# Patient Record
Sex: Male | Born: 1950 | Race: White | Hispanic: No | Smoking: Former smoker
Health system: Southern US, Community
[De-identification: ages and names within clinical notes are randomized; demographics above are authoritative.]

## PROBLEM LIST (undated history)

## (undated) DIAGNOSIS — Z974 Presence of external hearing-aid: Secondary | ICD-10-CM

## (undated) DIAGNOSIS — E662 Morbid (severe) obesity with alveolar hypoventilation: Secondary | ICD-10-CM

## (undated) DIAGNOSIS — I2510 Atherosclerotic heart disease of native coronary artery without angina pectoris: Secondary | ICD-10-CM

## (undated) DIAGNOSIS — G4733 Obstructive sleep apnea (adult) (pediatric): Secondary | ICD-10-CM

## (undated) DIAGNOSIS — I10 Essential (primary) hypertension: Secondary | ICD-10-CM

## (undated) DIAGNOSIS — K219 Gastro-esophageal reflux disease without esophagitis: Secondary | ICD-10-CM

## (undated) DIAGNOSIS — E119 Type 2 diabetes mellitus without complications: Secondary | ICD-10-CM

## (undated) DIAGNOSIS — J449 Chronic obstructive pulmonary disease, unspecified: Secondary | ICD-10-CM

## (undated) DIAGNOSIS — M109 Gout, unspecified: Secondary | ICD-10-CM

## (undated) DIAGNOSIS — I4891 Unspecified atrial fibrillation: Secondary | ICD-10-CM

## (undated) DIAGNOSIS — E785 Hyperlipidemia, unspecified: Secondary | ICD-10-CM

## (undated) DIAGNOSIS — I5032 Chronic diastolic (congestive) heart failure: Secondary | ICD-10-CM

## (undated) HISTORY — PX: BARIATRIC SURGERY: SHX1103

## (undated) HISTORY — DX: Essential (primary) hypertension: I10

## (undated) HISTORY — PX: ATRIAL ABLATION SURGERY: SHX560

## (undated) HISTORY — DX: Hyperlipidemia, unspecified: E78.5

## (undated) HISTORY — DX: Presence of external hearing-aid: Z97.4

## (undated) HISTORY — PX: HIP FRACTURE SURGERY: SHX118

## (undated) HISTORY — PX: CARDIAC CATHETERIZATION: SHX172

## (undated) HISTORY — DX: Atherosclerotic heart disease of native coronary artery without angina pectoris: I25.10

## (undated) HISTORY — PX: STOMACH SURGERY: SHX791

---

## 2004-06-24 ENCOUNTER — Emergency Department: Payer: Self-pay | Admitting: Emergency Medicine

## 2004-07-11 ENCOUNTER — Emergency Department: Payer: Self-pay | Admitting: Internal Medicine

## 2005-06-30 ENCOUNTER — Other Ambulatory Visit: Payer: Self-pay

## 2005-06-30 ENCOUNTER — Emergency Department: Payer: Self-pay | Admitting: Emergency Medicine

## 2006-08-25 ENCOUNTER — Emergency Department: Payer: Self-pay | Admitting: Emergency Medicine

## 2006-09-12 HISTORY — PX: CARDIAC CATHETERIZATION: SHX172

## 2006-12-14 ENCOUNTER — Inpatient Hospital Stay: Payer: Self-pay | Admitting: *Deleted

## 2006-12-14 ENCOUNTER — Other Ambulatory Visit: Payer: Self-pay

## 2006-12-15 ENCOUNTER — Other Ambulatory Visit: Payer: Self-pay

## 2007-06-02 ENCOUNTER — Emergency Department: Payer: Self-pay | Admitting: Emergency Medicine

## 2007-06-02 ENCOUNTER — Other Ambulatory Visit: Payer: Self-pay

## 2007-09-09 ENCOUNTER — Emergency Department: Payer: Self-pay | Admitting: Emergency Medicine

## 2007-11-06 ENCOUNTER — Other Ambulatory Visit: Payer: Self-pay

## 2007-11-07 ENCOUNTER — Other Ambulatory Visit: Payer: Self-pay

## 2007-11-07 ENCOUNTER — Inpatient Hospital Stay: Payer: Self-pay | Admitting: Internal Medicine

## 2007-11-07 ENCOUNTER — Ambulatory Visit: Payer: Self-pay | Admitting: Cardiology

## 2008-03-17 ENCOUNTER — Emergency Department: Payer: Self-pay | Admitting: Emergency Medicine

## 2008-03-17 ENCOUNTER — Other Ambulatory Visit: Payer: Self-pay

## 2008-05-15 ENCOUNTER — Emergency Department: Payer: Self-pay | Admitting: Emergency Medicine

## 2008-05-15 ENCOUNTER — Other Ambulatory Visit: Payer: Self-pay

## 2008-06-10 ENCOUNTER — Emergency Department: Payer: Self-pay | Admitting: Emergency Medicine

## 2008-06-10 ENCOUNTER — Other Ambulatory Visit: Payer: Self-pay

## 2008-06-24 ENCOUNTER — Emergency Department: Payer: Self-pay | Admitting: Emergency Medicine

## 2008-08-06 ENCOUNTER — Inpatient Hospital Stay: Payer: Self-pay | Admitting: Internal Medicine

## 2008-08-18 ENCOUNTER — Emergency Department: Payer: Self-pay | Admitting: Emergency Medicine

## 2009-02-23 DIAGNOSIS — K219 Gastro-esophageal reflux disease without esophagitis: Secondary | ICD-10-CM | POA: Insufficient documentation

## 2009-04-04 ENCOUNTER — Emergency Department: Payer: Self-pay | Admitting: Emergency Medicine

## 2009-06-01 ENCOUNTER — Emergency Department: Payer: Self-pay | Admitting: Emergency Medicine

## 2009-12-29 ENCOUNTER — Emergency Department: Payer: Self-pay | Admitting: Unknown Physician Specialty

## 2010-03-21 ENCOUNTER — Emergency Department: Payer: Self-pay | Admitting: Internal Medicine

## 2010-07-29 ENCOUNTER — Emergency Department: Payer: Self-pay | Admitting: Emergency Medicine

## 2010-08-26 ENCOUNTER — Emergency Department: Payer: Self-pay | Admitting: Emergency Medicine

## 2010-08-29 ENCOUNTER — Emergency Department: Payer: Self-pay | Admitting: Emergency Medicine

## 2010-09-12 HISTORY — PX: CHOLECYSTECTOMY: SHX55

## 2010-11-10 ENCOUNTER — Emergency Department: Payer: Medicare Other | Admitting: Emergency Medicine

## 2010-11-12 ENCOUNTER — Emergency Department: Payer: Medicare Other | Admitting: Emergency Medicine

## 2010-12-22 ENCOUNTER — Ambulatory Visit: Payer: Medicare Other | Admitting: Surgery

## 2010-12-29 ENCOUNTER — Ambulatory Visit: Payer: Medicare Other | Admitting: Surgery

## 2011-02-27 ENCOUNTER — Emergency Department: Payer: Medicare Other | Admitting: Emergency Medicine

## 2011-03-11 ENCOUNTER — Observation Stay: Payer: Medicare Other | Admitting: Internal Medicine

## 2011-04-13 DIAGNOSIS — I4891 Unspecified atrial fibrillation: Secondary | ICD-10-CM

## 2011-04-13 HISTORY — DX: Unspecified atrial fibrillation: I48.91

## 2011-05-16 ENCOUNTER — Emergency Department: Payer: Medicare Other | Admitting: Emergency Medicine

## 2011-06-14 ENCOUNTER — Ambulatory Visit: Payer: Medicare Other | Admitting: Internal Medicine

## 2011-06-14 DIAGNOSIS — R072 Precordial pain: Secondary | ICD-10-CM

## 2011-06-15 ENCOUNTER — Inpatient Hospital Stay: Payer: Medicare Other | Admitting: Internal Medicine

## 2011-06-15 DIAGNOSIS — R079 Chest pain, unspecified: Secondary | ICD-10-CM

## 2011-06-15 DIAGNOSIS — I517 Cardiomegaly: Secondary | ICD-10-CM

## 2011-06-28 ENCOUNTER — Encounter: Payer: Self-pay | Admitting: Cardiovascular Disease

## 2011-06-28 ENCOUNTER — Ambulatory Visit (INDEPENDENT_AMBULATORY_CARE_PROVIDER_SITE_OTHER): Payer: Medicare Other | Admitting: Cardiovascular Disease

## 2011-06-28 DIAGNOSIS — I10 Essential (primary) hypertension: Secondary | ICD-10-CM | POA: Insufficient documentation

## 2011-06-28 DIAGNOSIS — E669 Obesity, unspecified: Secondary | ICD-10-CM

## 2011-06-28 DIAGNOSIS — R079 Chest pain, unspecified: Secondary | ICD-10-CM | POA: Insufficient documentation

## 2011-06-28 DIAGNOSIS — E785 Hyperlipidemia, unspecified: Secondary | ICD-10-CM

## 2011-06-28 DIAGNOSIS — I2510 Atherosclerotic heart disease of native coronary artery without angina pectoris: Secondary | ICD-10-CM

## 2011-06-28 DIAGNOSIS — R609 Edema, unspecified: Secondary | ICD-10-CM

## 2011-06-28 MED ORDER — POTASSIUM CHLORIDE ER 10 MEQ PO TBCR: 10.0000 meq | EXTENDED_RELEASE_TABLET | Freq: Two times a day (BID) | ORAL | Status: DC

## 2011-06-28 MED ORDER — ENALAPRIL MALEATE 10 MG PO TABS: 10.0000 mg | ORAL_TABLET | Freq: Two times a day (BID) | ORAL | Status: DC

## 2011-06-28 NOTE — Assessment & Plan Note (Signed)
Blood pressure is elevated. We have suggested he increase his enalapril to 10 mg b.i.d..

## 2011-06-28 NOTE — Progress Notes (Signed)
Patient ID: Aaron Ball, male    DOB: Sep 07, 1951, 60 y.o.   MRN: [id number]  HPI Comments: Aaron Ball is a 60 year old gentleman with a history of coronary artery disease, stent to his RCA in the proximal region in April 2008 (cypher DES), also with residual 60% ramus disease, 40% mid left circumflex and 30% distal circumflex disease, with recent episodes of atrial fibrillation, status post ablation in March 2012 at [facility], history of obesity, abductor sleep apnea, hypertension who presented to [facility] on October 3 with chest pain.  He had gone to the surgeon who had performed his cholecystectomy earlier in the year with symptoms of diarrhea. He was noted to have elevated blood pressure. They asked him if he was having chest pain and he reported that he had very mild discomfort and they sent him to the emergency room.  His workup was essentially negative. He had had a stress test in March 2012 at [facility] or preop evaluation prior to cholecystectomy which by report showed no ischemia. He did not have a repeat stress test on his most recent admission.  Since his discharge, he has not had any further episodes of chest pain. He is currently seen at [facility] to clinic.  Echocardiogram done in the hospital shows normal LV function, mild LVH, normal right ventricular function and normal right ventricular systolic pressure  EKG shows normal sinus rhythm with rate 59 beats per minute with nonspecific ST abnormality in lead V3 through V6   Outpatient Encounter Prescriptions as of 06/28/2011  Medication Sig Dispense Refill  . aspirin 81 MG tablet Take 162 mg daily.       . diazepam (VALIUM) 10 MG tablet Take 10 mg by mouth every 6 (six) hours as needed.        . furosemide (LASIX) 40 MG tablet Take 40 mg by mouth 2 (two) times daily.        . pravastatin (PRAVACHOL) 40 MG tablet Take 40 mg by mouth daily.        . sotalol (BETAPACE) 120 MG tablet Take 120 mg by mouth 2 (two) times daily.        .  enalapril  (VASOTEC) 5 MG tablet Take 5 mg by mouth daily.           Review of Systems  Constitutional: Negative.   HENT: Negative.   Eyes: Negative.   Respiratory: Negative.   Cardiovascular: Negative.   Gastrointestinal: Negative.   Musculoskeletal: Negative.   Skin: Negative.   Neurological: Negative.   Hematological: Negative.   Psychiatric/Behavioral: Negative.   All other systems reviewed and are negative.    BP 140/90  Pulse 61  Ht 5\' 6"  (1.676 m)  Wt 320 lb (145.151 kg)  BMI 51.65 kg/m2  Physical Exam  Nursing note and vitals reviewed. Constitutional: He is oriented to person, place, and time. He appears well-developed and well-nourished.       Morbidly obese  HENT:  Head: Normocephalic.  Nose: Nose normal.  Mouth/Throat: Oropharynx is clear and moist.  Eyes: Conjunctivae are normal. Pupils are equal, round, and reactive to light.  Neck: Normal range of motion. Neck supple. No JVD present.  Cardiovascular: Normal rate, regular rhythm, S1 normal, S2 normal, normal heart sounds and intact distal pulses.  Exam reveals no gallop and no friction rub.   No murmur heard. Pulmonary/Chest: Effort normal and breath sounds normal. No respiratory distress. He has no wheezes. He has no rales. He exhibits no tenderness.  Abdominal: Soft.  Bowel sounds are normal. He exhibits no distension. There is no tenderness.  Musculoskeletal: Normal range of motion. He exhibits edema. He exhibits no tenderness.  Lymphadenopathy:    He has no cervical adenopathy.  Neurological: He is alert and oriented to person, place, and time. Coordination normal.  Skin: Skin is warm and dry. No rash noted. No erythema.  Psychiatric: He has a normal mood and affect. His behavior is normal. Judgment and thought content normal.           Assessment and Plan

## 2011-06-28 NOTE — Assessment & Plan Note (Signed)
No further episodes of chest pain. Symptoms in the hospital were somewhat atypical in nature. We have suggested that he contact our office if he has any further symptoms.

## 2011-06-28 NOTE — Assessment & Plan Note (Signed)
We have encouraged continued exercise, careful diet management in an effort to lose weight. 

## 2011-06-28 NOTE — Patient Instructions (Addendum)
Please increase the enalapril to 10 mg BID Please start potassium twice a day (10 meq) [name] for prescription changes and for a lab check of potassium in a few weeks.   Please call us if you have new issues that need to be addressed before your next appt.  The office will contact you for a follow up Appt. In 6 months

## 2011-06-28 NOTE — Assessment & Plan Note (Signed)
His cholesterol in the hospital is close to goal with LDL slightly above 70. We have suggested this be monitored every 6 months and further adjustments we made to achieve goal LDL less than 70. I hope that with a dietary guide he can decrease his weight and improve his cholesterol.

## 2011-06-28 NOTE — Assessment & Plan Note (Addendum)
He takes Lasix daily. Right ventricular systolic pressure on echo was normal suggesting his edema is from venous insufficiency. We have recommended leg elevation, TED hose or ace wrap. He is not on potassium and his potassium level was low in the hospital. We will start him on 10 meq b.i.d.. This has been called in to [facility] though he reports he would like to try and have the [facility] prescribed this.

## 2011-06-28 NOTE — Assessment & Plan Note (Signed)
History of stenting to the proximal RCA with residual ramus branch disease as well as mid left circumflex disease

## 2011-07-05 ENCOUNTER — Telehealth: Payer: Self-pay

## 2011-07-05 DIAGNOSIS — R609 Edema, unspecified: Secondary | ICD-10-CM

## 2011-07-05 MED ORDER — ENALAPRIL MALEATE 10 MG PO TABS: 10.0000 mg | ORAL_TABLET | Freq: Two times a day (BID) | ORAL | Status: DC

## 2011-07-05 NOTE — Telephone Encounter (Signed)
Refill sent for Enalapril 10 mg take one tablet twice a day.

## 2011-08-09 ENCOUNTER — Encounter: Payer: Self-pay | Admitting: Cardiovascular Disease

## 2011-08-12 ENCOUNTER — Encounter: Payer: Self-pay | Admitting: Cardiovascular Disease

## 2011-08-24 ENCOUNTER — Ambulatory Visit: Payer: Medicare Other | Admitting: Gastroenterology

## 2011-09-01 ENCOUNTER — Ambulatory Visit: Payer: Medicare Other | Admitting: Family Medicine

## 2011-09-13 HISTORY — PX: OTHER SURGICAL HISTORY: SHX169

## 2011-10-16 ENCOUNTER — Emergency Department: Payer: Self-pay | Admitting: *Deleted

## 2011-10-16 LAB — URINALYSIS, COMPLETE
Blood: NEGATIVE
Glucose,UR: NEGATIVE mg/dL (ref 0–75)
Hyaline Cast: 12
Ketone: NEGATIVE
Nitrite: NEGATIVE
Ph: 5 (ref 4.5–8.0)
Protein: NEGATIVE
WBC UR: 48 /HPF (ref 0–5)

## 2011-10-16 LAB — COMPREHENSIVE METABOLIC PANEL
BUN: 12 mg/dL (ref 7–18)
Calcium, Total: 10.8 mg/dL — ABNORMAL HIGH (ref 8.5–10.1)
Chloride: 108 mmol/L — ABNORMAL HIGH (ref 98–107)
EGFR (Non-African Amer.): >60
Glucose: 103 mg/dL — ABNORMAL HIGH (ref 65–99)
Osmolality: 289 (ref 275–301)
Potassium: 4.1 mmol/L (ref 3.5–5.1)
SGOT(AST): 59 U/L — ABNORMAL HIGH (ref 15–37)
SGPT (ALT): 65 U/L
Total Protein: 7.5 g/dL (ref 6.4–8.2)

## 2011-10-16 LAB — CBC
MCH: 31.3 pg (ref 26.0–34.0)
MCHC: 34.7 g/dL (ref 32.0–36.0)
Platelet: 170 10*3/uL (ref 150–440)
RBC: 4.57 10*6/uL (ref 4.40–5.90)
RDW: 14 % (ref 11.5–14.5)

## 2011-10-16 LAB — CK TOTAL AND CKMB (NOT AT ARMC)
CK, Total: 90 U/L (ref 35–232)
CK-MB: 1 ng/mL (ref 0.5–3.6)

## 2011-10-16 LAB — TROPONIN I: Troponin-I: <0.02 ng/mL

## 2011-10-28 ENCOUNTER — Ambulatory Visit: Payer: Self-pay

## 2011-11-29 DIAGNOSIS — E210 Primary hyperparathyroidism: Secondary | ICD-10-CM | POA: Insufficient documentation

## 2012-01-31 ENCOUNTER — Emergency Department: Payer: Self-pay | Admitting: Emergency Medicine

## 2012-01-31 LAB — URINALYSIS, COMPLETE
Glucose,UR: NEGATIVE mg/dL (ref 0–75)
Ketone: NEGATIVE
Ph: 6 (ref 4.5–8.0)
Protein: NEGATIVE
RBC,UR: 35 /HPF (ref 0–5)
WBC UR: 64 /HPF (ref 0–5)

## 2012-01-31 LAB — BASIC METABOLIC PANEL
BUN: 13 mg/dL (ref 7–18)
Co2: 26 mmol/L (ref 21–32)
Creatinine: 0.86 mg/dL (ref 0.60–1.30)
EGFR (African American): >60
EGFR (Non-African Amer.): >60
Glucose: 116 mg/dL — ABNORMAL HIGH (ref 65–99)
Potassium: 4.3 mmol/L (ref 3.5–5.1)

## 2012-01-31 LAB — CBC
HCT: 41.1 % (ref 40.0–52.0)
MCH: 30.5 pg (ref 26.0–34.0)
MCV: 92 fL (ref 80–100)
RBC: 4.47 10*6/uL (ref 4.40–5.90)
RDW: 13.9 % (ref 11.5–14.5)

## 2012-02-01 LAB — URINE CULTURE

## 2012-03-27 DIAGNOSIS — N202 Calculus of kidney with calculus of ureter: Secondary | ICD-10-CM | POA: Insufficient documentation

## 2012-06-27 ENCOUNTER — Emergency Department: Payer: Self-pay | Admitting: Emergency Medicine

## 2013-01-03 ENCOUNTER — Emergency Department: Payer: Self-pay | Admitting: Emergency Medicine

## 2013-01-03 LAB — CBC
HGB: 13.3 g/dL (ref 13.0–18.0)
RBC: 4.29 10*6/uL — ABNORMAL LOW (ref 4.40–5.90)
WBC: 5.9 10*3/uL (ref 3.8–10.6)

## 2013-01-03 LAB — BASIC METABOLIC PANEL
Anion Gap: 6 — ABNORMAL LOW (ref 7–16)
BUN: 14 mg/dL (ref 7–18)
Calcium, Total: 8.2 mg/dL — ABNORMAL LOW (ref 8.5–10.1)
Chloride: 105 mmol/L (ref 98–107)
EGFR (African American): >60
EGFR (Non-African Amer.): >60
Glucose: 131 mg/dL — ABNORMAL HIGH (ref 65–99)
Potassium: 3.6 mmol/L (ref 3.5–5.1)

## 2013-01-03 LAB — PRO B NATRIURETIC PEPTIDE: B-Type Natriuretic Peptide: 38 pg/mL (ref 0–125)

## 2013-01-03 LAB — CK TOTAL AND CKMB (NOT AT ARMC): CK, Total: 215 U/L (ref 35–232)

## 2013-02-16 LAB — PRO B NATRIURETIC PEPTIDE: B-Type Natriuretic Peptide: 75 pg/mL (ref 0–125)

## 2013-02-16 LAB — CBC
HCT: 36.9 % — ABNORMAL LOW (ref 40.0–52.0)
HGB: 12.5 g/dL — ABNORMAL LOW (ref 13.0–18.0)
MCH: 30.4 pg (ref 26.0–34.0)
MCHC: 34 g/dL (ref 32.0–36.0)
RBC: 4.13 10*6/uL — ABNORMAL LOW (ref 4.40–5.90)
RDW: 14.2 % (ref 11.5–14.5)

## 2013-02-16 LAB — COMPREHENSIVE METABOLIC PANEL
Alkaline Phosphatase: 94 U/L (ref 50–136)
Anion Gap: 7 (ref 7–16)
BUN: 16 mg/dL (ref 7–18)
Chloride: 108 mmol/L — ABNORMAL HIGH (ref 98–107)
Creatinine: 1.12 mg/dL (ref 0.60–1.30)
EGFR (African American): >60
EGFR (Non-African Amer.): >60
Glucose: 106 mg/dL — ABNORMAL HIGH (ref 65–99)
Osmolality: 283 (ref 275–301)
SGOT(AST): 26 U/L (ref 15–37)
SGPT (ALT): 46 U/L (ref 12–78)
Sodium: 141 mmol/L (ref 136–145)
Total Protein: 7.2 g/dL (ref 6.4–8.2)

## 2013-02-16 LAB — CK TOTAL AND CKMB (NOT AT ARMC)
CK, Total: 171 U/L (ref 35–232)
CK-MB: 0.8 ng/mL (ref 0.5–3.6)

## 2013-02-16 LAB — TROPONIN I: Troponin-I: <0.02 ng/mL

## 2013-02-17 ENCOUNTER — Observation Stay: Payer: Self-pay | Admitting: Internal Medicine

## 2013-02-17 DIAGNOSIS — R079 Chest pain, unspecified: Secondary | ICD-10-CM

## 2013-02-17 DIAGNOSIS — I517 Cardiomegaly: Secondary | ICD-10-CM

## 2013-02-17 DIAGNOSIS — R0602 Shortness of breath: Secondary | ICD-10-CM

## 2013-02-17 LAB — TROPONIN I: Troponin-I: <0.02 ng/mL

## 2013-02-17 LAB — CK-MB: CK-MB: 0.8 ng/mL (ref 0.5–3.6)

## 2013-02-18 DIAGNOSIS — R079 Chest pain, unspecified: Secondary | ICD-10-CM

## 2013-02-18 LAB — CBC WITH DIFFERENTIAL/PLATELET
Basophil #: 0.1 10*3/uL (ref 0.0–0.1)
Eosinophil #: 0.2 10*3/uL (ref 0.0–0.7)
Eosinophil %: 5 %
HGB: 12.4 g/dL — ABNORMAL LOW (ref 13.0–18.0)
Lymphocyte %: 19.9 %
MCH: 30.8 pg (ref 26.0–34.0)
MCHC: 34.2 g/dL (ref 32.0–36.0)
MCV: 90 fL (ref 80–100)
Monocyte #: 0.4 x10 3/mm (ref 0.2–1.0)
Neutrophil #: 2.9 10*3/uL (ref 1.4–6.5)
Neutrophil %: 64.3 %
Platelet: 202 10*3/uL (ref 150–440)
RBC: 4.04 10*6/uL — ABNORMAL LOW (ref 4.40–5.90)
RDW: 14 % (ref 11.5–14.5)
WBC: 4.6 10*3/uL (ref 3.8–10.6)

## 2013-02-18 LAB — BASIC METABOLIC PANEL
Anion Gap: 7 (ref 7–16)
Calcium, Total: 8.3 mg/dL — ABNORMAL LOW (ref 8.5–10.1)
Chloride: 101 mmol/L (ref 98–107)
Creatinine: 1.12 mg/dL (ref 0.60–1.30)
EGFR (African American): >60
EGFR (Non-African Amer.): >60
Osmolality: 275 (ref 275–301)
Potassium: 4 mmol/L (ref 3.5–5.1)
Sodium: 137 mmol/L (ref 136–145)

## 2013-02-18 LAB — LIPID PANEL: HDL Cholesterol: 33 mg/dL — ABNORMAL LOW (ref 40–60)

## 2013-02-18 LAB — MAGNESIUM: Magnesium: 2 mg/dL

## 2013-07-09 DIAGNOSIS — R0602 Shortness of breath: Secondary | ICD-10-CM | POA: Insufficient documentation

## 2014-11-11 DIAGNOSIS — R600 Localized edema: Secondary | ICD-10-CM | POA: Insufficient documentation

## 2014-11-11 DIAGNOSIS — M10071 Idiopathic gout, right ankle and foot: Secondary | ICD-10-CM | POA: Insufficient documentation

## 2014-11-14 ENCOUNTER — Emergency Department: Payer: Self-pay | Admitting: Student

## 2015-01-02 NOTE — Discharge Summary (Signed)
PATIENT NAME:  Sharion SettlerHILL, Griffin T MR#:  [id number] DATE OF BIRTH:  1951/07/02  DATE OF ADMISSION:  02/17/2013 DATE OF DISCHARGE:  02/18/2013  ADMITTING PHYSICIAN: Ramonita LabAruna Gouru, MD   DISCHARGING PHYSICIAN: Enid Baasadhika Daren Yeagle, MD  PRIMARY CARE PHYSICIAN: [facility]ne [facility].  PRIMARY CARDIOLOGIST: Dr. [facility]rey Klein at [facility].   CONSULTATIONS IN THE HOSPITAL: Cardiology consultation with Dr. Julien Nordmannimothy Gollan and Dr. Kirke CorinArida.   DISCHARGE DIAGNOSES: 1.  Stable angina.  2.  Acute on chronic congestive heart failure with diastolic dysfunction.  3.  Hypertension.  4.  Hyperlipidemia.  5.  Obesity.  6.  Sleep apnea.   DISCHARGE MEDICATIONS:  1.  Ciprofloxacin 250 mg p.o. daily for chronic UTIs.  2.  Enalapril 5 mg p.o. b.i.d.  3.  Sublingual nitroglycerin 0.4 mg every 5 minutes as needed for chest pain.  4.  Trazodone 100 mg p.o. daily.  5.  Lovastatin 20 mg p.o. daily.  6.  Aspirin 325 mg p.o. daily.  7.  Metoprolol 12.5 mg p.o. b.i.d.  8.  Amlodipine 5 mg p.o. daily.  9.  Lasix 40 mg p.o. b.i.d.  10.  Azelastine nasal spray q.12 hours.   DISCHARGE DIET: Low-sodium diet.   DISCHARGE ACTIVITY: As tolerated.    FOLLOWUP INSTRUCTIONS: 1.  [facility], Dr. Angelita InglesJeffrey Klein, for outpatient stress test in next 1 to 2 weeks.  2.  PCP followup in 2 weeks.   LABORATORIES AND IMAGING STUDIES PRIOR TO DISCHARGE: LDL 70, HDL cholesterol 33, triglycerides 206 and total cholesterol 144.   WBC 4.6, hemoglobin 12.4, hematocrit 36.4, and platelet count is 202.   Sodium 137, potassium 4.0, chloride 101, bicarbonate 29, BUN 14, creatinine 1.1, glucose 109, and calcium of 8.3, magnesium 2.0.   Troponin is negative in the hospital.   Echo Doppler showing normal global LV systolic function, left ventricular hypertrophy is present, EF is 55% to 60% and diastolic dysfunction.   Chest x-ray on admission showing stable chest with no acute abnormality.   BNP was within  normal limits on admission.  BRIEF HOSPITAL COURSE: Mr. Loleta ChanceHill is an obese 64 year old Caucasian male with past medical history significant for hypertension, coronary artery disease (last stress test about 1 year ago was negative), atrial fibrillation with [facility] (currently in sinus rhythm), sleep apnea, chronic lower extremity edema and obesity, comes to the hospital secondary to chest pain and bilateral worsening pedal edema.  1.  Chest pain: Likely stable angina worsened by his CHF and diastolic dysfunction. His last stress test was about 1 year ago and he does have risk factors for coronary artery disease, so a stress test was recommended because the patient has been chest pain-free, troponins were negative and he wished to follow up with his cardiologist for an outpatient stress test, which will be scheduled prior to discharge. The patient is on cardiac medications including metoprolol, aspirin and statin, which were continued in the hospital.  2.  Diastolic CHF with EF 55% to 60%.: Chest x-ray was clear, but does have some pedal edema and his symptoms have improved after diuresing. He was on 40 mg b.i.d. at home, which was increased to 40 mg q.6 hours, and the patient's dyspnea and chest pain improved after that. The likely cause for his diastolic CHF is his obstructive sleep apnea, and the patient has been noncompliant with his CPAP over the last few days. The patient is discharged back on his Lasix b.i.d. dose at this time  and appears to be stable.  3.  Hypertension: Home medications were continued. He is on enalapril, metoprolol and also Norvasc. 4.  Sleep apnea: Nasal spray was given for congestion and he was advised to restart using his CPAP at home.   His course has been otherwise uneventful in the hospital.   DISCHARGE CONDITION: Stable.   DISCHARGE DISPOSITION: Home.   TIME SPENT ON DISCHARGE: 45 minutes.    ____________________________ Enid Baas,  MD rk:jm D: 02/18/2013 16:06:42 ET T: 02/18/2013 18:14:37 ET JOB#: [id number]  cc: Enid Baas, MD, <Dictator> Angus Palms, MD Dr. Angelita Ingles, [facility] Cardiology Enid Baas MD ELECTRONICALLY SIGNED 03/04/2013 13:47

## 2015-01-02 NOTE — Consult Note (Signed)
General Aspect 64 year old male with history of coronary artery disease, stent several years ago, obesity, obstructive sleep apnea, hypertension, hx of atrial fibrillation, status post ablation March 2012 at [facility], hx of diastolic CHF on lasix 40 mg po BID,  who presents with chest discomfort adn worsening shortness of breath. Cardiology was consulted for evaluation of chest pain sx and SOB.  This weekend, he reports walking around the hospital to see a family member. He had to walk a long way from the parking lot to the other side of the hospital. He was very short of breath, some mild chest discomfort that relieved with rest. He did the same walking the next day with no chest pain, only SOB. He has been sedentary through the winter and states that he has been in bad conditioning. Has not work cpap the past few days secondary to nasal congestion/stuffiness. He reports worsning lower extremity edema the past few days, worse than normal. Also with gout. He reports lasix 40 mg po BID has not been working for him, minimal urination.   he has a cardiologist at [facility]) He had a stress test in March 2012 at [facility] for preop evaluation for cholecystectomy, performed in March.   cardiac enzymes have been negative this admission. No significant EKG abnormality noted.   Present Illness . PAST SURGICAL HISTORY:  1. Cholecystectomy in 03/12.  2. Ablation in August 2012.  3. Stent in the RCA.   SOCIAL HISTORY: Patient lives with his son. No tobacco, alcohol or IV drug use.   FAMILY HISTORY: Positive for hypertension and coronary artery disease.   Physical Exam:  GEN well developed, well nourished   HEENT hearing intact to voice, moist oral mucosa   NECK supple  No masses   RESP normal resp effort  clear BS  no use of accessory muscles   CARD Regular rate and rhythm  Normal, S1, S2  No murmur   ABD denies tenderness  soft   LYMPH negative neck   EXTR positive edema, trace to 1 + pitting of  the LE   SKIN normal to palpation   NEURO cranial nerves intact, motor/sensory function intact   PSYCH alert, A+O to time, place, person, good insight   Review of Systems:  Subjective/Chief Complaint SOB, chest tightness with walking and SOB on Saturday, none since then   General: No Complaints   Skin: No Complaints   ENT: No Complaints   Eyes: No Complaints   Neck: No Complaints   Respiratory: Short of breath   Cardiovascular: Chest pain or discomfort  Dyspnea   Gastrointestinal: No Complaints   Genitourinary: No Complaints   Vascular: No Complaints   Musculoskeletal: No Complaints   Neurologic: No Complaints   Hematologic: No Complaints   Endocrine: No Complaints   Psychiatric: No Complaints   Review of Systems: All other systems were reviewed and found to be negative   Medications/Allergies Reviewed Medications/Allergies reviewed       Hard of hearing:    Gastric Reflux:    Arrythmias:    CAD:    Atrial Fibrillation:    Sleep Apnea:    Anxiety:    afib:    htn:    gerd:    hearing loss:    Cholecystectomy:    Hernia Repair: umbilical   Tonsillectomy:    Vasectomy:    Rt. Hip surgery:    Stent - Cardiac x 5:    cardiac ablation:   Home Medications:  Medication Instructions  Status  ciprofloxacin 250 mg oral tablet 1 tab(s) orally once a day for UTI for 30 days Active  furosemide 40 mg oral tablet 1 tab(s) orally 2 times a day Active  metoprolol tartrate 25 mg oral tablet 0.5 tab(s) orally 2 times a day Active  enalapril 5 mg oral tablet 1 tab(s) orally 2 times a day Active  lovastatin 20 mg oral tablet 1 tab(s) orally once a day with evening meal Active  traZODone 100 mg oral tablet 1 tab(s) orally once a day (at bedtime) Active  amLODIPine 5 mg oral tablet 1 tab(s) orally once a day Active  aspirin 325 mg oral tablet 1 tab(s) orally once a day Active   Lab Results:  Hepatic:  07-Jun-14 22:04   Bilirubin, Total 0.3   Alkaline Phosphatase 94  SGPT (ALT) 46  SGOT (AST) 26  Total Protein, Serum 7.2  Albumin, Serum 3.4  Routine Chem:  07-Jun-14 22:04   Glucose, Serum  106  BUN 16  Creatinine (comp) 1.12  Sodium, Serum 141  Potassium, Serum 3.7  Chloride, Serum  108  CO2, Serum 26  Calcium (Total), Serum  8.1  Osmolality (calc) 283  eGFR (African American) >60  eGFR (Non-African American) >60 (eGFR values <30m/min/1.73 m2 may be an indication of chronic kidney disease (CKD). Calculated eGFR is useful in patients with stable renal function. The eGFR calculation will not be reliable in acutely ill patients when serum creatinine is changing rapidly. It is not useful in  patients on dialysis. The eGFR calculation may not be applicable to patients at the low and high extremes of body sizes, pregnant women, and vegetarians.)  Anion Gap 7  B-Type Natriuretic Peptide ([facility]) 75 (Result(s) reported on 16 Feb 2013 at 10:33PM.)  Cardiac:  07-Jun-14 22:04   CPK-MB, Serum 0.8 (Result(s) reported on 16 Feb 2013 at 10:33PM.)  Troponin I < 0.02 (0.00-0.05 0.05 ng/mL or less: NEGATIVE  Repeat testing in 3-6 hrs  if clinically indicated. >0.05 ng/mL: POTENTIAL  MYOCARDIAL INJURY. Repeat  testing in 3-6 hrs if  clinically indicated. NOTE: An increase or decrease  of 30% or more on serial  testing suggests a  clinically important change)  CK, Total 171  08-Jun-14 06:21   Troponin I < 0.02 (0.00-0.05 0.05 ng/mL or less: NEGATIVE  Repeat testing in 3-6 hrs  if clinically indicated. >0.05 ng/mL: POTENTIAL  MYOCARDIAL INJURY. Repeat  testing in 3-6 hrs if  clinically indicated. NOTE: An increase or decrease  of 30% or more on serial  testing suggests a  clinically important change)  Routine Hem:  07-Jun-14 22:04   WBC (CBC) 6.8  RBC (CBC)  4.13  Hemoglobin (CBC)  12.5  Hematocrit (CBC)  36.9  Platelet Count (CBC) 220 (Result(s) reported on 16 Feb 2013 at 10:18PM.)  MCV 89  MCH 30.4  MCHC  34.0  RDW 14.2   EKG:  Interpretation EKG shows NSR with rate of 64 bpm, no significant ST or T wave changes   Radiology Results:  XRay:    07-Jun-14 22:18, Chest PA and Lateral  Chest PA and Lateral   REASON FOR EXAM:    SOB  COMMENTS:       PROCEDURE: DXR - DXR CHEST PA (OR AP) AND LATERAL  - Feb 16 2013 10:18PM     RESULT: Comparison: None    Findings:     PA and lateral chest radiographs are provided.  There is no focal   parenchymal opacity, pleural effusion, or  pneumothorax. The heart and   mediastinum are unremarkable.  The osseous structures are unremarkable.    IMPRESSION:   No acute disease of the chest.    Dictation Site: 1        Verified By: Jennette Banker, M.D., MD    No Known Allergies:   Vital Signs/Nurse's Notes:  **Vital Signs.:   08-Jun-14 11:23  Vital Signs Type Q 4hr  Temperature Temperature (F) 97.6  Celsius 36.4  Temperature Source oral  Pulse Pulse 57  Respirations Respirations 18  Systolic BP Systolic BP 372  Diastolic BP (mmHg) Diastolic BP (mmHg) 72  Mean BP 98  Pulse Ox % Pulse Ox % 99  Pulse Ox Activity Level  At rest  Oxygen Delivery Room Air/ 21 %    Impression 64 year old male with history of coronary artery disease, stent several years ago, obesity, obstructive sleep apnea, hypertension, atrial fibrillation, status post ablation March 2012 at [facility], hx of diastolic CHF, managed on lasix po BID,  who presents with chest discomfort, worsening SOB, edema.     A/P 1)chest pain Typical anginal sx.  EKG is essentially normal Cardiac enzymes are negative x2 so far He reports that his symptoms were  mild. He feels it is from SOB and fluid.  Prior to this wekend, was having no sx. He has indicated he would like to hold off on workup at this time and if sx recur, he would follow up wityh his outpt cardiologist. --Will monitor third troponin --Would d/c with NTG sl --Follow up with his outpt cardiology   2)Shortness of  breath Suspect multifactorial, morbid obesity, deconditioned, diastolic CHF, unable to exclude angina missed several days of CPAP Less likely PE, Low BNP Could try several doses of lasix IV --Change to torsemide 40 mg po BID at discharge with follow up with outpt cardiology  3)h/o atrial fibrillation, ablation in NSR Would continue outpt meds.  4)OSA/ obesity hypoventilation Needs outpt discussion of weight. Compliance with CPAP   Electronic Signatures: Ida Rogue (MD)  (Signed 08-Jun-14 14:05)  Authored: General Aspect/Present Illness, History and Physical Exam, Review of System, Past Medical History, Home Medications, Labs, EKG , Radiology, Allergies, Vital Signs/Nurse's Notes, Impression/Plan   Last Updated: 08-Jun-14 14:05 by Ida Rogue (MD)

## 2015-01-02 NOTE — H&P (Signed)
PATIENT NAME:  Aaron Ball, Aaron Ball MR#:  [id number] DATE OF BIRTH:  11/12/1950  DATE OF ADMISSION:  02/17/2013  PRIMARY CARE PHYSICIAN: Dr. Angus Palms  REFERRING PHYSICIAN: Dr. Brandt Loosen  CHIEF COMPLAINT:  Chest pain, right foot pain and swelling.   HISTORY OF PRESENT ILLNESS:  The patient is a 64 year old Caucasian male with a past medical history of chronic atrial fibrillation status post ablation, currently not on anticoagulation, history of coronary artery disease status post stent in the past, hypertension, obstructive sleep apnea on CPAP at bedtime and GERD, is presenting to the ER with a chief complaint of 2 day history of intermittent episodes of chest pain and worsening of right lower extremity edema. The patient has chronic history of lower extremity edema and on Lasix, but reported that the right-sided lower extremity edema has gotten worse associated with the pain. The patient also complained of chest pain associated with shortness of breath for 2 days, which was intermittent in nature. The patient has reported that his chest pain is midsternal, lasting for a few seconds. It is pressure-like in sensation and getting worse with minimal exertion. Unable to walk more than 1 block without developing chest pain and shortness of breath. Denies any nausea, vomiting but felt a little dizzy. Denies any loss of consciousness. The patient's previous cardiac cath was done 10 to 12 years ago and last stress test was done in April 2013 by Dr. Mariah Milling, which was apparently normal. The patient was given aspirin, nitroglycerin and beta blocker. During my examination, the patient was chest pain free. He is hard of hearing. No family members are at bedside.   PAST MEDICAL HISTORY:  1.  Coronary artery disease status post stent, previous last stress test was in April of 2013 which was normal.  2.  History of atrial fibrillation with a recent ablation at [facility] on 04/30/2011. Currently, back to sinus rhythm, not on  anticoagulation.  3.  Hypertension.  4.  Obstructive sleep apnea, on CPAP at bedtime. 5.  Neuropathy. 6.  GERD.  7.  History of gout. 8.   Chronic lower extremity edema. 9.   Morbid obesity.   PAST SURGICAL HISTORY:  1.  Cardiac stent placement. 2.  Cardiac ablation in August of 2012 for atrial fibrillation. 3.  Cholecystectomy in March of 2012.   ALLERGIES:  No known drug allergies.    HOME MEDICATIONS:  1.  Trazodone 100  mg once daily. 2.  Metoprolol tartrate 25 mg 1/2 tablet 2 times a day.  3.  Amlodipine 5 mg once daily.  4.  Furosemide 40 mg twice a day. 5.  Enalapril 5 mg twice a day. 6.  Ciprofloxacin 250 mg once daily for recurrent UTIs for prophylaxis.   PSYCHOSOCIAL HISTORY:  Lives alone. He used to smoke, but quit smoking 10 years ago. Denies any alcohol or illicit drug usage.   FAMILY HISTORY:  Hypertension and coronary artery disease runs in his family.   REVIEW OF SYSTEMS: CONSTITUTIONAL: Denies any fever, fatigue, weakness. Complaining of chest pain with minimal exertion. Reported weight gain, but he did not measure.  EYES: Denies any blurry vision, glaucoma, cataracts.  ENT: Denies any ear pain or tinnitus, epistaxis. He has history of hearing loss. Denies any sinus pain.  RESPIRATIONS: Denies any cough. Has history of obstructive sleep apnea. Denies any history of hemoptysis or wheezing.  CARDIOVASCULAR: Intermittent episodes of chest pain and getting worse with minimal exertion. Denies any palpitations, feeling dizzy, but no syncope.  GASTROINTESTINAL:  Denies nausea, vomiting, diarrhea. Has history of GERD.  GENITOURINARY: No dysuria, hematuria. Denies any polyuria, nocturia.  ENDOCRINE: Denies any thyroid problems.  HEMATOLOGIC AND LYMPHATIC:  Denies any easy bruising or bleeding.   INTEGUMENTARY: No acne, rash, lesions.  MUSCULOSKELETAL: No joint pain in the neck, back, shoulder. Has chronic history of gout, complaining of pain in the right foot  associated with swelling.  NEUROLOGIC: Denies any vertigo, ataxia, dementia. No history of stroke.  PSYCHIATRIC: Denies insomnia, ADD, OCD, bipolar disorder.   PHYSICAL EXAMINATION: VITAL SIGNS: Temperature 98.2, pulse 61, respirations 22, blood pressure 123/56, pulse of 97% on 2 liters.  GENERAL APPEARANCE: Not in any acute distress, morbidly obese.  HEENT: Normocephalic, atraumatic. Pupils are equal, reacting light and accommodation. No scleral icterus. No conjunctival injection. No sinus tenderness. No postnasal drip. No pharyngeal exudate.  NECK:  Supple. Range of motion is intact. No neck stiffness. No JVD. No thyromegaly. No lymphadenopathy.  LUNGS: Clear to auscultation bilaterally. No accessory muscle usage. No anterior chest wall tenderness on palpation.  CARDIAC: S1, S2 normal. Regular rate and rhythm. No murmurs. No gallops.  GASTROINTESTINAL: Soft. Bowel sounds are positive in all 4 quadrants. Obese, nontender, nondistended. No masses felt. No hepatosplenomegaly.  NEUROLOGIC: Awake, alert, oriented x 3. Cranial nerves II through XII are intact. Motor sensory and are intact. Reflexes are 2+.  EXTREMITIES: Chronic lower extremity edema is present, which is 1+. Second toe of the right foot is tender, edematous and erythematous.  SKIN: Normal turgor. Warm to touch. No rashes. No lesions.  EXTREMITIES: No cyanosis. No clubbing, 1+ pitting edema is present.  PSYCHIATRIC: Normal mood and affect.   LABORATORY AND IMAGING STUDIES:  A 12-lead EKG, normal sinus rhythm at 64 beats per minute, normal PR interval and normal QRS duration. No ST-T elevations are noticed.   Chest x-ray: No acute findings.   LFTs are normal. CK total was 171. CK-MB 8.8, troponin less than 0.02. WBC 6.8, hemoglobin 12.5, hematocrit 36.9, platelets 220. Glucose 106. BNP 75.  BUN 16, creatinine 1.1, sodium 141, potassium 3.7, chloride 108, CO2 26, GFR greater than 60. Anion gap 7. Serum osmolality 280. Calcium 8.1.    ASSESSMENT AND PLAN:  A 64 year old Caucasian male presenting to the ER with a chief complaint of 2 day history of intermittent episodes of chest pain getting worse with minimal exertion and worsening of right lower extremity edema and pain. He will be admitted with the following assessment and plan.   1.  Unstable angina. First set of troponin is negative and recent stress test in April 2013 is negative, with history of coronary artery disease status post stent in the past. We will admit him to telemetry. We will implement ACS protocol. We will cycle cardiac biomarkers. 2.  The patient will be on oxygen, nitroglycerin as needed, aspirin, beta blocker and statin.  3.  Cardiology consult is placed to Dr. Mariah Milling. We will get an echocardiogram done.  4.  Worsening of the right lower extremity edema with pain on chronic bilateral lower extremity edema, is probably from acute exacerbation of the gout. The patient was given colchicine 1 dose in the ER and we will give another 0.6 mg x 1 and will continue once daily during the acute exacerbation. We will provide him morphine as needed basis for pain.  5.  Obstructive sleep apnea. Continue CPAP at bedtime.  6.  Hypertension. Resume his home medications and titrate as needed.  7.  Hyperlipidemia. Continue statin and check fasting  lipid panel.  8.  Gastroesophageal reflux disease, provide him Prilosec.  9.  Chronic history of atrial fibrillation, was status post ablation, now back to sinus.  10.   Chronic bilateral lower extremity edema,  resume his home medication of Lasix.  11.   We will provide him gastrointestinal and deep vein thrombosis prophylaxis.  12.  Full code.  The diagnosis and plan of care was discussed in detail with the patient. He is aware of the plan. Total time spent on admission is 50 minutes.     ____________________________ Ramonita LabAruna Mackenize Delgadillo, MD ag:nts D: 02/17/2013 05:28:40 ET T: 02/17/2013 06:09:58 ET JOB#: [id number]  cc: Ramonita LabAruna  Jafeth Mustin, MD, <Dictator> Antonieta Ibaimothy J. Gollan, MD Angus PalmsSionne George, MD     Ramonita LabARUNA Frenchie Pribyl MD ELECTRONICALLY SIGNED 02/20/2013 5:09

## 2015-02-02 ENCOUNTER — Ambulatory Visit: Payer: Medicare Other | Admitting: Podiatry

## 2015-02-22 DIAGNOSIS — L989 Disorder of the skin and subcutaneous tissue, unspecified: Secondary | ICD-10-CM | POA: Insufficient documentation

## 2015-07-10 ENCOUNTER — Encounter: Payer: Self-pay | Admitting: Emergency Medicine

## 2015-07-10 ENCOUNTER — Emergency Department
Admission: EM | Admit: 2015-07-10 | Discharge: 2015-07-11 | Disposition: A | Discharge status: Home / Self Care | Payer: Medicare Other | Attending: Emergency Medicine | Admitting: Emergency Medicine

## 2015-07-10 ENCOUNTER — Emergency Department: Payer: Medicare Other

## 2015-07-10 DIAGNOSIS — E119 Type 2 diabetes mellitus without complications: Secondary | ICD-10-CM | POA: Diagnosis not present

## 2015-07-10 DIAGNOSIS — I10 Essential (primary) hypertension: Secondary | ICD-10-CM | POA: Insufficient documentation

## 2015-07-10 DIAGNOSIS — Z79899 Other long term (current) drug therapy: Secondary | ICD-10-CM | POA: Diagnosis not present

## 2015-07-10 DIAGNOSIS — M25551 Pain in right hip: Secondary | ICD-10-CM | POA: Diagnosis present

## 2015-07-10 DIAGNOSIS — Z87891 Personal history of nicotine dependence: Secondary | ICD-10-CM | POA: Insufficient documentation

## 2015-07-10 DIAGNOSIS — Z7982 Long term (current) use of aspirin: Secondary | ICD-10-CM | POA: Diagnosis not present

## 2015-07-10 MED ORDER — OXYCODONE-ACETAMINOPHEN 5-325 MG PO TABS
1.0000 | ORAL_TABLET | Freq: Four times a day (QID) | ORAL | Status: DC | PRN
Start: 2015-07-10 — End: 2015-10-08

## 2015-07-10 MED ORDER — OXYCODONE-ACETAMINOPHEN 5-325 MG PO TABS
1.0000 | ORAL_TABLET | Freq: Once | ORAL | Status: AC
  Administered 2015-07-10: 1 via ORAL
  Filled 2015-07-10: qty 1

## 2015-07-10 NOTE — ED Notes (Signed)
Pt. States rt. Non-tramatic hip pain that started yesterday.  Pt. Starts rt. Hip surgery in 1983.

## 2015-07-10 NOTE — Discharge Instructions (Signed)
Joint Pain Joint pain, which is also called arthralgia, can be caused by many things. Joint pain often goes away when you follow your health care provider's instructions for relieving pain at home. However, joint pain can also be caused by conditions that require further treatment. Common causes of joint pain include:  Bruising in the area of the joint.  Overuse of the joint.  Wear and tear on the joints that occur with aging (osteoarthritis).  Various other forms of arthritis.  A buildup of a crystal form of uric acid in the joint (gout).  Infections of the joint (septic arthritis) or of the bone (osteomyelitis). Your health care provider may recommend medicine to help with the pain. If your joint pain continues, additional tests may be needed to diagnose your condition. HOME CARE INSTRUCTIONS Watch your condition for any changes. Follow these instructions as directed to lessen the pain that you are feeling.  Take medicines only as directed by your health care provider.  Rest the affected area for as long as your health care provider says that you should. If directed to do so, raise the painful joint above the level of your heart while you are sitting or lying down.  Do not do things that cause or worsen pain.  If directed, apply ice to the painful area:  Put ice in a plastic bag.  Place a towel between your skin and the bag.  Leave the ice on for 20 minutes, 2-3 times per day.  Wear an elastic bandage, splint, or sling as directed by your health care provider. Loosen the elastic bandage or splint if your fingers or toes become numb and tingle, or if they turn cold and blue.  Begin exercising or stretching the affected area as directed by your health care provider. Ask your health care provider what types of exercise are safe for you.  Keep all follow-up visits as directed by your health care provider. This is important. SEEK MEDICAL CARE IF:  Your pain increases, and medicine  does not help.  Your joint pain does not improve within 3 days.  You have increased bruising or swelling.  You have a fever.  You lose 10 lb (4.5 kg) or more without trying. SEEK IMMEDIATE MEDICAL CARE IF:  You are not able to move the joint.  Your fingers or toes become numb or they turn cold and blue.   This information is not intended to replace advice given to you by your health care provider. Make sure you discuss any questions you have with your health care provider.   Document Released: 08/29/2005 Document Revised: 09/19/2014 Document Reviewed: 06/10/2014 Elsevier Interactive Patient Education 2016 [employer]   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 This indicates loosening of your hardware. Please ambulate with a walker at all times. Follow-up with orthopedics first of next week.

## 2015-07-11 NOTE — ED Provider Notes (Signed)
CSN: [id number]     Arrival date & time 07/10/15  2119 History   First MD Initiated Contact with Patient 07/10/15 2239     Chief Complaint  Patient presents with  . Hip Pain    Pt. states rt. hip pain that started yesterday morning.  Pt. denies tramatic injury.     (Consider location/radiation/quality/duration/timing/severity/associated sxs/prior Treatment) HPI  64 year old male presents to the emergency department for evaluation of right hip pain. He has a history of right hip ORIF performed by Dr. Hyacinth Meeker in 1983. Patient states he woke up one day ago with right hip pain that is increased with right hip movement. He has mild pain with ambulation. No pain with sitting. He denies any numbness tingling or radiation of the pain. Pain is located in his right groin. He denies any weakness or loss of bowel or bladder symptoms. He has tried Aleve without relief. Past Medical History  Diagnosis Date  . Coronary artery disease     s/p stent in RCA.  He has six stents.  . Hypertension   . Arrhythmia 04/2011    s/p A-fib ablation.  . Hyperlipidemia   . Wears hearing aid     right ear  . Diabetes mellitus without complication [facility])    Past Surgical History  Procedure Laterality Date  . Atrial ablation surgery      A-Fib  . Cholecystectomy  2012  . Cardiac catheterization      s/p stent to RCA.   . Cardiac catheterization  2008    Dr. Juliann Pares @ [facility] .  [name] Hip fracture surgery    . Heart stint  2013   Family History  Problem Relation Age of Onset  . Heart disease Father   . Heart attack Father    Social History  Substance Use Topics  . Smoking status: Former Smoker -- 1.00 packs/day for 30 years    Types: Cigarettes    Quit date: 06/28/2003  . Smokeless tobacco: None  . Alcohol Use: No    Review of Systems  Constitutional: Negative.  Negative for fever, chills, activity change and appetite change.  HENT: Negative for congestion, ear pain, mouth sores, rhinorrhea, sinus  pressure, sore throat and trouble swallowing.   Eyes: Negative for photophobia, pain and discharge.  Respiratory: Negative for cough, chest tightness and shortness of breath.   Cardiovascular: Negative for chest pain and leg swelling.  Gastrointestinal: Negative for nausea, vomiting, abdominal pain, diarrhea and abdominal distention.  Genitourinary: Negative for dysuria and difficulty urinating.  Musculoskeletal: Positive for arthralgias. Negative for back pain and gait problem.  Skin: Negative for color change and rash.  Neurological: Negative for dizziness and headaches.  Hematological: Negative for adenopathy.  Psychiatric/Behavioral: Negative for behavioral problems and agitation.      Allergies  Review of patient's allergies indicates no known allergies.  Home Medications   Prior to Admission medications   Medication Sig Start Date End Date Taking? Authorizing Provider  aspirin 81 MG tablet Take 162 mg daily.     Historical Provider, MD  diazepam (VALIUM) 10 MG tablet Take 10 mg by mouth every 6 (six) hours as needed.      Historical Provider, MD  enalapril (VASOTEC) 10 MG tablet Take 1 tablet (10 mg total) by mouth 2 (two) times daily. 07/05/11   Antonieta Iba, MD  furosemide (LASIX) 40 MG tablet Take 40 mg by mouth 2 (two) times daily.      Historical Provider, MD  oxyCODONE-acetaminophen (ROXICET) 5-325  MG tablet Take 1 tablet by mouth every 6 (six) hours as needed. 07/10/15 07/09/16  Evon Slackhomas C Detravion Tester, PA-C  potassium chloride (K-DUR) 10 MEQ tablet Take 1 tablet (10 mEq total) by mouth 2 (two) times daily. 06/28/11   Antonieta Ibaimothy J Gollan, MD  pravastatin (PRAVACHOL) 40 MG tablet Take 40 mg by mouth daily.      Historical Provider, MD  sotalol (BETAPACE) 120 MG tablet Take 120 mg by mouth 2 (two) times daily.      Historical Provider, MD   BP 155/56 mmHg  Pulse 69  Temp(Src) 98.2 F (36.8 C) (Oral)  Resp 20  Ht 5\' 6"  (1.676 m)  Wt 349 lb (158.305 kg)  BMI 56.36 kg/m2  SpO2  97% Physical Exam  Constitutional: He is oriented to person, place, and time. He appears well-developed and well-nourished.  HENT:  Head: Normocephalic and atraumatic.  Eyes: Conjunctivae and EOM are normal. Pupils are equal, round, and reactive to light.  Neck: Normal range of motion. Neck supple.  Cardiovascular: Normal rate and intact distal pulses.   Pulmonary/Chest: Effort normal. No respiratory distress.  Abdominal: Soft. Bowel sounds are normal. There is no tenderness.  Musculoskeletal: Normal range of motion.  Examination of the lower back shows patient has no spinous process or paravertebral muscle tenderness. Patient has good range of motion with lumbar flexion. Patient has right hip tenderness along the trochanteric bursa and right groin. There is full range of motion with hip internal and external rotation, internal rotation does reproduce right groin pain. Leg lengths are equal. No edema throughout the lower extremities. Incision is intact along the right lateral hip with no signs of infection. No muscle weakness in the right lower extremity.  Neurological: He is alert and oriented to person, place, and time.  Skin: Skin is warm and dry.  Psychiatric: He has a normal mood and affect. His behavior is normal. Judgment and thought content normal.    ED Course  Procedures (including critical care time) Labs Review Labs Reviewed - No data to display  Imaging Review Dg Hip Unilat With Pelvis 2-3 Views Right  07/10/2015  CLINICAL DATA:  Acute onset of right-sided nontraumatic hip pain. Initial encounter. EXAM: DG HIP (WITH OR WITHOUT PELVIS) 2-3V RIGHT COMPARISON:  CT of the abdomen and pelvis performed 01/31/2012 FINDINGS: There is no evidence of fracture or dislocation. There appears to be diffuse lucency about the proximal aspect of the patient's right femoral hardware. 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 There is mild chronic deformity involving the proximal right femur. Both  femoral heads are seated normally within their respective acetabula. The proximal right femur appears intact. No significant degenerative change is appreciated. The sacroiliac joints are unremarkable in appearance. The visualized bowel gas pattern is grossly unremarkable in appearance. IMPRESSION: 1. No evidence of fracture or dislocation. 2. Diffuse lucency about the proximal aspect of the patient's right femoral hardware. 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 Would correlate for associated symptoms. Electronically Signed   By: Roanna RaiderJeffery  Chang M.D.   On: 07/10/2015 23:28   I have personally reviewed and evaluated these images and lab results as part of my medical decision-making.   EKG Interpretation None      MDM   Final diagnoses:  Painful hip, right   64 year old male status post right hip ORIF performed in 1983, patient had been doing well up until 1 day ago when he awoke with right hip pain. No trauma or injury. 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 Patient will ambulate with a walker at all times. He will follow-up with orthopedics first of the week. Patient is requesting [facility], telephone for [facility] orthopedics was provided. The patient's given Percocet 5-3 25 one tab by mouth every 4-6 hours when necessary pain quantity #20 with 0 refills.    Evon Slack, PA-C 07/11/15 0007  Emily Filbert, MD 07/12/15 [phone]

## 2015-08-19 ENCOUNTER — Encounter: Payer: Self-pay | Admitting: Emergency Medicine

## 2015-08-19 ENCOUNTER — Emergency Department: Payer: Medicare Other

## 2015-08-19 ENCOUNTER — Inpatient Hospital Stay
Admission: EM | Admit: 2015-08-19 | Discharge: 2015-08-21 | DRG: 682 | Disposition: A | Discharge status: Home Health | Payer: Medicare Other | Attending: Specialist | Admitting: Specialist

## 2015-08-19 DIAGNOSIS — M109 Gout, unspecified: Secondary | ICD-10-CM | POA: Diagnosis present

## 2015-08-19 DIAGNOSIS — J069 Acute upper respiratory infection, unspecified: Secondary | ICD-10-CM | POA: Diagnosis present

## 2015-08-19 DIAGNOSIS — I4891 Unspecified atrial fibrillation: Secondary | ICD-10-CM | POA: Diagnosis present

## 2015-08-19 DIAGNOSIS — E1122 Type 2 diabetes mellitus with diabetic chronic kidney disease: Secondary | ICD-10-CM | POA: Diagnosis present

## 2015-08-19 DIAGNOSIS — I130 Hypertensive heart and chronic kidney disease with heart failure and stage 1 through stage 4 chronic kidney disease, or unspecified chronic kidney disease: Secondary | ICD-10-CM | POA: Diagnosis present

## 2015-08-19 DIAGNOSIS — Z87891 Personal history of nicotine dependence: Secondary | ICD-10-CM | POA: Diagnosis not present

## 2015-08-19 DIAGNOSIS — E785 Hyperlipidemia, unspecified: Secondary | ICD-10-CM | POA: Diagnosis present

## 2015-08-19 DIAGNOSIS — Z7984 Long term (current) use of oral hypoglycemic drugs: Secondary | ICD-10-CM | POA: Diagnosis not present

## 2015-08-19 DIAGNOSIS — Z7982 Long term (current) use of aspirin: Secondary | ICD-10-CM

## 2015-08-19 DIAGNOSIS — Z955 Presence of coronary angioplasty implant and graft: Secondary | ICD-10-CM

## 2015-08-19 DIAGNOSIS — Z79899 Other long term (current) drug therapy: Secondary | ICD-10-CM | POA: Diagnosis not present

## 2015-08-19 DIAGNOSIS — T502X5A Adverse effect of carbonic-anhydrase inhibitors, benzothiadiazides and other diuretics, initial encounter: Secondary | ICD-10-CM | POA: Diagnosis present

## 2015-08-19 DIAGNOSIS — I2510 Atherosclerotic heart disease of native coronary artery without angina pectoris: Secondary | ICD-10-CM | POA: Diagnosis present

## 2015-08-19 DIAGNOSIS — E875 Hyperkalemia: Secondary | ICD-10-CM

## 2015-08-19 DIAGNOSIS — R197 Diarrhea, unspecified: Secondary | ICD-10-CM

## 2015-08-19 DIAGNOSIS — I5033 Acute on chronic diastolic (congestive) heart failure: Secondary | ICD-10-CM | POA: Diagnosis present

## 2015-08-19 DIAGNOSIS — N183 Chronic kidney disease, stage 3 (moderate): Secondary | ICD-10-CM | POA: Diagnosis present

## 2015-08-19 DIAGNOSIS — G4733 Obstructive sleep apnea (adult) (pediatric): Secondary | ICD-10-CM | POA: Diagnosis present

## 2015-08-19 DIAGNOSIS — N179 Acute kidney failure, unspecified: Secondary | ICD-10-CM | POA: Diagnosis not present

## 2015-08-19 DIAGNOSIS — Z6841 Body Mass Index (BMI) 40.0 and over, adult: Secondary | ICD-10-CM

## 2015-08-19 DIAGNOSIS — Z79891 Long term (current) use of opiate analgesic: Secondary | ICD-10-CM

## 2015-08-19 DIAGNOSIS — K219 Gastro-esophageal reflux disease without esophagitis: Secondary | ICD-10-CM | POA: Diagnosis present

## 2015-08-19 DIAGNOSIS — H9190 Unspecified hearing loss, unspecified ear: Secondary | ICD-10-CM | POA: Diagnosis present

## 2015-08-19 DIAGNOSIS — Z881 Allergy status to other antibiotic agents status: Secondary | ICD-10-CM

## 2015-08-19 LAB — COMPREHENSIVE METABOLIC PANEL
ALBUMIN: 4.3 g/dL (ref 3.5–5.0)
ALT: 46 U/L (ref 17–63)
AST: 44 U/L — AB (ref 15–41)
Alkaline Phosphatase: 80 U/L (ref 38–126)
Anion gap: 10 (ref 5–15)
BILIRUBIN TOTAL: 0.6 mg/dL (ref 0.3–1.2)
BUN: 66 mg/dL — AB (ref 6–20)
CHLORIDE: 100 mmol/L — AB (ref 101–111)
CO2: 25 mmol/L (ref 22–32)
Calcium: 9 mg/dL (ref 8.9–10.3)
Creatinine, Ser: 2.11 mg/dL — ABNORMAL HIGH (ref 0.61–1.24)
GFR calc Af Amer: 36 mL/min — ABNORMAL LOW (ref >60)
GFR calc non Af Amer: 31 mL/min — ABNORMAL LOW (ref >60)
GLUCOSE: 256 mg/dL — AB (ref 65–99)
POTASSIUM: 6.3 mmol/L — AB (ref 3.5–5.1)
SODIUM: 135 mmol/L (ref 135–145)
Total Protein: 7.8 g/dL (ref 6.5–8.1)

## 2015-08-19 LAB — CBC WITH DIFFERENTIAL/PLATELET
Basophils Absolute: 0.1 10*3/uL (ref 0–0.1)
Basophils Relative: 1 %
Eosinophils Absolute: 0.2 10*3/uL (ref 0–0.7)
Eosinophils Relative: 3 %
HEMATOCRIT: 36.9 % — AB (ref 40.0–52.0)
HEMOGLOBIN: 12.3 g/dL — AB (ref 13.0–18.0)
LYMPHS ABS: 0.9 10*3/uL — AB (ref 1.0–3.6)
LYMPHS PCT: 12 %
MCH: 30.1 pg (ref 26.0–34.0)
MCHC: 33.3 g/dL (ref 32.0–36.0)
MCV: 90.4 fL (ref 80.0–100.0)
MONO ABS: 0.4 10*3/uL (ref 0.2–1.0)
MONOS PCT: 5 %
NEUTROS ABS: 5.8 10*3/uL (ref 1.4–6.5)
NEUTROS PCT: 79 %
Platelets: 209 10*3/uL (ref 150–440)
RBC: 4.08 MIL/uL — ABNORMAL LOW (ref 4.40–5.90)
RDW: 15.7 % — AB (ref 11.5–14.5)
WBC: 7.3 10*3/uL (ref 3.8–10.6)

## 2015-08-19 LAB — BRAIN NATRIURETIC PEPTIDE: B NATRIURETIC PEPTIDE 5: 14 pg/mL (ref 0.0–100.0)

## 2015-08-19 LAB — INFLUENZA PANEL BY PCR (TYPE A & B)
H1N1 flu by pcr: NOT DETECTED
Influenza A By PCR: NEGATIVE
Influenza B By PCR: NEGATIVE

## 2015-08-19 LAB — GLUCOSE, CAPILLARY
Glucose-Capillary: 115 mg/dL — ABNORMAL HIGH (ref 65–99)
Glucose-Capillary: 172 mg/dL — ABNORMAL HIGH (ref 65–99)

## 2015-08-19 LAB — C DIFFICILE QUICK SCREEN W PCR REFLEX
C DIFFICILE (CDIFF) INTERP: NEGATIVE
C Diff antigen: NEGATIVE
C Diff toxin: NEGATIVE

## 2015-08-19 LAB — LIPASE, BLOOD: Lipase: 59 U/L — ABNORMAL HIGH (ref 11–51)

## 2015-08-19 LAB — POTASSIUM
POTASSIUM: 6.3 mmol/L — AB (ref 3.5–5.1)
POTASSIUM: 5.7 mmol/L — AB (ref 3.5–5.1)

## 2015-08-19 LAB — CK: Total CK: 218 U/L (ref 49–397)

## 2015-08-19 MED ORDER — LORATADINE 10 MG PO TABS
10.0000 mg | ORAL_TABLET | Freq: Every day | ORAL | Status: DC
  Administered 2015-08-19 – 2015-08-21 (×3): 10 mg via ORAL
  Filled 2015-08-19 (×3): qty 1

## 2015-08-19 MED ORDER — CALCIUM GLUCONATE 10 % IV SOLN
1.0000 g | Freq: Once | INTRAVENOUS | Status: AC
  Administered 2015-08-19: 1 g via INTRAVENOUS
  Filled 2015-08-19: qty 10

## 2015-08-19 MED ORDER — SODIUM CHLORIDE 0.9 % IJ SOLN
3.0000 mL | Freq: Two times a day (BID) | INTRAMUSCULAR | Status: DC
  Administered 2015-08-20: 3 mL via INTRAVENOUS

## 2015-08-19 MED ORDER — OXYCODONE-ACETAMINOPHEN 5-325 MG PO TABS
1.0000 | ORAL_TABLET | Freq: Four times a day (QID) | ORAL | Status: DC | PRN
  Administered 2015-08-20 – 2015-08-21 (×4): 1 via ORAL
  Filled 2015-08-19 (×4): qty 1

## 2015-08-19 MED ORDER — INSULIN ASPART 100 UNIT/ML ~~LOC~~ SOLN
SUBCUTANEOUS | Status: AC
  Administered 2015-08-19: 10 [IU] via INTRAVENOUS
  Filled 2015-08-19: qty 10

## 2015-08-19 MED ORDER — IPRATROPIUM-ALBUTEROL 0.5-2.5 (3) MG/3ML IN SOLN
3.0000 mL | Freq: Once | RESPIRATORY_TRACT | Status: AC
  Administered 2015-08-19: 3 mL via RESPIRATORY_TRACT
  Filled 2015-08-19: qty 3

## 2015-08-19 MED ORDER — DEXTROSE 50 % IV SOLN
2.0000 | Freq: Once | INTRAVENOUS | Status: AC
  Administered 2015-08-19: 100 mL via INTRAVENOUS
  Filled 2015-08-19: qty 100

## 2015-08-19 MED ORDER — INSULIN ASPART 100 UNIT/ML IV SOLN
10.0000 [IU] | INTRAVENOUS | Status: AC
  Administered 2015-08-19: 10 [IU] via INTRAVENOUS
  Filled 2015-08-19: qty 0.1

## 2015-08-19 MED ORDER — INSULIN ASPART 100 UNIT/ML IV SOLN
10.0000 [IU] | Freq: Once | INTRAVENOUS | Status: DC
  Filled 2015-08-19: qty 0.1

## 2015-08-19 MED ORDER — METOPROLOL SUCCINATE ER 25 MG PO TB24
25.0000 mg | ORAL_TABLET | Freq: Every day | ORAL | Status: DC
  Administered 2015-08-19 – 2015-08-21 (×3): 25 mg via ORAL
  Filled 2015-08-19 (×4): qty 1

## 2015-08-19 MED ORDER — ASPIRIN EC 81 MG PO TBEC
81.0000 mg | DELAYED_RELEASE_TABLET | Freq: Every day | ORAL | Status: DC
  Administered 2015-08-20 – 2015-08-21 (×2): 81 mg via ORAL
  Filled 2015-08-19 (×3): qty 1

## 2015-08-19 MED ORDER — KETOROLAC TROMETHAMINE 30 MG/ML IJ SOLN
30.0000 mg | Freq: Once | INTRAMUSCULAR | Status: AC
  Administered 2015-08-19: 30 mg via INTRAVENOUS
  Filled 2015-08-19: qty 1

## 2015-08-19 MED ORDER — ATORVASTATIN CALCIUM 20 MG PO TABS
40.0000 mg | ORAL_TABLET | Freq: Every day | ORAL | Status: DC
  Administered 2015-08-19 – 2015-08-21 (×3): 40 mg via ORAL
  Filled 2015-08-19 (×3): qty 2

## 2015-08-19 MED ORDER — INSULIN ASPART 100 UNIT/ML ~~LOC~~ SOLN
0.0000 [IU] | Freq: Three times a day (TID) | SUBCUTANEOUS | Status: DC
  Administered 2015-08-20 – 2015-08-21 (×2): 1 [IU] via SUBCUTANEOUS
  Filled 2015-08-19 (×2): qty 1

## 2015-08-19 MED ORDER — HEPARIN SODIUM (PORCINE) 5000 UNIT/ML IJ SOLN
5000.0000 [IU] | Freq: Three times a day (TID) | INTRAMUSCULAR | Status: DC
  Administered 2015-08-19 – 2015-08-21 (×5): 5000 [IU] via SUBCUTANEOUS
  Filled 2015-08-19 (×6): qty 1

## 2015-08-19 MED ORDER — SODIUM BICARBONATE 8.4 % IV SOLN
50.0000 meq | Freq: Once | INTRAVENOUS | Status: AC
  Administered 2015-08-19: 50 meq via INTRAVENOUS
  Filled 2015-08-19: qty 50

## 2015-08-19 MED ORDER — TRAZODONE HCL 50 MG PO TABS
150.0000 mg | ORAL_TABLET | Freq: Every day | ORAL | Status: DC
  Administered 2015-08-19 – 2015-08-20 (×2): 150 mg via ORAL
  Filled 2015-08-19 (×3): qty 1

## 2015-08-19 MED ORDER — SODIUM CHLORIDE 0.9 % IV BOLUS (SEPSIS)
1000.0000 mL | Freq: Once | INTRAVENOUS | Status: AC
  Administered 2015-08-19: 1000 mL via INTRAVENOUS

## 2015-08-19 MED ORDER — SODIUM POLYSTYRENE SULFONATE 15 GM/60ML PO SUSP
30.0000 g | Freq: Once | ORAL | Status: AC
  Administered 2015-08-19: 30 g via ORAL
  Filled 2015-08-19: qty 120

## 2015-08-19 MED ORDER — SODIUM CHLORIDE 0.9 % IV SOLN
INTRAVENOUS | Status: DC
  Administered 2015-08-19 – 2015-08-21 (×3): via INTRAVENOUS

## 2015-08-19 MED ORDER — SODIUM POLYSTYRENE SULFONATE 15 GM/60ML PO SUSP: 30.0000 g | Freq: Once | ORAL | Status: DC

## 2015-08-19 NOTE — ED Notes (Signed)
MD at bedside. 

## 2015-08-19 NOTE — ED Notes (Signed)
Patient returned from radiology

## 2015-08-19 NOTE — ED Provider Notes (Signed)
[facility] Emergency Department Provider Note  ____________________________________________  Time seen: 11:45 AM on arrival by EMS  I have reviewed the triage vital signs and the nursing notes.   HISTORY  Chief Complaint Weakness    HPI Aaron Ball is a 64 y.o. male who complains of generalized weakness with a productive cough for 1 week. He is acute on chronic shortness of breath. No chest pain dizziness nausea vomiting or headaches. No recent viral illness no runny nose or sore throat.     Past Medical History  Diagnosis Date  . Coronary artery disease     s/p stent in RCA.  He has six stents.  . Hypertension   . Arrhythmia 04/2011    s/p A-fib ablation.  . Hyperlipidemia   . Wears hearing aid     right ear  . Diabetes mellitus without complication [facility])      Patient Active Problem List   Diagnosis Date Noted  . CAD (coronary artery disease) 06/28/2011  . Edema 06/28/2011  . Obesity 06/28/2011  . Hyperlipidemia 06/28/2011  . Chest pain 06/28/2011  . HTN (hypertension) 06/28/2011     Past Surgical History  Procedure Laterality Date  . Atrial ablation surgery      A-Fib  . Cholecystectomy  2012  . Cardiac catheterization      s/p stent to RCA.   . Cardiac catheterization  2008    Dr. Juliann Pares @ [facility] .  [name] Hip fracture surgery    . Heart stint  2013     Current Outpatient Rx  Name  Route  Sig  Dispense  Refill  . aspirin 81 MG tablet      Take 162 mg daily.          [name] atorvastatin (LIPITOR) 40 MG tablet   Oral   Take 1 tablet by mouth daily.         . colchicine 0.6 MG tablet   Oral   Take 0.6 mg by mouth daily.         [name] KLOR-CON M20 20 MEQ tablet   Oral   Take 20 mEq by mouth daily.            Dispense as written.   [name] lisinopril (PRINIVIL,ZESTRIL) 10 MG tablet   Oral   Take 1 tablet by mouth 2 (two) times daily.         [name] loratadine (CLARITIN) 10 MG tablet   Oral   Take 10 mg by mouth  daily.         . metFORMIN (GLUCOPHAGE) 500 MG tablet   Oral   Take 1 tablet by mouth 2 (two) times daily.         . metoprolol succinate (TOPROL-XL) 25 MG 24 hr tablet   Oral   Take 1 tablet by mouth daily.         . nitroGLYCERIN (NITROSTAT) 0.4 MG SL tablet   Sublingual   Place 0.4 mg under the tongue every 5 (five) minutes as needed.          . torsemide (DEMADEX) 20 MG tablet   Oral   Take 1 tablet by mouth 3 (three) times daily.         . traZODone (DESYREL) 100 MG tablet   Oral   Take 1.5 tablets by mouth daily.         . enalapril (VASOTEC) 10 MG tablet   Oral   Take 1 tablet (10 mg total) by  mouth 2 (two) times daily.   60 tablet   4   . oxyCODONE-acetaminophen (ROXICET) 5-325 MG tablet   Oral   Take 1 tablet by mouth every 6 (six) hours as needed.   20 tablet   0   . potassium chloride (K-DUR) 10 MEQ tablet   Oral   Take 1 tablet (10 mEq total) by mouth 2 (two) times daily.   180 tablet   4      Allergies Erythromycin   Family History  Problem Relation Age of Onset  . Heart disease Father   . Heart attack Father     Social History Social History  Substance Use Topics  . Smoking status: Former Smoker -- 1.00 packs/day for 30 years    Types: Cigarettes    Quit date: 06/28/2003  . Smokeless tobacco: None  . Alcohol Use: No    Review of Systems  Constitutional:   No fever or chills. No weight changes Eyes:   No blurry vision or double vision.  ENT:   No sore throat. Cardiovascular:   No chest pain. Respiratory:   Positive dyspnea positive cough. Gastrointestinal:   Negative for abdominal pain, vomiting and diarrhea.  No BRBPR or melena. Genitourinary:   Negative for dysuria, urinary retention, bloody urine, or difficulty urinating. Musculoskeletal:   Negative for back pain. No joint swelling or pain. Positive myalgia Skin:   Negative for rash. Neurological:   Negative for headaches, focal weakness or numbness. Psychiatric:   No anxiety or depression.   Endocrine:  No hot/cold intolerance, changes in energy, or sleep difficulty.  10-point ROS otherwise negative.  ____________________________________________   PHYSICAL EXAM:  VITAL SIGNS: ED Triage Vitals  Enc Vitals Group     BP 08/19/15 1149 107/51 mmHg     Pulse Rate 08/19/15 1149 86     Resp 08/19/15 1149 17     Temp 08/19/15 1149 98.1 F (36.7 C)     Temp src --      SpO2 08/19/15 1149 100 %     Weight 08/19/15 1149 348 lb (157.852 kg)     Height 08/19/15 1149  (1.676 m)     Head Cir --      Peak Flow --      Pain Score --      Pain Loc --      Pain Edu? --      Excl. in GC? --      Constitutional:   Alert and oriented. Well appearing and in no distress. Eyes:   No scleral icterus. No conjunctival pallor. PERRL. EOMI ENT   Head:   Normocephalic and atraumatic.   Nose:   No congestion/rhinnorhea. No septal hematoma   Mouth/Throat:   Dry mucous membranes, no pharyngeal erythema. No peritonsillar mass. No uvula shift.   Neck:   No stridor. No SubQ emphysema. No meningismus. Hematological/Lymphatic/Immunilogical:   No cervical lymphadenopathy. Cardiovascular:   RRR. Normal and symmetric distal pulses are present in all extremities. No murmurs, rubs, or gallops. Respiratory:   Normal respiratory effort without tachypnea nor retractions. Breath sounds are clear and equal bilaterally. No wheezes/rales/rhonchi. Gastrointestinal:   Soft and nontender. No distention. There is no CVA tenderness.  No rebound, rigidity, or guarding. Genitourinary:   deferred Musculoskeletal:   Nontender with normal range of motion in all extremities. No joint effusions.  No lower extremity tenderness.  No edema. Neurologic:   Normal speech and language.  CN 2-10 normal. Motor grossly intact. No pronator  drift.  Normal gait. No gross focal neurologic deficits are appreciated.  Skin:    Skin is warm, dry and intact. No rash noted.  No petechiae,  purpura, or bullae. Psychiatric:   Mood and affect are normal. Speech and behavior are normal. Patient exhibits appropriate insight and judgment.  ____________________________________________    LABS (pertinent positives/negatives) (all labs ordered are listed, but only abnormal results are displayed) Labs Reviewed  COMPREHENSIVE METABOLIC PANEL - Abnormal; Notable for the following:    Potassium 6.3 (*)    Chloride 100 (*)    Glucose, Bld 256 (*)    BUN 66 (*)    Creatinine, Ser 2.11 (*)    AST 44 (*)    GFR calc non Af Amer 31 (*)    GFR calc Af Amer 36 (*)    All other components within normal limits  LIPASE, BLOOD - Abnormal; Notable for the following:    Lipase 59 (*)    All other components within normal limits  CBC WITH DIFFERENTIAL/PLATELET - Abnormal; Notable for the following:    RBC 4.08 (*)    Hemoglobin 12.3 (*)    HCT 36.9 (*)    RDW 15.7 (*)    Lymphs Abs 0.9 (*)    All other components within normal limits  URINALYSIS COMPLETEWITH MICROSCOPIC ([facility] ONLY)  CK   ____________________________________________   EKG  Interpreted by me  Date: 08/19/2015  Rate: 80  Rhythm: normal sinus rhythm  QRS Axis: normal  Intervals: normal  ST/T Wave abnormalities: normal  Conduction Disutrbances: none  Narrative Interpretation: unremarkable      ____________________________________________    RADIOLOGY  Chest x-ray unremarkable  ____________________________________________   PROCEDURES CRITICAL CARE Performed by: Scotty CourtSTAFFORD, Arthur Aydelotte   Total critical care time: 35 minutes  Critical care time was exclusive of separately billable procedures and treating other patients.  Critical care was necessary to treat or prevent imminent or life-threatening deterioration.  Critical care was time spent personally by me on the following activities: development of treatment plan with patient and/or surrogate as well as nursing, discussions with consultants,  evaluation of patient's response to treatment, examination of patient, obtaining history from patient or surrogate, ordering and performing treatments and interventions, ordering and review of laboratory studies, ordering and review of radiographic studies, pulse oximetry and re-evaluation of patient's condition.   ____________________________________________   INITIAL IMPRESSION / ASSESSMENT AND PLAN / ED COURSE  Pertinent labs & imaging results that were available during my care of the patient were reviewed by me and considered in my medical decision making (see chart for details).  Patient presents with cough shortness of breath and myalgias and malaise, consistent with influenza-like illness. We'll check labs due to his diabetes and give IV fluids.  ----------------------------------------- 2:22 PM on 08/19/2015 -----------------------------------------  Vitals stable, no changes on EKG. Labs reveal acute renal failure with acute hyperkalemia. We'll give insulin and glucose calcium bicarbonate and Kayexalate to treat this. We'll continue IV fluids. I'll add on a CK to his labs to ensure that this is not rhabdomyolysis. I discussed with the hospitalist for admission. Add on a flu test given that he'll be coming in to the hospital for cohorting.     ____________________________________________   FINAL CLINICAL IMPRESSION(S) / ED DIAGNOSES  Final diagnoses:  Acute renal failure, unspecified acute renal failure type (HCC)  Acute hyperkalemia      Sharman CheekPhillip Taleisha Kaczynski, MD 08/19/15 1423

## 2015-08-19 NOTE — ED Notes (Signed)
Pt comes into the ED via EMS c/o generalized weakness and a cough for 1 week.  Patient states the cough has been productive with green sputum.  Patient normally has shortness of breath but it has increased in the past couple days.  Denies chest pain, dizziness, N/V.  Patient in no apparent distress at this time.  Respirations labored but symmetrical.

## 2015-08-19 NOTE — H&P (Signed)
[facility] - [place] at [facility]   PATIENT NAME: Aaron Ball    MR#:  [id number]  DATE OF BIRTH:  [birth year]  DATE OF ADMISSION:  08/19/2015  PRIMARY CARE PHYSICIAN: Pcp Not In System   REQUESTING/REFERRING PHYSICIAN: Scotty Court  CHIEF COMPLAINT:   Chief Complaint  Patient presents with  . Weakness    HISTORY OF PRESENT ILLNESS: Aaron Ball  is a 64 y.o. male with a known history of coronary artery disease status post and, atrial fibrillation no anticoagulation, hypertension, obstructive sleep apnea on CPAP, neuropathy, diabetes, morbid obesity, gastroesophageal reflux disease- had increased his dose of Lasix on advise off primary care physician a month ago because he was retaining more fluid, and he went for follow-up 10 days ago when he did not had much improvement so he added torsemide and potassium tablets to his medication. For last 7 days patient started having symptoms of cough and runny nose and feeling overall weak, which is gradually getting worse and last night he started having diarrhea so finally decided to come to emergency room today.  In ER he is noted to have acute worsening in renal function with hyperkalemia, but EKG is stable and patient's vital are stable so he was given treatments for potassium level and given for admission to hospitalist team.  PAST MEDICAL HISTORY:   Past Medical History  Diagnosis Date  . Coronary artery disease     s/p stent in RCA.  He has six stents.  . Hypertension   . Arrhythmia 04/2011    s/p A-fib ablation.  . Hyperlipidemia   . Wears hearing aid     right ear  . Diabetes mellitus without complication (HCC)     PAST SURGICAL HISTORY:  Past Surgical History  Procedure Laterality Date  . Atrial ablation surgery      A-Fib  . Cholecystectomy  2012  . Cardiac catheterization      s/p stent to RCA.   . Cardiac catheterization  2008    Dr. Juliann Pares @ [facility] .  [name] Hip fracture surgery    . Heart stint   2013    SOCIAL HISTORY:  Social History  Substance Use Topics  . Smoking status: Former Smoker -- 1.00 packs/day for 30 years    Types: Cigarettes    Quit date: 06/28/2003  . Smokeless tobacco: Not on file  . Alcohol Use: No    FAMILY HISTORY:  Family History  Problem Relation Age of Onset  . Heart disease Father   . Heart attack Father     DRUG ALLERGIES:  Allergies  Allergen Reactions  . Erythromycin Hives    REVIEW OF SYSTEMS:   CONSTITUTIONAL: No fever, positive for fatigue or weakness.  EYES: No blurred or double vision.  EARS, NOSE, AND THROAT: No tinnitus or ear pain.  RESPIRATORY: Positive for cough, no shortness of breath, wheezing or hemoptysis.  CARDIOVASCULAR: No chest pain, orthopnea, edema.  GASTROINTESTINAL: No nausea, vomiting, diarrhea or abdominal pain.  GENITOURINARY: No dysuria, hematuria.  ENDOCRINE: No polyuria, nocturia,  HEMATOLOGY: No anemia, easy bruising or bleeding SKIN: No rash or lesion. MUSCULOSKELETAL: No joint pain or arthritis.   NEUROLOGIC: No tingling, numbness, weakness.  PSYCHIATRY: No anxiety or depression.   MEDICATIONS AT HOME:  Prior to Admission medications   Medication Sig Start Date End Date Taking? Authorizing Provider  aspirin 81 MG tablet Take 162 mg daily.    Yes Historical Provider, MD  atorvastatin (LIPITOR) 40 MG tablet Take 1  tablet by mouth daily. 08/10/15  Yes Historical Provider, MD  colchicine 0.6 MG tablet Take 0.6 mg by mouth daily. 07/21/15  Yes Historical Provider, MD  KLOR-CON M20 20 MEQ tablet Take 20 mEq by mouth daily.  07/26/15  Yes Historical Provider, MD  lisinopril (PRINIVIL,ZESTRIL) 10 MG tablet Take 1 tablet by mouth 2 (two) times daily. 08/03/15  Yes Historical Provider, MD  loratadine (CLARITIN) 10 MG tablet Take 10 mg by mouth daily.   Yes Historical Provider, MD  metFORMIN (GLUCOPHAGE) 500 MG tablet Take 1 tablet by mouth 2 (two) times daily. 08/03/15  Yes Historical Provider, MD  metoprolol  succinate (TOPROL-XL) 25 MG 24 hr tablet Take 1 tablet by mouth daily. 07/26/15  Yes Historical Provider, MD  nitroGLYCERIN (NITROSTAT) 0.4 MG SL tablet Place 0.4 mg under the tongue every 5 (five) minutes as needed.  07/08/15  Yes Historical Provider, MD  torsemide (DEMADEX) 20 MG tablet Take 1 tablet by mouth 3 (three) times daily. 08/07/15  Yes Historical Provider, MD  traZODone (DESYREL) 100 MG tablet Take 1.5 tablets by mouth daily. 08/19/15  Yes Historical Provider, MD  enalapril (VASOTEC) 10 MG tablet Take 1 tablet (10 mg total) by mouth 2 (two) times daily. 07/05/11   Antonieta Iba, MD  oxyCODONE-acetaminophen (ROXICET) 5-325 MG tablet Take 1 tablet by mouth every 6 (six) hours as needed. 07/10/15 07/09/16  Evon Slack, PA-C  potassium chloride (K-DUR) 10 MEQ tablet Take 1 tablet (10 mEq total) by mouth 2 (two) times daily. 06/28/11   Antonieta Iba, MD      PHYSICAL EXAMINATION:   VITAL SIGNS: Blood pressure 110/86, pulse 80, temperature 98.1 F (36.7 C), resp. rate 17, height  (1.676 m), weight 157.852 kg (348 lb), SpO2 100 %.  GENERAL:  64 y.o.-year-old obese patient sitting in the bed with no acute distress.  EYES: Pupils equal, round, reactive to light and accommodation. No scleral icterus. Extraocular muscles intact.  HEENT: Head atraumatic, normocephalic. Oropharynx and nasopharynx clear. Have a runny nose.  NECK:  Supple, no jugular venous distention. No thyroid enlargement, no tenderness.  LUNGS: Normal breath sounds bilaterally, no wheezing, rales,rhonchi or crepitation. No use of accessory muscles of respiration.  CARDIOVASCULAR: S1, S2 normal. No murmurs, rubs, or gallops.  ABDOMEN: Soft, nontender, nondistended. Bowel sounds present. No organomegaly or mass.  EXTREMITIES: No pedal edema, cyanosis, or clubbing.  NEUROLOGIC: Cranial nerves II through XII are intact. Muscle strength 5/5 in all extremities. Sensation intact. Gait not checked.  PSYCHIATRIC: The  patient is alert and oriented x 3.  SKIN: No obvious rash, lesion, or ulcer.   LABORATORY PANEL:   CBC  Recent Labs Lab 08/19/15 1205  WBC 7.3  HGB 12.3*  HCT 36.9*  PLT 209  MCV 90.4  MCH 30.1  MCHC 33.3  RDW 15.7*  LYMPHSABS 0.9*  MONOABS 0.4  EOSABS 0.2  BASOSABS 0.1   ------------------------------------------------------------------------------------------------------------------  Chemistries   Recent Labs Lab 08/19/15 1205  NA 135  K 6.3*  CL 100*  CO2 25  GLUCOSE 256*  BUN 66*  CREATININE 2.11*  CALCIUM 9.0  AST 44*  ALT 46  ALKPHOS 80  BILITOT 0.6   ------------------------------------------------------------------------------------------------------------------ estimated creatinine clearance is 50.7 mL/min (by C-G formula based on Cr of 2.11). ------------------------------------------------------------------------------------------------------------------ No results for input(s): TSH, T4TOTAL, T3FREE, THYROIDAB in the last 72 hours.  Invalid input(s): FREET3   Coagulation profile No results for input(s): INR, PROTIME in the last 168 hours. ------------------------------------------------------------------------------------------------------------------- No results for  input(s): DDIMER in the last 72 hours. -------------------------------------------------------------------------------------------------------------------  Cardiac Enzymes No results for input(s): CKMB, TROPONINI, MYOGLOBIN in the last 168 hours.  Invalid input(s): CK ------------------------------------------------------------------------------------------------------------------ Invalid input(s): POCBNP  ---------------------------------------------------------------------------------------------------------------  Urinalysis    Component Value Date/Time   COLORURINE Yellow 01/31/2012 1220   APPEARANCEUR Cloudy 01/31/2012 1220   LABSPEC 1.019 01/31/2012 1220   PHURINE  6.0 01/31/2012 1220   GLUCOSEU Negative 01/31/2012 1220   HGBUR 2+ 01/31/2012 1220   BILIRUBINUR Negative 01/31/2012 1220   KETONESUR Negative 01/31/2012 1220   PROTEINUR Negative 01/31/2012 1220   NITRITE Negative 01/31/2012 1220   LEUKOCYTESUR 3+ 01/31/2012 1220     RADIOLOGY: Dg Chest 2 View  08/19/2015  CLINICAL DATA:  Productive cough for 1 week EXAM: CHEST  2 VIEW COMPARISON:  11/14/2014 FINDINGS: Cardiomegaly again noted. No acute infiltrate or pulmonary edema. Degenerative changes thoracic spine again noted. Probable chronic mild interstitial prominence bilaterally. IMPRESSION: No active disease.  Degenerative changes thoracic spine. Electronically Signed   By: Natasha MeadLiviu  Pop M.D.   On: 08/19/2015 12:49    EKG: Normal sinus rhythm.  IMPRESSION AND PLAN:  * Acute renal failure with hyperkalemia  Possible secondary to combination of things like increased dose of diuretics plus oral potassium supplements, viral sickness for last 1 week, diarrhea for last night.  Urinalysis is not checked yet I will wait for the results.  We'll give IV fluids and monitor renal function Kayexalate and calcium gluconate injections are given by ER physician, I will recheck potassium level later today.  Monitor on telemetry, currently there are no EKG changes.  * Diarrhea  I will check stool for culture.  No any antibiotic recently.  * Viral syndrome  Patient had chills, runny nose, generalized weakness  ER physician sent test for flu  We will check the results.  * Diabetes  Because of renal failure would hold metformin, keep him on insulin sliding scale coverage.  * Hypertension  Currently we'll hold his ACE inhibitor and diuretic because of renal failure, he will be on IV fluid for renal failure. Monitor blood pressure closely.  * History of coronary artery disease  Continue aspirin, statin, metoprolol.   All the records are reviewed and case discussed with ED provider. Management plans  discussed with the patient, family and they are in agreement.  CODE STATUS: Full code    TOTAL TIME TAKING CARE OF THIS PATIENT: 50 minutes.    Altamese DillingVACHHANI, Anushree Dorsi M.D on 08/19/2015   Between 7am to 6pm - Pager - [phone]  After 6pm go to [email or web address] - password [email or web address]  Fabio Neighborsagle [place] [facility]  Office  [phone]  CC: Primary care physician; Pcp Not In System   Note: This dictation was prepared with Dragon dictation along with smaller phrase technology. Any transcriptional errors that result from this process are unintentional.

## 2015-08-19 NOTE — ED Notes (Signed)
Patient transported to X-ray 

## 2015-08-19 NOTE — Consult Note (Signed)
[facility]  CONSULT NOTE    Date: 08/19/2015                  Patient Name:  Nainoa Woldt  MRN: [id number]  DOB: July 06, 1951  Age / Sex: 64 y.o., male         PCP: Pcp Not In System                 Service Requesting Consult: Dr. Anselm Jungling                 Reason for Consult: Acute renal failure with hyperkalemia            History of Present Illness: Mr. Kearney Evitt is a 64 y.o. white male with coronary artery disease, hypertension, congestive heart failure, diabetes mellitus type II, hearing loss, cholecystectomy, right hip fracture, morbid obesity, obstructive sleep apnea on CPAP, GERD and gout who was admitted to [facility] on 08/19/2015 for Acute hyperkalemia [E87.5] Acute renal failure, unspecified acute renal failure type ([place]) [N17.9]   Patient was having increasing peripheral edema that was resistant to furosemide. He was then changed to high dose torsemide and increased potassium supplementation.Edema improved but he felt bad.   Developed upper respiratory tract infection and then diarrhea. Presented to ED with creatinine of 2.11  From baseline of 1.44, eGFR of 50 on 08/04/15. Patient found to have a potassium of 6.3. No EKG changes.  Given kayexalate, calcium gluconate, dextrose, insulin, albuterol And started on IV NS at 132m.   Also given ketorolac in ED   Medications: Outpatient medications: Prescriptions prior to admission  Medication Sig Dispense Refill Last Dose  . aspirin 81 MG tablet Take 162 mg daily.    08/19/2015 at Unknown time  . atorvastatin (LIPITOR) 40 MG tablet Take 1 tablet by mouth daily.   08/18/2015 at Unknown time  . colchicine 0.6 MG tablet Take 0.6 mg by mouth daily.   08/19/2015 at Unknown time  . KLOR-CON M20 20 MEQ tablet Take 20 mEq by mouth daily.    08/19/2015 at Unknown time  . lisinopril (PRINIVIL,ZESTRIL) 10 MG tablet Take 1 tablet by mouth 2 (two) times daily.   08/19/2015 at Unknown time  . loratadine  (CLARITIN) 10 MG tablet Take 10 mg by mouth daily.   08/19/2015 at Unknown time  . metFORMIN (GLUCOPHAGE) 500 MG tablet Take 1 tablet by mouth 2 (two) times daily.   08/19/2015 at Unknown time  . metoprolol succinate (TOPROL-XL) 25 MG 24 hr tablet Take 1 tablet by mouth daily.   08/19/2015 at Unknown time  . nitroGLYCERIN (NITROSTAT) 0.4 MG SL tablet Place 0.4 mg under the tongue every 5 (five) minutes as needed.    prn at prn  . torsemide (DEMADEX) 20 MG tablet Take 1 tablet by mouth 3 (three) times daily.   08/19/2015 at Unknown time  . traZODone (DESYREL) 100 MG tablet Take 1.5 tablets by mouth daily.   08/18/2015 at Unknown time  . enalapril (VASOTEC) 10 MG tablet Take 1 tablet (10 mg total) by mouth 2 (two) times daily. 60 tablet 4   . oxyCODONE-acetaminophen (ROXICET) 5-325 MG tablet Take 1 tablet by mouth every 6 (six) hours as needed. 20 tablet 0   . potassium chloride (K-DUR) 10 MEQ tablet Take 1 tablet (10 mEq total) by mouth 2 (two) times daily. 180 tablet 4     Current medications: Current Facility-Administered Medications  Medication Dose Route Frequency Provider Last Rate Last Dose  .  0.9 %  sodium chloride infusion   Intravenous Continuous Lavonia Dana, MD      . aspirin EC tablet 81 mg  81 mg Oral Daily Vaughan Basta, MD      . atorvastatin (LIPITOR) tablet 40 mg  40 mg Oral Daily Vaughan Basta, MD      . heparin injection 5,000 Units  5,000 Units Subcutaneous 3 times per day Vaughan Basta, MD      . Derrill Memo ON 08/20/2015] insulin aspart (novoLOG) injection 0-9 Units  0-9 Units Subcutaneous TID WC Vaughan Basta, MD      . loratadine (CLARITIN) tablet 10 mg  10 mg Oral Daily Vaughan Basta, MD      . metoprolol succinate (TOPROL-XL) 24 hr tablet 25 mg  25 mg Oral Daily Vaughan Basta, MD      . oxyCODONE-acetaminophen (PERCOCET/ROXICET) 5-325 MG per tablet 1 tablet  1 tablet Oral Q6H PRN Vaughan Basta, MD      . sodium chloride 0.9  % injection 3 mL  3 mL Intravenous Q12H Vaughan Basta, MD      . traZODone (DESYREL) tablet 150 mg  150 mg Oral Daily Vaughan Basta, MD          Allergies: Allergies  Allergen Reactions  . Erythromycin Hives      Past Medical History: Past Medical History  Diagnosis Date  . Coronary artery disease     s/p stent in RCA.  He has six stents.  . Hypertension   . Arrhythmia 04/2011    s/p A-fib ablation.  . Hyperlipidemia   . Wears hearing aid     right ear  . Diabetes mellitus without complication [facility])      Past Surgical History: Past Surgical History  Procedure Laterality Date  . Atrial ablation surgery      A-Fib  . Cholecystectomy  2012  . Cardiac catheterization      s/p stent to RCA.   . Cardiac catheterization  2008    Dr. Clayborn Bigness @ [facility] .  [name] Hip fracture surgery    . Heart stint  2013     Family History: Family History  Problem Relation Age of Onset  . Heart disease Father   . Heart attack Father      Social History: Social History   Social History  . Marital Status: Divorced    Spouse Name: N/A  . Number of Children: N/A  . Years of Education: N/A   Occupational History  . Not on file.   Social History Main Topics  . Smoking status: Former Smoker -- 1.00 packs/day for 30 years    Types: Cigarettes    Quit date: 06/28/2003  . Smokeless tobacco: Not on file  . Alcohol Use: No  . Drug Use: No  . Sexual Activity: Not on file   Other Topics Concern  . Not on file   Social History Narrative     Review of Systems: Review of Systems  Constitutional: Positive for fever, chills, weight loss, malaise/fatigue and diaphoresis.  HENT: Positive for congestion, hearing loss and sore throat. Negative for ear discharge, ear pain, nosebleeds and tinnitus.   Eyes: Negative.  Negative for blurred vision, double vision, photophobia, pain, discharge and redness.  Respiratory: Positive for cough, sputum production, shortness of breath  and wheezing. Negative for hemoptysis and stridor.   Cardiovascular: Positive for orthopnea, leg swelling and PND. Negative for chest pain, palpitations and claudication.  Gastrointestinal: Positive for heartburn and diarrhea. Negative for nausea, vomiting, abdominal  pain, constipation, blood in stool and melena.  Genitourinary: Negative.  Negative for dysuria, urgency, frequency, hematuria and flank pain.  Musculoskeletal: Positive for back pain. Negative for myalgias, joint pain, falls and neck pain.  Skin: Negative.  Negative for itching and rash.  Neurological: Positive for weakness and headaches. Negative for dizziness, tingling, tremors, sensory change, speech change, focal weakness, seizures and loss of consciousness.  Endo/Heme/Allergies: Negative.  Negative for environmental allergies and polydipsia. Does not bruise/bleed easily.  Psychiatric/Behavioral: Negative.  Negative for depression, suicidal ideas, hallucinations, memory loss and substance abuse. The patient is not nervous/anxious and does not have insomnia.     Vital Signs: Blood pressure 102/64, pulse 78, temperature 98.1 F (36.7 C), temperature source Oral, resp. rate 18, height '5\' 6"'  (1.676 m), weight 161.027 kg (355 lb), SpO2 98 %.  Weight trends: Filed Weights   08/19/15 1149 08/19/15 1627  Weight: 157.852 kg (348 lb) 161.027 kg (355 lb)    Physical Exam: General: NAD, sitting in chair, morbidly obese white male  Head: Normocephalic, atraumatic. Moist oral mucosal membranes  Eyes: Anicteric, PERRL  Neck: Supple, trachea midline  Lungs:  Clear to auscultation  Heart: Regular rate and rhythm  Abdomen:  Soft, nontender, obese  Extremities:  1+ peripheral edema.  Neurologic: Nonfocal, moving all four extremities  Skin: No lesions        Lab results: Basic Metabolic Panel:  Recent Labs Lab 08/19/15 1205  NA 135  K 6.3*  6.3*  CL 100*  CO2 25  GLUCOSE 256*  BUN 66*  CREATININE 2.11*  CALCIUM 9.0     Liver Function Tests:  Recent Labs Lab 08/19/15 1205  AST 44*  ALT 46  ALKPHOS 80  BILITOT 0.6  PROT 7.8  ALBUMIN 4.3    Recent Labs Lab 08/19/15 1205  LIPASE 59*   No results for input(s): AMMONIA in the last 168 hours.  CBC:  Recent Labs Lab 08/19/15 1205  WBC 7.3  NEUTROABS 5.8  HGB 12.3*  HCT 36.9*  MCV 90.4  PLT 209    Cardiac Enzymes:  Recent Labs Lab 08/19/15 1205  CKTOTAL 218    BNP: Invalid input(s): POCBNP  CBG: No results for input(s): GLUCAP in the last 168 hours.  Microbiology: Results for orders placed or performed in visit on 01/31/12  Urine culture     Status: None   Collection Time: 01/31/12 12:20 PM  Result Value Ref Range Status   Micro Text Report   Final       SOURCE: CC    ORGANISM 1                >100,000 CFU/ML STREPTOCOCCUS VIRIDANS   COMMENT                   -   COMMENT                   -   ANTIBIOTIC                                                        Coagulation Studies: No results for input(s): LABPROT, INR in the last 72 hours.  Urinalysis: No results for input(s): COLORURINE, LABSPEC, PHURINE, GLUCOSEU, HGBUR, BILIRUBINUR, KETONESUR, PROTEINUR, UROBILINOGEN, NITRITE, LEUKOCYTESUR in the last 72 hours.  Invalid input(s): APPERANCEUR  Imaging: Dg Chest 2 View  08/19/2015  CLINICAL DATA:  Productive cough for 1 week EXAM: CHEST  2 VIEW COMPARISON:  11/14/2014 FINDINGS: Cardiomegaly again noted. No acute infiltrate or pulmonary edema. Degenerative changes thoracic spine again noted. Probable chronic mild interstitial prominence bilaterally. IMPRESSION: No active disease.  Degenerative changes thoracic spine. Electronically Signed   By: Lahoma Crocker M.D.   On: 08/19/2015 12:49      Assessment & Plan: Mr. Amrom Ore is a 64 y.o. white male with coronary artery disease, hypertension, congestive heart failure, diabetes mellitus type II, hearing loss, cholecystectomy, right hip fracture, morbid  obesity, obstructive sleep apnea on CPAP, GERD and gout who was admitted to [facility] on 08/19/2015 for Acute hyperkalemia [E87.5] and Acute renal failure, unspecified acute renal failure type ([place]) [N17.9]   1. Acute renal failure on chronic kidney disease stage III with hyperkalemia: hyperkalemia secondary to potassium iatrogenic.  Status post shifting and kayexalate.  Acute renal failure from overdiuresis and diarrhea Chronic kidney disease secondary to hypertension and diabetes - stat potassium - decrease IV fluids to NS at 50 due to risk of congestive heart failure - hold diuretics and potassium supplement.   2. Hypertension, edema, congestive heart failure diastolic - monitor volume status closely.  - low salt diet.     LOS: 0 Quantrell Splitt 12/7/20165:32 PM

## 2015-08-20 LAB — GLUCOSE, CAPILLARY
GLUCOSE-CAPILLARY: 145 mg/dL — AB (ref 65–99)
Glucose-Capillary: 111 mg/dL — ABNORMAL HIGH (ref 65–99)
Glucose-Capillary: 111 mg/dL — ABNORMAL HIGH (ref 65–99)
Glucose-Capillary: 131 mg/dL — ABNORMAL HIGH (ref 65–99)

## 2015-08-20 LAB — BASIC METABOLIC PANEL
Anion gap: 6 (ref 5–15)
BUN: 58 mg/dL — AB (ref 6–20)
CALCIUM: 8 mg/dL — AB (ref 8.9–10.3)
CO2: 25 mmol/L (ref 22–32)
CREATININE: 1.76 mg/dL — AB (ref 0.61–1.24)
Chloride: 106 mmol/L (ref 101–111)
GFR calc Af Amer: 45 mL/min — ABNORMAL LOW (ref >60)
GFR, EST NON AFRICAN AMERICAN: 39 mL/min — AB (ref >60)
GLUCOSE: 131 mg/dL — AB (ref 65–99)
Potassium: 5.4 mmol/L — ABNORMAL HIGH (ref 3.5–5.1)
SODIUM: 137 mmol/L (ref 135–145)

## 2015-08-20 LAB — URINALYSIS COMPLETE WITH MICROSCOPIC (ARMC ONLY)
BACTERIA UA: NONE SEEN
BILIRUBIN URINE: NEGATIVE
Glucose, UA: NEGATIVE mg/dL
HGB URINE DIPSTICK: NEGATIVE
KETONES UR: NEGATIVE mg/dL
LEUKOCYTES UA: NEGATIVE
NITRITE: NEGATIVE
PH: 7 (ref 5.0–8.0)
PROTEIN: NEGATIVE mg/dL
RBC / HPF: NONE SEEN RBC/hpf (ref 0–5)
SQUAMOUS EPITHELIAL / LPF: NONE SEEN
Specific Gravity, Urine: 1.012 (ref 1.005–1.030)

## 2015-08-20 LAB — CBC
HCT: 33.2 % — ABNORMAL LOW (ref 40.0–52.0)
Hemoglobin: 11 g/dL — ABNORMAL LOW (ref 13.0–18.0)
MCH: 30.3 pg (ref 26.0–34.0)
MCHC: 33.3 g/dL (ref 32.0–36.0)
MCV: 91.1 fL (ref 80.0–100.0)
PLATELETS: 182 10*3/uL (ref 150–440)
RBC: 3.64 MIL/uL — ABNORMAL LOW (ref 4.40–5.90)
RDW: 15.8 % — AB (ref 11.5–14.5)
WBC: 6.4 10*3/uL (ref 3.8–10.6)

## 2015-08-20 MED ORDER — SALINE SPRAY 0.65 % NA SOLN
1.0000 | NASAL | Status: DC | PRN
  Administered 2015-08-20: 1 via NASAL
  Filled 2015-08-20: qty 44

## 2015-08-20 NOTE — Progress Notes (Signed)
[facility]  ROUNDING NOTE   Subjective:   Sitting up in chair Continues to have diarrhea. C. Diff negative.   K 5.4 (6.3) Creatinine 1.76 (2.11)  Objective:  Vital signs in last 24 hours:  Temp:  [97.7 F (36.5 C)-98.5 F (36.9 C)] 97.7 F (36.5 C) (12/08 0943) Pulse Rate:  [72-86] 78 (12/08 0943) Resp:  [17-24] 22 (12/08 0402) BP: (91-131)/(32-86) 131/46 mmHg (12/08 0943) SpO2:  [93 %-100 %] 99 % (12/08 0943) Weight:  [157.852 kg (348 lb)-161.027 kg (355 lb)] 161.027 kg (355 lb) (12/07 1627)  Weight change:  Filed Weights   08/19/15 1149 08/19/15 1627  Weight: 157.852 kg (348 lb) 161.027 kg (355 lb)    Intake/Output:     Intake/Output this shift:  Total I/O In: 943 [I.V.:943] Out: -   Physical Exam: General: NAD, morbidly obese  Head: Normocephalic, atraumatic. Moist oral mucosal membranes  Eyes: Anicteric, PERRL  Neck: Supple, trachea midline  Lungs:  Distant lung sounds  Heart: Regular rate and rhythm  Abdomen:  Soft, nontender,   Extremities: 1-2+ peripheral edema.  Neurologic: Nonfocal, moving all four extremities  Skin: No lesions       Basic Metabolic Panel:  Recent Labs Lab 08/19/15 1205 08/19/15 1742 08/20/15 0715  NA 135  --  137  K 6.3*  6.3* 5.7* 5.4*  CL 100*  --  106  CO2 25  --  25  GLUCOSE 256*  --  131*  BUN 66*  --  58*  CREATININE 2.11*  --  1.76*  CALCIUM 9.0  --  8.0*    Liver Function Tests:  Recent Labs Lab 08/19/15 1205  AST 44*  ALT 46  ALKPHOS 80  BILITOT 0.6  PROT 7.8  ALBUMIN 4.3    Recent Labs Lab 08/19/15 1205  LIPASE 59*   No results for input(s): AMMONIA in the last 168 hours.  CBC:  Recent Labs Lab 08/19/15 1205 08/20/15 0715  WBC 7.3 6.4  NEUTROABS 5.8  --   HGB 12.3* 11.0*  HCT 36.9* 33.2*  MCV 90.4 91.1  PLT 209 182    Cardiac Enzymes:  Recent Labs Lab 08/19/15 1205  CKTOTAL 218    BNP: Invalid input(s): POCBNP  CBG:  Recent Labs Lab 08/19/15 1746  08/19/15 2209 08/20/15 0740  GLUCAP 115* 172* 111*    Microbiology: Results for orders placed or performed during the hospital encounter of 08/19/15  C difficile quick scan w PCR reflex     Status: None   Collection Time: 08/19/15  8:30 PM  Result Value Ref Range Status   C Diff antigen NEGATIVE NEGATIVE Final   C Diff toxin NEGATIVE NEGATIVE Final   C Diff interpretation Negative for C. difficile  Final    Coagulation Studies: No results for input(s): LABPROT, INR in the last 72 hours.  Urinalysis: No results for input(s): COLORURINE, LABSPEC, PHURINE, GLUCOSEU, HGBUR, BILIRUBINUR, KETONESUR, PROTEINUR, UROBILINOGEN, NITRITE, LEUKOCYTESUR in the last 72 hours.  Invalid input(s): APPERANCEUR    Imaging: Dg Chest 2 View  08/19/2015  CLINICAL DATA:  Productive cough for 1 week EXAM: CHEST  2 VIEW COMPARISON:  11/14/2014 FINDINGS: Cardiomegaly again noted. No acute infiltrate or pulmonary edema. Degenerative changes thoracic spine again noted. Probable chronic mild interstitial prominence bilaterally. IMPRESSION: No active disease.  Degenerative changes thoracic spine. Electronically Signed   By: Natasha MeadLiviu  Pop M.D.   On: 08/19/2015 12:49     Medications:   . sodium chloride 50 mL/hr at 08/20/15  4098   . aspirin EC  81 mg Oral Daily  . atorvastatin  40 mg Oral Daily  . heparin  5,000 Units Subcutaneous 3 times per day  . insulin aspart  0-9 Units Subcutaneous TID WC  . loratadine  10 mg Oral Daily  . metoprolol succinate  25 mg Oral Daily  . sodium chloride  3 mL Intravenous Q12H  . traZODone  150 mg Oral Daily   oxyCODONE-acetaminophen  Assessment/ Plan:  [name] is a 64 y.o. white male with coronary artery disease, hypertension, congestive heart failure, diabetes mellitus type II, hearing loss, cholecystectomy, right hip fracture, morbid obesity, obstructive sleep apnea on CPAP, GERD and gout who was admitted to [facility] on 08/19/2015 for Acute hyperkalemia [E87.5]  and Acute renal failure, unspecified acute renal failure type ([facility]) [N17.9]   1. Acute renal failure on chronic kidney disease stage III with hyperkalemia: hyperkalemia secondary to iatrogenic potassium .  Status post shifting and kayexalate. potassium now improved. Avoid potassium supplements. Holding lisinopril.  Acute renal failure from overdiuresis and diarrhea Chronic kidney disease secondary to hypertension and diabetes  2. Hypertension, edema, congestive heart failure diastolic - monitor volume status closely.  - low salt diet.    LOS: 1 Aaron Ball 12/8/20169:54 AM

## 2015-08-20 NOTE — Progress Notes (Signed)
Pt doesn't want to wear CPAP for sleep tonight. Encouraged pt to call should he change his mind

## 2015-08-20 NOTE — Progress Notes (Signed)
Initial Nutrition Assessment    INTERVENTION:  Meals and snacks: monitor intake. May need to consider K restriction.   NUTRITION DIAGNOSIS:   Altered nutrition lab value related to acute illness as evidenced by  (elevated K).    GOAL:   Patient will meet greater than or equal to 90% of their needs    MONITOR:    (Energy intake, Electrolyte and renal profile)  REASON FOR ASSESSMENT:   Diagnosis    ASSESSMENT:      Pt admitted with ARF likely from overdiuresis and diarrhea per MD note.  Had 7 day history of cough, runny nose, then diarrhea. C-diff negative  Past Medical History  Diagnosis Date  . Coronary artery disease     s/p stent in RCA.  He has six stents.  . Hypertension   . Arrhythmia 04/2011    s/p A-fib ablation.  . Hyperlipidemia   . Wears hearing aid     right ear  . Diabetes mellitus without complication (HCC)     Current Nutrition: unable to speak with pt this am, out of room on visit  Food/Nutrition-Related History: unable to determine at this time. Per MST normal intake prior to admission   Scheduled Medications:  . aspirin EC  81 mg Oral Daily  . atorvastatin  40 mg Oral Daily  . heparin  5,000 Units Subcutaneous 3 times per day  . insulin aspart  0-9 Units Subcutaneous TID WC  . loratadine  10 mg Oral Daily  . metoprolol succinate  25 mg Oral Daily  . sodium chloride  3 mL Intravenous Q12H  . traZODone  150 mg Oral Daily    Continuous Medications:  . sodium chloride 50 mL/hr at 08/20/15 0926     Electrolyte/Renal Profile and Glucose Profile:   Recent Labs Lab 08/19/15 1205 08/19/15 1742 08/20/15 0715  NA 135  --  137  K 6.3*  6.3* 5.7* 5.4*  CL 100*  --  106  CO2 25  --  25  BUN 66*  --  58*  CREATININE 2.11*  --  1.76*  CALCIUM 9.0  --  8.0*  GLUCOSE 256*  --  131*   Protein Profile:  Recent Labs Lab 08/19/15 1205  ALBUMIN 4.3    Gastrointestinal Profile: Last BM: 12/8    Weight Change: noted wt increase  prior to admission    Diet Order:  Diet heart healthy/carb modified Room service appropriate?: Yes; Fluid consistency:: Thin  Skin:   reviewed   Height:   Ht Readings from Last 1 Encounters:  08/19/15 5\' 6"  (1.676 m)    Weight:   Wt Readings from Last 1 Encounters:  08/19/15 355 lb (161.027 kg)    Ideal Body Weight:     BMI:  Body mass index is 57.33 kg/(m^2).  Estimated Nutritional Needs:   Kcal:  BEE 1382 kcals (IF 1.1-1.3, AF 1.3) [id number] kcals/d. Using IBW of 65kg  Protein:  (1.0 g/kg) 65 g/d  Fluid:  (25-8830ml/kg) 1625-196850ml/d  EDUCATION NEEDS:   No education needs identified at this time  MODERATE Care Level  Consuello Lassalle B. Freida BusmanAllen, RD, LDN [phone] (pager)

## 2015-08-20 NOTE — Progress Notes (Signed)
Spoke to physician per patient request for nasal spray.

## 2015-08-20 NOTE — Progress Notes (Signed)
Pt BP low. MD notified. Acknowledged. No new orders placed. Will continue to monitor.

## 2015-08-20 NOTE — Progress Notes (Signed)
Pt placed on [facility] auto Cpap C-1 for sleep. Pt is tolerating well.

## 2015-08-20 NOTE — Progress Notes (Signed)
[facility] [place] [facility]   PATIENT NAME: Aaron Ball    MR#:  [id number]  DATE OF BIRTH:  March 20, 1951  SUBJECTIVE:  CHIEF COMPLAINT:   Chief Complaint  Patient presents with  . Weakness   Patient here with weakness and noted to be in acute on chronic renal failure with hyperkalemia. Renal function is improved and potassium level has trended down.  REVIEW OF SYSTEMS:    Review of Systems  Constitutional: Negative for fever and chills.  HENT: Negative for congestion and tinnitus.   Eyes: Negative for blurred vision and double vision.  Respiratory: Negative for cough, shortness of breath and wheezing.   Cardiovascular: Negative for chest pain, orthopnea and PND.  Gastrointestinal: Negative for nausea, vomiting, abdominal pain and diarrhea.  Genitourinary: Negative for dysuria and hematuria.  Neurological: Positive for weakness (generalized). Negative for dizziness, sensory change and focal weakness.  All other systems reviewed and are negative.   Nutrition: heart healthy/Carb modified Tolerating Diet: Yes Tolerating PT: Ambulatory  DRUG ALLERGIES:   Allergies  Allergen Reactions  . Erythromycin Hives    VITALS:  Blood pressure 115/57, pulse 70, temperature 97.5 F (36.4 C), temperature source Oral, resp. rate 18, height  (1.676 m), weight 161.027 kg (355 lb), SpO2 97 %.  PHYSICAL EXAMINATION:   Physical Exam  GENERAL:  64 y.o.-year-old morbidly obese patient sitting up in chair in no acute distress.  EYES: Pupils equal, round, reactive to light and accommodation. No scleral icterus. Extraocular muscles intact.  HEENT: Head atraumatic, normocephalic. Oropharynx and nasopharynx clear.  NECK:  Supple, no jugular venous distention. No thyroid enlargement, no tenderness.  LUNGS: Normal breath sounds bilaterally, no wheezing, rales, rhonchi. No use of accessory muscles of respiration.  CARDIOVASCULAR: S1, S2 normal. No murmurs,  rubs, or gallops.  ABDOMEN: Soft, nontender, nondistended. Bowel sounds present. No organomegaly or mass.  EXTREMITIES: No cyanosis, clubbing or edema b/l.    NEUROLOGIC: Cranial nerves II through XII are intact. No focal Motor or sensory deficits b/l.   PSYCHIATRIC: The patient is alert and oriented x 3. Good affect.  SKIN: No obvious rash, lesion, or ulcer.    LABORATORY PANEL:   CBC  Recent Labs Lab 08/20/15 0715  WBC 6.4  HGB 11.0*  HCT 33.2*  PLT 182   ------------------------------------------------------------------------------------------------------------------  Chemistries   Recent Labs Lab 08/19/15 1205  08/20/15 0715  NA 135  --  137  K 6.3*  6.3*  < > 5.4*  CL 100*  --  106  CO2 25  --  25  GLUCOSE 256*  --  131*  BUN 66*  --  58*  CREATININE 2.11*  --  1.76*  CALCIUM 9.0  --  8.0*  AST 44*  --   --   ALT 46  --   --   ALKPHOS 80  --   --   BILITOT 0.6  --   --   < > = values in this interval not displayed. ------------------------------------------------------------------------------------------------------------------  Cardiac Enzymes No results for input(s): TROPONINI in the last 168 hours. ------------------------------------------------------------------------------------------------------------------  RADIOLOGY:  Dg Chest 2 View  08/19/2015  CLINICAL DATA:  Productive cough for 1 week EXAM: CHEST  2 VIEW COMPARISON:  11/14/2014 FINDINGS: Cardiomegaly again noted. No acute infiltrate or pulmonary edema. Degenerative changes thoracic spine again noted. Probable chronic mild interstitial prominence bilaterally. IMPRESSION: No active disease.  Degenerative changes thoracic spine. Electronically Signed   By: Natasha Mead M.D.   On:  08/19/2015 12:49     ASSESSMENT AND PLAN:   64 year old male with past medical history of diabetes, hypertension, morbid obesity, obstructive sleep apnea, chronic kidney disease, who presents to the hospital due to  weakness and noted to be in acute on chronic renal failure with hyperkalemia.  #1 hyperkalemia-this is likely secondary to patient's mild acute on chronic renal failure combined with over supplementation with potassium. -Patient's potassium level has improved since yesterday. We'll continue to monitor. No EKG changes, no arrhythmia.  #2 acute on chronic renal failure-this is likely secondary to overdiuresis. Baseline creatinine around 1.5. Patient presented with a creatinine of 2.1 and now down to 1.7. -Appreciate nephrology input. Continue IV fluids, hold lisinopril and potassium supplements.  #3 CHF-acute on chronic diastolic dysfunction. Clinically euvolemic. -Hold Lasix, ACE inhibitor due to the acute renal failure.  #4 diabetes type 2 without complication-continue sliding scale insulin.  #5 hyperlipidemia-continue atorvastatin.  #6 hypertension-continue metoprolol.  #7 gout-no acute attack. Hold colchicine given renal failure.   All the records are reviewed and case discussed with Care Management/Social Workerr. Management plans discussed with the patient, family and they are in agreement.  CODE STATUS: Full  DVT Prophylaxis: Heparin subcutaneous  TOTAL TIME TAKING CARE OF THIS PATIENT: 30 minutes.   POSSIBLE D/C IN 1-2 DAYS, DEPENDING ON CLINICAL CONDITION.   Houston SirenSAINANI,VIVEK J M.D on 08/20/2015 at 4:04 PM  Between 7am to 6pm - Pager - [phone]  After 6pm go to [email or web address] - password [email or web address]  Fabio Ne[facility] [place] [facility]  Office  [phone]  CC: Primary care physician; Pcp Not In System

## 2015-08-21 LAB — BASIC METABOLIC PANEL
Anion gap: 7 (ref 5–15)
BUN: 38 mg/dL — AB (ref 6–20)
CO2: 25 mmol/L (ref 22–32)
Calcium: 8.9 mg/dL (ref 8.9–10.3)
Chloride: 107 mmol/L (ref 101–111)
Creatinine, Ser: 1.2 mg/dL (ref 0.61–1.24)
GFR calc Af Amer: >60 mL/min (ref >60)
GLUCOSE: 150 mg/dL — AB (ref 65–99)
POTASSIUM: 5.3 mmol/L — AB (ref 3.5–5.1)
Sodium: 139 mmol/L (ref 135–145)

## 2015-08-21 LAB — GLUCOSE, CAPILLARY: Glucose-Capillary: 134 mg/dL — ABNORMAL HIGH (ref 65–99)

## 2015-08-21 MED ORDER — TRAZODONE HCL 50 MG PO TABS: 150.0000 mg | ORAL_TABLET | Freq: Every day | ORAL | Status: DC

## 2015-08-21 NOTE — Discharge Summary (Signed)
[facility] [place] at [facility]   PATIENT NAME: Aaron OverlieWilliam Ball    MR#:  [id number]  DATE OF BIRTH:  1951-03-07  DATE OF ADMISSION:  08/19/2015 ADMITTING PHYSICIAN: Altamese DillingVaibhavkumar Vachhani, MD  DATE OF DISCHARGE: 08/21/2015 11:48 AM  PRIMARY CARE PHYSICIAN: Pcp Not In System    ADMISSION DIAGNOSIS:  Acute hyperkalemia [E87.5] Acute renal failure, unspecified acute renal failure type (HCC) [N17.9]  DISCHARGE DIAGNOSIS:  Principal Problem:   ARF (acute renal failure) (HCC) Active Problems:   Hyperkalemia   Diarrhea   SECONDARY DIAGNOSIS:   Past Medical History  Diagnosis Date  . Coronary artery disease     s/p stent in RCA.  He has six stents.  . Hypertension   . Arrhythmia 04/2011    s/p A-fib ablation.  . Hyperlipidemia   . Wears hearing aid     right ear  . Diabetes mellitus without complication [facility](HCC)     HOSPITAL COURSE:   64 year old male with past medical history of diabetes, hypertension, morbid obesity, obstructive sleep apnea, chronic kidney disease, who presents to the hospital due to weakness and noted to be in acute on chronic renal failure with hyperkalemia.  #1 hyperkalemia-this was likely secondary to patient's mild acute on chronic renal failure combined with over supplementation with potassium. -After getting some IV fluids, Kayexalate and as his renal function has improved his potassium has come down. He had no arrhythmias or EKG changes consistent with hyperkalemia.  #2 acute on chronic renal failure-this was secondary to overdiuresis. Patient's diuretics were held and he was given IV fluids and his BUN and creatinine and a back to baseline. -Patient was seen by nephrology who recommended holding his ACE inhibitor until the follow-up with him as an outpatient. Patient was taken off his lisinopril/Vasotec until seen by nephrology. He will have his renal function checked at the nephrologist office.  #3 CHF-acute on chronic  diastolic dysfunction. Clinically euvolemic while in the hospital. -He will resume his torsemide, potassium supplements upon discharge.  -His ACE inhibitor will be held until seen by nephrology.  #4 diabetes type 2 without complication-while in the hospital he was on sliding scale insulin but he will resume his metformin upon discharge as his renal function is back to baseline.  #5 hyperlipidemia-he will continue atorvastatin.  #6 hypertension-he will continue metoprolol.  #7 gout-no acute attack. He will resume his colchicine.  DISCHARGE CONDITIONS:   Stable  CONSULTS OBTAINED:  Treatment Team:  Lamont DowdySarath Kolluru, MD  DRUG ALLERGIES:   Allergies  Allergen Reactions  . Erythromycin Hives    DISCHARGE MEDICATIONS:   Discharge Medication List as of 08/21/2015 10:24 AM    CONTINUE these medications which have NOT CHANGED   Details  aspirin 81 MG tablet Take 162 mg daily. , Until Discontinued, Historical Med    atorvastatin (LIPITOR) 40 MG tablet Take 1 tablet by mouth daily., Starting 08/10/2015, Until Discontinued, Historical Med    colchicine 0.6 MG tablet Take 0.6 mg by mouth daily., Starting 07/21/2015, Until Discontinued, Historical Med    KLOR-CON M20 20 MEQ tablet Take 20 mEq by mouth daily. , Starting 07/26/2015, Until Discontinued, Historical Med    loratadine (CLARITIN) 10 MG tablet Take 10 mg by mouth daily., Until Discontinued, Historical Med    metFORMIN (GLUCOPHAGE) 500 MG tablet Take 1 tablet by mouth 2 (two) times daily., Starting 08/03/2015, Until Discontinued, Historical Med    metoprolol succinate (TOPROL-XL) 25 MG 24 hr tablet Take 1 tablet by mouth daily.,  Starting 07/26/2015, Until Discontinued, Historical Med    nitroGLYCERIN (NITROSTAT) 0.4 MG SL tablet Place 0.4 mg under the tongue every 5 (five) minutes as needed. , Starting 07/08/2015, Until Discontinued, Historical Med    torsemide (DEMADEX) 20 MG tablet Take 1 tablet by mouth 3 (three) times  daily., Starting 08/07/2015, Until Discontinued, Historical Med    traZODone (DESYREL) 100 MG tablet Take 1.5 tablets by mouth daily., Starting 08/19/2015, Until Discontinued, Historical Med    oxyCODONE-acetaminophen (ROXICET) 5-325 MG tablet Take 1 tablet by mouth every 6 (six) hours as needed., Starting 07/10/2015, Until Sat 07/09/16, Print      STOP taking these medications     lisinopril (PRINIVIL,ZESTRIL) 10 MG tablet      enalapril (VASOTEC) 10 MG tablet      potassium chloride (K-DUR) 10 MEQ tablet          DISCHARGE INSTRUCTIONS:   DIET:  Cardiac diet and Diabetic diet  DISCHARGE CONDITION:  Stable  ACTIVITY:  Activity as tolerated  OXYGEN:  Home Oxygen: No.   Oxygen Delivery: room air  DISCHARGE LOCATION:  Home with home health OT, PT, Aide.   If you experience worsening of your admission symptoms, develop shortness of breath, life threatening emergency, suicidal or homicidal thoughts you must seek medical attention immediately by calling 911 or calling your MD immediately  if symptoms less severe.  You Must read complete instructions/literature along with all the possible adverse reactions/side effects for all the Medicines you take and that have been prescribed to you. Take any new Medicines after you have completely understood and accpet all the possible adverse reactions/side effects.   Please note  You were cared for by a hospitalist during your hospital stay. If you have any questions about your discharge medications or the care you received while you were in the hospital after you are discharged, you can call the unit and asked to speak with the hospitalist on call if the hospitalist that took care of you is not available. Once you are discharged, your primary care physician will handle any further medical issues. Please note that NO REFILLS for any discharge medications will be authorized once you are discharged, as it is imperative that you return to your  primary care physician (or establish a relationship with a primary care physician if you do not have one) for your aftercare needs so that they can reassess your need for medications and monitor your lab values.     Today   Renal function back to baseline. No nausea, vomiting, chest pain or any other associated symptoms.  VITAL SIGNS:  Blood pressure 118/78, pulse 65, temperature 98 F (36.7 C), temperature source Oral, resp. rate 18, height  (1.676 m), weight 161.027 kg (355 lb), SpO2 92 %.  I/O:   Intake/Output Summary (Last 24 hours) at 08/21/15 1558 Last data filed at 08/21/15 0947  Gross per 24 hour  Intake    600 ml  Output   1400 ml  Net   -800 ml    PHYSICAL EXAMINATION:   GENERAL: 64 y.o.-year-old morbidly obese patient sitting up in chair in no acute distress.  EYES: Pupils equal, round, reactive to light and accommodation. No scleral icterus. Extraocular muscles intact.  HEENT: Head atraumatic, normocephalic. Oropharynx and nasopharynx clear.  NECK: Supple, no jugular venous distention. No thyroid enlargement, no tenderness.  LUNGS: Normal breath sounds bilaterally, no wheezing, rales, rhonchi. No use of accessory muscles of respiration.  CARDIOVASCULAR: S1, S2 normal. No murmurs,  rubs, or gallops.  ABDOMEN: Soft, nontender, nondistended. Bowel sounds present. No organomegaly or mass.  EXTREMITIES: No cyanosis, clubbing, chronic 1-2 + edema b/l.   NEUROLOGIC: Cranial nerves II through XII are intact. No focal Motor or sensory deficits b/l.  PSYCHIATRIC: The patient is alert and oriented x 3. Good affect.  SKIN: No obvious rash, lesion, or ulcer.   DATA REVIEW:   CBC  Recent Labs Lab 08/20/15 0715  WBC 6.4  HGB 11.0*  HCT 33.2*  PLT 182    Chemistries   Recent Labs Lab 08/19/15 1205  08/21/15 0539  NA 135  < > 139  K 6.3*  6.3*  < > 5.3*  CL 100*  < > 107  CO2 25  < > 25  GLUCOSE 256*  < > 150*  BUN 66*  < > 38*  CREATININE  2.11*  < > 1.20  CALCIUM 9.0  < > 8.9  AST 44*  --   --   ALT 46  --   --   ALKPHOS 80  --   --   BILITOT 0.6  --   --   < > = values in this interval not displayed.  Cardiac Enzymes No results for input(s): TROPONINI in the last 168 hours.  Microbiology Results  Results for orders placed or performed during the hospital encounter of 08/19/15  Stool culture     Status: None (Preliminary result)   Collection Time: 08/19/15  8:30 PM  Result Value Ref Range Status   Specimen Description STOOL  Final   Special Requests Normal  Final   Culture   Final    HOLDING FOR POSSIBLE PATHOGEN No Pathogenic E. coli detected NO CAMPYLOBACTER DETECTED    Report Status PENDING  Incomplete  C difficile quick scan w PCR reflex     Status: None   Collection Time: 08/19/15  8:30 PM  Result Value Ref Range Status   C Diff antigen NEGATIVE NEGATIVE Final   C Diff toxin NEGATIVE NEGATIVE Final   C Diff interpretation Negative for C. difficile  Final    RADIOLOGY:  No results found.    Management plans discussed with the patient, family and they are in agreement.  CODE STATUS:   TOTAL TIME TAKING CARE OF THIS PATIENT: 40 minutes.    Houston Siren M.D on 08/21/2015 at 3:58 PM  Between 7am to 6pm - Pager - [phone]  After 6pm go to [email or web address] - password [email or web address]  [facility]  Office  [phone]  CC: Primary care physician; Pcp Not In System

## 2015-08-21 NOTE — Progress Notes (Signed)
SATURATION QUALIFICATIONS: (This note is used to comply with regulatory documentation for home oxygen)  Patient Saturations on Room Air at Rest = 99%  Patient Saturations on Room Air while Ambulating = 92%  Patient Saturations on NA Liters of oxygen while Ambulating = NA%  Please briefly explain why patient needs home oxygen:

## 2015-08-21 NOTE — Care Management (Signed)
Patient is currently followed by [facility]. Contacted agency and informed of admission and discharge from [facility].   Made referral for SN PT OT and aide. Requested  orders from attending and faxed referral to agency

## 2015-08-21 NOTE — Progress Notes (Deleted)
Dr. Juliene PinaMody paged and made aware of UA results. MD to place orders and will continue with discharge.

## 2015-08-21 NOTE — Progress Notes (Signed)
[facility]  ROUNDING NOTE   Subjective:   Sitting up in chair. Son and daughter in law at bedside.  Diarrhea has improved.  Off IV fluids K 5.3 (5.4) (6.3) Creatinine 1.2 (1.76) (2.11)  Objective:  Vital signs in last 24 hours:  Temp:  [97.5 F (36.4 C)-98 F (36.7 C)] 98 F (36.7 C) (12/09 0402) Pulse Rate:  [65-70] 65 (12/09 0741) Resp:  [18-22] 18 (12/09 0741) BP: (110-118)/(46-78) 118/78 mmHg (12/09 0741) SpO2:  [93 %-100 %] 93 % (12/09 0741)  Weight change:  Filed Weights   08/19/15 1149 08/19/15 1627  Weight: 157.852 kg (348 lb) 161.027 kg (355 lb)    Intake/Output: I/O last 3 completed shifts: In: 1783 [P.O.:240; I.V.:1543] Out: 1400 [Urine:1400]   Intake/Output this shift:     Physical Exam: General: NAD, morbidly obese  Head: Normocephalic, atraumatic. Moist oral mucosal membranes  Eyes: Anicteric, PERRL  Neck: Supple, trachea midline  Lungs:  Distant lung sounds  Heart: Regular rate and rhythm  Abdomen:  Soft, nontender,   Extremities: 1-2+ peripheral edema.  Neurologic: Nonfocal, moving all four extremities  Skin: No lesions       Basic Metabolic Panel:  Recent Labs Lab 08/19/15 1205 08/19/15 1742 08/20/15 0715 08/21/15 0539  NA 135  --  137 139  K 6.3*  6.3* 5.7* 5.4* 5.3*  CL 100*  --  106 107  CO2 25  --  25 25  GLUCOSE 256*  --  131* 150*  BUN 66*  --  58* 38*  CREATININE 2.11*  --  1.76* 1.20  CALCIUM 9.0  --  8.0* 8.9    Liver Function Tests:  Recent Labs Lab 08/19/15 1205  AST 44*  ALT 46  ALKPHOS 80  BILITOT 0.6  PROT 7.8  ALBUMIN 4.3    Recent Labs Lab 08/19/15 1205  LIPASE 59*   No results for input(s): AMMONIA in the last 168 hours.  CBC:  Recent Labs Lab 08/19/15 1205 08/20/15 0715  WBC 7.3 6.4  NEUTROABS 5.8  --   HGB 12.3* 11.0*  HCT 36.9* 33.2*  MCV 90.4 91.1  PLT 209 182    Cardiac Enzymes:  Recent Labs Lab 08/19/15 1205  CKTOTAL 218    BNP: Invalid input(s):  POCBNP  CBG:  Recent Labs Lab 08/20/15 0740 08/20/15 1132 08/20/15 1707 08/20/15 2057 08/21/15 0745  GLUCAP 111* 145* 111* 131* 134*    Microbiology: Results for orders placed or performed during the hospital encounter of 08/19/15  Stool culture     Status: None (Preliminary result)   Collection Time: 08/19/15  8:30 PM  Result Value Ref Range Status   Specimen Description STOOL  Final   Special Requests Normal  Final   Culture   Final    HOLDING FOR POSSIBLE PATHOGEN No Pathogenic E. coli detected NO CAMPYLOBACTER DETECTED    Report Status PENDING  Incomplete  C difficile quick scan w PCR reflex     Status: None   Collection Time: 08/19/15  8:30 PM  Result Value Ref Range Status   C Diff antigen NEGATIVE NEGATIVE Final   C Diff toxin NEGATIVE NEGATIVE Final   C Diff interpretation Negative for C. difficile  Final    Coagulation Studies: No results for input(s): LABPROT, INR in the last 72 hours.  Urinalysis:  Recent Labs  08/20/15 1410  COLORURINE STRAW*  LABSPEC 1.012  PHURINE 7.0  GLUCOSEU NEGATIVE  HGBUR NEGATIVE  BILIRUBINUR NEGATIVE  KETONESUR NEGATIVE  PROTEINUR  NEGATIVE  NITRITE NEGATIVE  LEUKOCYTESUR NEGATIVE      Imaging: Dg Chest 2 View  08/19/2015  CLINICAL DATA:  Productive cough for 1 week EXAM: CHEST  2 VIEW COMPARISON:  11/14/2014 FINDINGS: Cardiomegaly again noted. No acute infiltrate or pulmonary edema. Degenerative changes thoracic spine again noted. Probable chronic mild interstitial prominence bilaterally. IMPRESSION: No active disease.  Degenerative changes thoracic spine. Electronically Signed   By: Natasha MeadLiviu  Pop M.D.   On: 08/19/2015 12:49     Medications:   . sodium chloride Stopped (08/21/15 0900)   . aspirin EC  81 mg Oral Daily  . atorvastatin  40 mg Oral Daily  . heparin  5,000 Units Subcutaneous 3 times per day  . insulin aspart  0-9 Units Subcutaneous TID WC  . loratadine  10 mg Oral Daily  . metoprolol succinate  25 mg  Oral Daily  . sodium chloride  3 mL Intravenous Q12H  . traZODone  150 mg Oral QHS   oxyCODONE-acetaminophen, sodium chloride  Assessment/ Plan:  [name] is a 64 y.o. white male with coronary artery disease, hypertension, congestive heart failure, diabetes mellitus type II, hearing loss, cholecystectomy, right hip fracture, morbid obesity, obstructive sleep apnea on CPAP, GERD and gout who was [facility] 08/19/2015 for Acute hyperkalemia [E87.5] and Acute renal failure, unspecified acute renal failure type (HCC) [N17.9]   1. Acute renal failure on chronic kidney disease stage III with hyperkalemia: hyperkalemia secondary to iatrogenic potassium.  Status post shifting and kayexalate. Potassium now improved but upper level of normal. Avoid potassium supplements. Holding lisinopril and enalapril.  Acute renal failure from overdiuresis and diarrhea Chronic kidney disease secondary to hypertension and diabetes - Patient will need follow up labs next week in my office and then follow up with me after that. Appointment for 12/12 8:40am  2. Hypertension, edema, congestive heart failure diastolic - monitor volume status closely.  - low salt diet.  - metoprolol - torsemide to be reinitiated on discharge.    LOS: 2 Aaron Ball 12/9/20169:49 AM

## 2015-08-21 NOTE — Discharge Instructions (Addendum)
DIET:  Cardiac diet  DISCHARGE CONDITION:  Stable  ACTIVITY:  Activity as tolerated  OXYGEN:  Home Oxygen: No.   Oxygen Delivery: room air  DISCHARGE LOCATION:  home   If you experience worsening of your admission symptoms, develop shortness of breath, life threatening emergency, suicidal or homicidal thoughts you must seek medical attention immediately by calling 911 or calling your MD immediately  if symptoms less severe.  You Must read complete instructions/literature along with all the possible adverse reactions/side effects for all the Medicines you take and that have been prescribed to you. Take any new Medicines after you have completely understood and accpet all the possible adverse reactions/side effects.   Please note  You were cared for by a hospitalist during your hospital stay. If you have any questions about your discharge medications or the care you received while you were in the hospital after you are discharged, you can call the unit and asked to speak with the hospitalist on call if the hospitalist that took care of you is not available. Once you are discharged, your primary care physician will handle any further medical issues. Please note that NO REFILLS for any discharge medications will be authorized once you are discharged, as it is imperative that you return to your primary care physician (or establish a relationship with a primary care physician if you do not have one) for your aftercare needs so that they can reassess your need for medications and monitor your lab values.  Heart Failure Heart failure is a condition in which the heart has trouble pumping blood. This means your heart does not pump blood efficiently for your body to work well. In some cases of heart failure, fluid may back up into your lungs or you may have swelling (edema) in your lower legs. Heart failure is usually a long-term (chronic) condition. It is important for you to take good care of  yourself and follow your health care provider's treatment plan. CAUSES  Some health conditions can cause heart failure. Those health conditions include:  High blood pressure (hypertension). Hypertension causes the heart muscle to work harder than normal. When pressure in the blood vessels is high, the heart needs to pump (contract) with more force in order to circulate blood throughout the body. High blood pressure eventually causes the heart to become stiff and weak.  Coronary artery disease (CAD). CAD is the buildup of cholesterol and fat (plaque) in the arteries of the heart. The blockage in the arteries deprives the heart muscle of oxygen and blood. This can cause chest pain and may lead to a heart attack. High blood pressure can also contribute to CAD.  Heart attack (myocardial infarction). A heart attack occurs when one or more arteries in the heart become blocked. The loss of oxygen damages the muscle tissue of the heart. When this happens, part of the heart muscle dies. The injured tissue does not contract as well and weakens the heart's ability to pump blood.  Abnormal heart valves. When the heart valves do not open and close properly, it can cause heart failure. This makes the heart muscle pump harder to keep the blood flowing.  Heart muscle disease (cardiomyopathy or myocarditis). Heart muscle disease is damage to the heart muscle from a variety of causes. These can include drug or alcohol abuse, infections, or unknown reasons. These can increase the risk of heart failure.  Lung disease. Lung disease makes the heart work harder because the lungs do not work properly. This  can cause a strain on the heart, leading it to fail.  Diabetes. Diabetes increases the risk of heart failure. High blood sugar contributes to high fat (lipid) levels in the blood. Diabetes can also cause slow damage to tiny blood vessels that carry important nutrients to the heart muscle. When the heart does not get  enough oxygen and food, it can cause the heart to become weak and stiff. This leads to a heart that does not contract efficiently.  Other conditions can contribute to heart failure. These include abnormal heart rhythms, thyroid problems, and low blood counts (anemia). Certain unhealthy behaviors can increase the risk of heart failure, including:  Being overweight.  Smoking or chewing tobacco.  Eating foods high in fat and cholesterol.  Abusing illicit drugs or alcohol.  Lacking physical activity. SYMPTOMS  Heart failure symptoms may vary and can be hard to detect. Symptoms may include:  Shortness of breath with activity, such as climbing stairs.  Persistent cough.  Swelling of the feet, ankles, legs, or abdomen.  Unexplained weight gain.  Difficulty breathing when lying flat (orthopnea).  Waking from sleep because of the need to sit up and get more air.  Rapid heartbeat.  Fatigue and loss of energy.  Feeling light-headed, dizzy, or close to fainting.  Loss of appetite.  Nausea.  Increased urination during the night (nocturia). DIAGNOSIS  A diagnosis of heart failure is based on your history, symptoms, physical examination, and diagnostic tests. Diagnostic tests for heart failure may include:  Echocardiography.  Electrocardiography.  Chest X-ray.  Blood tests.  Exercise stress test.  Cardiac angiography.  Radionuclide scans. TREATMENT  Treatment is aimed at managing the symptoms of heart failure. Medicines, behavioral changes, or surgical intervention may be necessary to treat heart failure.  Medicines to help treat heart failure may include:  Angiotensin-converting enzyme (ACE) inhibitors. This type of medicine blocks the effects of a blood protein called angiotensin-converting enzyme. ACE inhibitors relax (dilate) the blood vessels and help lower blood pressure.  Angiotensin receptor blockers (ARBs). This type of medicine blocks the actions of a blood  protein called angiotensin. Angiotensin receptor blockers dilate the blood vessels and help lower blood pressure.  Water pills (diuretics). Diuretics cause the kidneys to remove salt and water from the blood. The extra fluid is removed through urination. This loss of extra fluid lowers the volume of blood the heart pumps.  Beta blockers. These prevent the heart from beating too fast and improve heart muscle strength.  Digitalis. This increases the force of the heartbeat.  Healthy behavior changes include:  Obtaining and maintaining a healthy weight.  Stopping smoking or chewing tobacco.  Eating heart-healthy foods.  Limiting or avoiding alcohol.  Stopping illicit drug use.  Physical activity as directed by your health care provider.  Surgical treatment for heart failure may include:  A procedure to open blocked arteries, repair damaged heart valves, or remove damaged heart muscle tissue.  A pacemaker to improve heart muscle function and control certain abnormal heart rhythms.  An internal cardioverter defibrillator to treat certain serious abnormal heart rhythms.  A left ventricular assist device (LVAD) to assist the pumping ability of the heart. HOME CARE INSTRUCTIONS   Take medicines only as directed by your health care provider. Medicines are important in reducing the workload of your heart, slowing the progression of heart failure, and improving your symptoms.  Do not stop taking your medicine unless directed by your health care provider.  Do not skip any dose of medicine.  Refill your prescriptions before you run out of medicine. Your medicines are needed every day.  Engage in moderate physical activity if directed by your health care provider. Moderate physical activity can benefit some people. The elderly and people with severe heart failure should consult with a health care provider for physical activity recommendations.  Eat heart-healthy foods. Food choices should  be free of trans fat and low in saturated fat, cholesterol, and salt (sodium). Healthy choices include fresh or frozen fruits and vegetables, fish, lean meats, legumes, fat-free or low-fat dairy products, and whole grain or high fiber foods. Talk to a dietitian to learn more about heart-healthy foods.  Limit sodium if directed by your health care provider. Sodium restriction may reduce symptoms of heart failure in some people. Talk to a dietitian to learn more about heart-healthy seasonings.  Use healthy cooking methods. Healthy cooking methods include roasting, grilling, broiling, baking, poaching, steaming, or stir-frying. Talk to a dietitian to learn more about healthy cooking methods.  Limit fluids if directed by your health care provider. Fluid restriction may reduce symptoms of heart failure in some people.  Weigh yourself every day. Daily weights are important in the early recognition of excess fluid. You should weigh yourself every morning after you urinate and before you eat breakfast. Wear the same amount of clothing each time you weigh yourself. Record your daily weight. Provide your health care provider with your weight record.  Monitor and record your blood pressure if directed by your health care provider.  Check your pulse if directed by your health care provider.  Lose weight if directed by your health care provider. Weight loss may reduce symptoms of heart failure in some people.  Stop smoking or chewing tobacco. Nicotine makes your heart work harder by causing your blood vessels to constrict. Do not use nicotine gum or patches before talking to your health care provider.  Keep all follow-up visits as directed by your health care provider. This is important.  Limit alcohol intake to no more than 1 drink per day for nonpregnant women and 2 drinks per day for men. One drink equals 12 ounces of beer, 5 ounces of wine, or 1 ounces of hard liquor. Drinking more than that is harmful  to your heart. Tell your health care provider if you drink alcohol several times a week. Talk with your health care provider about whether alcohol is safe for you. If your heart has already been damaged by alcohol or you have severe heart failure, drinking alcohol should be stopped completely.  Stop illicit drug use.  Stay up-to-date with immunizations. It is especially important to prevent respiratory infections through current pneumococcal and influenza immunizations.  Manage other health conditions such as hypertension, diabetes, thyroid disease, or abnormal heart rhythms as directed by your health care provider.  Learn to manage stress.  Plan rest periods when fatigued.  Learn strategies to manage high temperatures. If the weather is extremely hot:  Avoid vigorous physical activity.  Use air conditioning or fans or seek a cooler location.  Avoid caffeine and alcohol.  Wear loose-fitting, lightweight, and light-colored clothing.  Learn strategies to manage cold temperatures. If the weather is extremely cold:  Avoid vigorous physical activity.  Layer clothes.  Wear mittens or gloves, a hat, and a scarf when going outside.  Avoid alcohol.  Obtain ongoing education and support as needed.  Participate in or seek rehabilitation as needed to maintain or improve independence and quality of life. SEEK MEDICAL CARE IF:  You have a rapid weight gain.  You have increasing shortness of breath that is unusual for you.  You are unable to participate in your usual physical activities.  You tire easily.  You cough more than normal, especially with physical activity.  You have any or more swelling in areas such as your hands, feet, ankles, or abdomen.  You are unable to sleep because it is hard to breathe.  You feel like your heart is beating fast (palpitations).  You become dizzy or light-headed upon standing up. SEEK IMMEDIATE MEDICAL CARE IF:   You have difficulty  breathing.  There is a change in mental status such as decreased alertness or difficulty with concentration.  You have a pain or discomfort in your chest.  You have an episode of fainting (syncope). MAKE SURE YOU:   Understand these instructions.  Will watch your condition.  Will get help right away if you are not doing well or get worse.   This information is not intended to replace advice given to you by your health care provider. Make sure you discuss any questions you have with your health care provider.   Document Released: 08/29/2005 Document Revised: 01/13/2015 Document Reviewed: 09/28/2012 Elsevier Interactive Patient Education 2016 [employer].   Daily Weight Record It is important to weigh yourself daily. Keep this daily weight chart near your scale. Weigh yourself each morning at the same time. Weigh yourself without shoes, and wear the same amount of clothing each day. Compare today's weight to yesterday's weight. Bring this form with you to your follow-up appointments. Call your health care provider if you have concerns about your weight, including rapid weight gain or rapid weight loss. Date: ________ Weight: ____________________ Date: ________ Weight: ____________________ Date: ________ Weight: ____________________ Date: ________ Weight: ____________________ Date: ________ Weight: ____________________ Date: ________ Weight: ____________________ Date: ________ Weight: ____________________ Date: ________ Weight: ____________________ Date: ________ Weight: ____________________ Date: ________ Weight: ____________________ Date: ________ Weight: ____________________ Date: ________ Weight: ____________________ Date: ________ Weight: ____________________ Date: ________ Weight: ____________________ Date: ________ Weight: ____________________ Date: ________ Weight: ____________________ Date: ________ Weight: ____________________ Date: ________ Weight:  ____________________ Date: ________ Weight: ____________________ Date: ________ Weight: ____________________ Date: ________ Weight: ____________________ Date: ________ Weight: ____________________ Date: ________ Weight: ____________________ Date: ________ Weight: ____________________ Date: ________ Weight: ____________________ Date: ________ Weight: ____________________ Date: ________ Weight: ____________________ Date: ________ Weight: ____________________ Date: ________ Weight: ____________________ Date: ________ Weight: ____________________ Date: ________ Weight: ____________________ Date: ________ Weight: ____________________ Date: ________ Weight: ____________________ Date: ________ Weight: ____________________ Date: ________ Weight: ____________________ Date: ________ Weight: ____________________ Date: ________ Weight: ____________________ Date: ________ Weight: ____________________ Date: ________ Weight: ____________________ Date: ________ Weight: ____________________ Date: ________ Weight: ____________________ Date: ________ Weight: ____________________ Date: ________ Weight: ____________________ Date: ________ Weight: ____________________ Date: ________ Weight: ____________________ Date: ________ Weight: ____________________ Date: ________ Weight: ____________________ Date: ________ Weight: ____________________ Date: ________ Weight: ____________________ Date: ________ Weight: ____________________   This information is not intended to replace advice given to you by your health care provider. Make sure you discuss any questions you have with your health care provider.   Document Released: 11/10/2006 Document Revised: 09/19/2014 Document Reviewed: 03/28/2014 Elsevier Interactive Patient Education 2016 [employer]  Heart-Healthy Eating Plan Many factors influence your heart health, including eating and exercise habits. Heart (coronary) risk increases with abnormal blood fat (lipid)  levels. Heart-healthy meal planning includes limiting unhealthy fats, increasing healthy fats, and making other small dietary changes. This includes maintaining a healthy body weight to help keep lipid levels within a normal range. WHAT IS  MY PLAN?  Your health care provider recommends that you:  Get no more than _________% of the total calories in your daily diet from fat.  Limit your intake of saturated fat to less than _________% of your total calories each day.  Limit the amount of cholesterol in your diet to less than _________ mg per day. WHAT TYPES OF FAT SHOULD I CHOOSE?  Choose healthy fats more often. Choose monounsaturated and polyunsaturated fats, such as olive oil and canola oil, flaxseeds, walnuts, almonds, and seeds.  Eat more omega-3 fats. Good choices include salmon, mackerel, sardines, tuna, flaxseed oil, and ground flaxseeds. Aim to eat fish at least two times each week.  Limit saturated fats. Saturated fats are primarily found in animal products, such as meats, butter, and cream. Plant sources of saturated fats include palm oil, palm kernel oil, and coconut oil.  Avoid foods with partially hydrogenated oils in them. These contain trans fats. Examples of foods that contain trans fats are stick margarine, some tub margarines, cookies, crackers, and other baked goods. WHAT GENERAL GUIDELINES DO I NEED TO FOLLOW?  Check food labels carefully to identify foods with trans fats or high amounts of saturated fat.  Fill one half of your plate with vegetables and green salads. Eat 4-5 servings of vegetables per day. A serving of vegetables equals 1 cup of raw leafy vegetables,  cup of raw or cooked cut-up vegetables, or  cup of vegetable juice.  Fill one fourth of your plate with whole grains. Look for the word "whole" as the first word in the ingredient list.  Fill one fourth of your plate with lean protein foods.  Eat 4-5 servings of fruit per day. A serving of fruit equals  one medium whole fruit,  cup of dried fruit,  cup of fresh, frozen, or canned fruit, or  cup of 100% fruit juice.  Eat more foods that contain soluble fiber. Examples of foods that contain this type of fiber are apples, broccoli, carrots, beans, peas, and barley. Aim to get 20-30 g of fiber per day.  Eat more home-cooked food and less restaurant, buffet, and fast food.  Limit or avoid alcohol.  Limit foods that are high in starch and sugar.  Avoid fried foods.  Cook foods by using methods other than frying. Baking, boiling, grilling, and broiling are all great options. Other fat-reducing suggestions include:  Removing the skin from poultry.  Removing all visible fats from meats.  Skimming the fat off of stews, soups, and gravies before serving them.  Steaming vegetables in water or broth.  Lose weight if you are overweight. Losing just 5-10% of your initial body weight can help your overall health and prevent diseases such as diabetes and heart disease.  Increase your consumption of nuts, legumes, and seeds to 4-5 servings per week. One serving of dried beans or legumes equals  cup after being cooked, one serving of nuts equals 1 ounces, and one serving of seeds equals  ounce or 1 tablespoon.  You may need to monitor your salt (sodium) intake, especially if you have high blood pressure. Talk with your health care provider or dietitian to get more information about reducing sodium. WHAT FOODS CAN I EAT? Grains Breads, including [place], white, pita, wheat, raisin, rye, oatmeal, and [place]. Tortillas that are neither fried nor made with lard or trans fat. Low-fat rolls, including hotdog and hamburger buns and English muffins. Biscuits. Muffins. Waffles. Pancakes. Light popcorn. Whole-grain cereals. Flatbread. Melba toast. Pretzels. Breadsticks. Rusks.  Low-fat snacks and crackers, including oyster, saltine, matzo, graham, animal, and rye. Rice and pasta, including brown rice and those  that are made with whole wheat. Vegetables All vegetables. Fruits All fruits, but limit coconut. Meats and Other Protein Sources Lean, well-trimmed beef, veal, pork, and lamb. Chicken and [place] without skin. All fish and shellfish. Wild duck, rabbit, pheasant, and venison. Egg whites or low-cholesterol egg substitutes. Dried beans, peas, lentils, and tofu.Seeds and most nuts. Dairy Low-fat or nonfat cheeses, including ricotta, string, and mozzarella. Skim or 1% milk that is liquid, powdered, or evaporated. Buttermilk that is made with low-fat milk. Nonfat or low-fat yogurt. Beverages Mineral water. Diet carbonated beverages. Sweets and Desserts Sherbets and fruit ices. Honey, jam, marmalade, jelly, and syrups. Meringues and gelatins. Pure sugar candy, such as hard candy, jelly beans, gumdrops, mints, marshmallows, and small amounts of dark chocolate. [employer]. Eat all sweets and desserts in moderation. Fats and Oils Nonhydrogenated (trans-free) margarines. Vegetable oils, including soybean, sesame, sunflower, olive, peanut, safflower, corn, canola, and cottonseed. Salad dressings or mayonnaise that are made with a vegetable oil. Limit added fats and oils that you use for cooking, baking, salads, and as spreads. Other Cocoa powder. Coffee and tea. All seasonings and condiments. The items listed above may not be a complete list of recommended foods or beverages. Contact your dietitian for more options. WHAT FOODS ARE NOT RECOMMENDED? Grains Breads that are made with saturated or trans fats, oils, or whole milk. Croissants. Butter rolls. Cheese breads. Sweet rolls. Donuts. Buttered popcorn. Chow mein noodles. High-fat crackers, such as cheese or butter crackers. Meats and Other Protein Sources Fatty meats, such as hotdogs, short ribs, sausage, spareribs, bacon, ribeye roast or steak, and mutton. High-fat deli meats, such as salami and bologna. Caviar. Domestic duck and goose. Organ  meats, such as kidney, liver, sweetbreads, brains, gizzard, chitterlings, and heart. Dairy Cream, sour cream, cream cheese, and creamed cottage cheese. Whole milk cheeses, including blue (bleu), [address], [place], [place], [address], [place], [address], [place], [place], and [place]. Whole or 2% milk that is liquid, evaporated, or condensed. Whole buttermilk. Cream sauce or high-fat cheese sauce. Yogurt that is made from whole milk. Beverages Regular sodas and drinks with added sugar. Sweets and Desserts Frosting. Pudding. Cookies. Cakes other than angel food cake. Candy that has milk chocolate or white chocolate, hydrogenated fat, butter, coconut, or unknown ingredients. Buttered syrups. Full-fat ice cream or ice cream drinks. Fats and Oils Gravy that has suet, meat fat, or shortening. Cocoa butter, hydrogenated oils, palm oil, coconut oil, palm kernel oil. These can often be found in baked products, candy, fried foods, nondairy creamers, and whipped toppings. Solid fats and shortenings, including bacon fat, salt pork, lard, and butter. Nondairy cream substitutes, such as coffee creamers and sour cream substitutes. Salad dressings that are made of unknown oils, cheese, or sour cream. The items listed above may not be a complete list of foods and beverages to avoid. Contact your dietitian for more information.   This information is not intended to replace advice given to you by your health care provider. Make sure you discuss any questions you have with your health care provider.   Document Released: 06/07/2008 Document Revised: 09/19/2014 Document Reviewed: 02/20/2014 Elsevier Interactive Patient Education 2016 [employer]  Low-Sodium Eating Plan Sodium raises blood pressure and causes water to be held in the body. Getting less sodium from food will help lower your blood pressure, reduce any swelling, and protect your heart, liver, and kidneys.  We get sodium by adding salt (sodium chloride) to  food. Most of our sodium comes from canned, boxed, and frozen foods. Restaurant foods, fast foods, and pizza are also very high in sodium. Even if you take medicine to lower your blood pressure or to reduce fluid in your body, getting less sodium from your food is important. WHAT IS MY PLAN? Most people should limit their sodium intake to 2,300 mg a day. Your health care provider recommends that you limit your sodium intake to __________ a day.  WHAT DO I NEED TO KNOW ABOUT THIS EATING PLAN? For the low-sodium eating plan, you will follow these general guidelines:  Choose foods with a % Daily Value for sodium of less than 5% (as listed on the food label).   Use salt-free seasonings or herbs instead of table salt or sea salt.   Check with your health care provider or pharmacist before using salt substitutes.   Eat fresh foods.  Eat more vegetables and fruits.  Limit canned vegetables. If you do use them, rinse them well to decrease the sodium.   Limit cheese to 1 oz (28 g) per day.   Eat lower-sodium products, often labeled as "lower sodium" or "no salt added."  Avoid foods that contain monosodium glutamate (MSG). MSG is sometimes added to [place] food and some canned foods.  Check food labels (Nutrition Facts labels) on foods to learn how much sodium is in one serving.  Eat more home-cooked food and less restaurant, buffet, and fast food.  When eating at a restaurant, ask that your food be prepared with less salt, or no salt if possible.  HOW DO I READ FOOD LABELS FOR SODIUM INFORMATION? The Nutrition Facts label lists the amount of sodium in one serving of the food. If you eat more than one serving, you must multiply the listed amount of sodium by the number of servings. Food labels may also identify foods as:  Sodium free--Less than 5 mg in a serving.  Very low sodium--35 mg or less in a serving.  Low sodium--140 mg or less in a serving.  Light in sodium--50% less  sodium in a serving. For example, if a food that usually has 300 mg of sodium is changed to become light in sodium, it will have 150 mg of sodium.  Reduced sodium--25% less sodium in a serving. For example, if a food that usually has 400 mg of sodium is changed to reduced sodium, it will have 300 mg of sodium. WHAT FOODS CAN I EAT? Grains Low-sodium cereals, including oats, puffed wheat and rice, and shredded wheat cereals. Low-sodium crackers. Unsalted rice and pasta. Lower-sodium bread.  Vegetables Frozen or fresh vegetables. Low-sodium or reduced-sodium canned vegetables. Low-sodium or reduced-sodium tomato sauce and paste. Low-sodium or reduced-sodium tomato and vegetable juices.  Fruits Fresh, frozen, and canned fruit. Fruit juice.  Meat and Other Protein Products Low-sodium canned tuna and salmon. Fresh or frozen meat, poultry, seafood, and fish. Lamb. Unsalted nuts. Dried beans, peas, and lentils without added salt. Unsalted canned beans. Homemade soups without salt. Eggs.  Dairy Milk. Soy milk. Ricotta cheese. Low-sodium or reduced-sodium cheeses. Yogurt.  Condiments Fresh and dried herbs and spices. Salt-free seasonings. Onion and garlic powders. Low-sodium varieties of mustard and ketchup. Fresh or refrigerated horseradish. Lemon juice.  Fats and Oils Reduced-sodium salad dressings. Unsalted butter.  Other Unsalted popcorn and pretzels.  The items listed above may not be a complete list of recommended foods or beverages. Contact your dietitian for  more options. WHAT FOODS ARE NOT RECOMMENDED? Grains Instant hot cereals. Bread stuffing, pancake, and biscuit mixes. Croutons. Seasoned rice or pasta mixes. Noodle soup cups. Boxed or frozen macaroni and cheese. Self-rising flour. Regular salted crackers. Vegetables Regular canned vegetables. Regular canned tomato sauce and paste. Regular tomato and vegetable juices. Frozen vegetables in sauces. Salted [place] fries. Olives.  Rosita Fire. Relishes. Sauerkraut. Salsa. Meat and Other Protein Products Salted, canned, smoked, spiced, or pickled meats, seafood, or fish. Bacon, ham, sausage, hot dogs, corned beef, chipped beef, and packaged luncheon meats. Salt pork. Jerky. Pickled herring. Anchovies, regular canned tuna, and sardines. Salted nuts. Dairy Processed cheese and cheese spreads. Cheese curds. Blue cheese and cottage cheese. Buttermilk.  Condiments Onion and garlic salt, seasoned salt, table salt, and sea salt. Canned and packaged gravies. Worcestershire sauce. Tartar sauce. Barbecue sauce. Teriyaki sauce. Soy sauce, including reduced sodium. Steak sauce. Fish sauce. Oyster sauce. Cocktail sauce. Horseradish that you find on the shelf. Regular ketchup and mustard. Meat flavorings and tenderizers. Bouillon cubes. Hot sauce. Tabasco sauce. Marinades. Taco seasonings. Relishes. Fats and Oils Regular salad dressings. Salted butter. Margarine. Ghee. Bacon fat.  Other Potato and tortilla chips. Corn chips and puffs. Salted popcorn and pretzels. Canned or dried soups. Pizza. Frozen entrees and pot pies.  The items listed above may not be a complete list of foods and beverages to avoid. Contact your dietitian for more information.   This information is not intended to replace advice given to you by your health care provider. Make sure you discuss any questions you have with your health care provider.   Document Released: 02/18/2002 Document Revised: 09/19/2014 Document Reviewed: 07/03/2013 Elsevier Interactive Patient Education [employer]

## 2015-08-21 NOTE — Progress Notes (Signed)
[facility]. Discharge instructions and medication regimen reviewed at bedside with patient. Pt. verbalizes understanding of instructions and medication regimen. Patient assessment unchanged from this morning. TELE and IV discontinued per policy.

## 2015-08-21 NOTE — Care Management Important Message (Signed)
Important Message  Patient Details  Name: Aaron Ball MRN: [id number] Date of Birth: [birth year]   Medicare Important Message Given:  Yes    Olegario MessierKathy A Ameila Weldon 08/21/2015, 11:12 AM

## 2015-08-22 LAB — STOOL CULTURE: Special Requests: NORMAL

## 2015-10-08 ENCOUNTER — Observation Stay
Admission: EM | Admit: 2015-10-08 | Discharge: 2015-10-09 | Disposition: A | Discharge status: Home / Self Care | Payer: Medicare Other | Attending: Internal Medicine | Admitting: Internal Medicine

## 2015-10-08 ENCOUNTER — Emergency Department: Payer: Medicare Other

## 2015-10-08 ENCOUNTER — Encounter: Payer: Self-pay | Admitting: *Deleted

## 2015-10-08 DIAGNOSIS — I5032 Chronic diastolic (congestive) heart failure: Secondary | ICD-10-CM | POA: Insufficient documentation

## 2015-10-08 DIAGNOSIS — K219 Gastro-esophageal reflux disease without esophagitis: Secondary | ICD-10-CM | POA: Insufficient documentation

## 2015-10-08 DIAGNOSIS — E1122 Type 2 diabetes mellitus with diabetic chronic kidney disease: Secondary | ICD-10-CM | POA: Diagnosis not present

## 2015-10-08 DIAGNOSIS — Z79899 Other long term (current) drug therapy: Secondary | ICD-10-CM | POA: Insufficient documentation

## 2015-10-08 DIAGNOSIS — Z7982 Long term (current) use of aspirin: Secondary | ICD-10-CM | POA: Diagnosis not present

## 2015-10-08 DIAGNOSIS — I4891 Unspecified atrial fibrillation: Secondary | ICD-10-CM | POA: Diagnosis not present

## 2015-10-08 DIAGNOSIS — M79673 Pain in unspecified foot: Secondary | ICD-10-CM | POA: Insufficient documentation

## 2015-10-08 DIAGNOSIS — I2510 Atherosclerotic heart disease of native coronary artery without angina pectoris: Secondary | ICD-10-CM | POA: Insufficient documentation

## 2015-10-08 DIAGNOSIS — Z7984 Long term (current) use of oral hypoglycemic drugs: Secondary | ICD-10-CM | POA: Diagnosis not present

## 2015-10-08 DIAGNOSIS — R197 Diarrhea, unspecified: Secondary | ICD-10-CM | POA: Diagnosis not present

## 2015-10-08 DIAGNOSIS — R079 Chest pain, unspecified: Secondary | ICD-10-CM | POA: Diagnosis present

## 2015-10-08 DIAGNOSIS — N182 Chronic kidney disease, stage 2 (mild): Secondary | ICD-10-CM | POA: Diagnosis not present

## 2015-10-08 DIAGNOSIS — Z87891 Personal history of nicotine dependence: Secondary | ICD-10-CM | POA: Insufficient documentation

## 2015-10-08 DIAGNOSIS — I10 Essential (primary) hypertension: Secondary | ICD-10-CM | POA: Insufficient documentation

## 2015-10-08 DIAGNOSIS — N179 Acute kidney failure, unspecified: Secondary | ICD-10-CM | POA: Diagnosis not present

## 2015-10-08 DIAGNOSIS — R609 Edema, unspecified: Secondary | ICD-10-CM | POA: Diagnosis present

## 2015-10-08 DIAGNOSIS — M109 Gout, unspecified: Secondary | ICD-10-CM | POA: Diagnosis not present

## 2015-10-08 DIAGNOSIS — E876 Hypokalemia: Secondary | ICD-10-CM | POA: Insufficient documentation

## 2015-10-08 DIAGNOSIS — Z6841 Body Mass Index (BMI) 40.0 and over, adult: Secondary | ICD-10-CM | POA: Insufficient documentation

## 2015-10-08 DIAGNOSIS — R0600 Dyspnea, unspecified: Secondary | ICD-10-CM | POA: Diagnosis not present

## 2015-10-08 DIAGNOSIS — R0781 Pleurodynia: Principal | ICD-10-CM | POA: Insufficient documentation

## 2015-10-08 DIAGNOSIS — Z955 Presence of coronary angioplasty implant and graft: Secondary | ICD-10-CM | POA: Insufficient documentation

## 2015-10-08 DIAGNOSIS — R600 Localized edema: Secondary | ICD-10-CM | POA: Diagnosis not present

## 2015-10-08 DIAGNOSIS — E785 Hyperlipidemia, unspecified: Secondary | ICD-10-CM | POA: Diagnosis not present

## 2015-10-08 DIAGNOSIS — J449 Chronic obstructive pulmonary disease, unspecified: Secondary | ICD-10-CM | POA: Diagnosis not present

## 2015-10-08 DIAGNOSIS — I129 Hypertensive chronic kidney disease with stage 1 through stage 4 chronic kidney disease, or unspecified chronic kidney disease: Secondary | ICD-10-CM | POA: Diagnosis not present

## 2015-10-08 DIAGNOSIS — E662 Morbid (severe) obesity with alveolar hypoventilation: Secondary | ICD-10-CM | POA: Diagnosis not present

## 2015-10-08 DIAGNOSIS — E875 Hyperkalemia: Secondary | ICD-10-CM | POA: Diagnosis not present

## 2015-10-08 DIAGNOSIS — R0602 Shortness of breath: Secondary | ICD-10-CM | POA: Diagnosis not present

## 2015-10-08 HISTORY — DX: Type 2 diabetes mellitus without complications: E11.9

## 2015-10-08 HISTORY — DX: Obstructive sleep apnea (adult) (pediatric): G47.33

## 2015-10-08 HISTORY — DX: Unspecified atrial fibrillation: I48.91

## 2015-10-08 HISTORY — DX: Chronic diastolic (congestive) heart failure: I50.32

## 2015-10-08 HISTORY — DX: Morbid (severe) obesity due to excess calories: E66.01

## 2015-10-08 HISTORY — DX: Morbid (severe) obesity with alveolar hypoventilation: E66.2

## 2015-10-08 HISTORY — DX: Gastro-esophageal reflux disease without esophagitis: K21.9

## 2015-10-08 HISTORY — DX: Chronic obstructive pulmonary disease, unspecified: J44.9

## 2015-10-08 LAB — CBC
HEMATOCRIT: 32.8 % — AB (ref 40.0–52.0)
Hemoglobin: 10.9 g/dL — ABNORMAL LOW (ref 13.0–18.0)
MCH: 30.2 pg (ref 26.0–34.0)
MCHC: 33.3 g/dL (ref 32.0–36.0)
MCV: 90.4 fL (ref 80.0–100.0)
PLATELETS: 247 10*3/uL (ref 150–440)
RBC: 3.63 MIL/uL — ABNORMAL LOW (ref 4.40–5.90)
RDW: 15.6 % — AB (ref 11.5–14.5)
WBC: 7.2 10*3/uL (ref 3.8–10.6)

## 2015-10-08 LAB — BASIC METABOLIC PANEL
Anion gap: 13 (ref 5–15)
BUN: 35 mg/dL — AB (ref 6–20)
CHLORIDE: 99 mmol/L — AB (ref 101–111)
CO2: 23 mmol/L (ref 22–32)
CREATININE: 1.6 mg/dL — AB (ref 0.61–1.24)
Calcium: 8.7 mg/dL — ABNORMAL LOW (ref 8.9–10.3)
GFR calc Af Amer: 51 mL/min — ABNORMAL LOW (ref >60)
GFR calc non Af Amer: 44 mL/min — ABNORMAL LOW (ref >60)
GLUCOSE: 120 mg/dL — AB (ref 65–99)
POTASSIUM: 3.3 mmol/L — AB (ref 3.5–5.1)
Sodium: 135 mmol/L (ref 135–145)

## 2015-10-08 LAB — TROPONIN I: Troponin I: <0.03 ng/mL (ref <0.031)

## 2015-10-08 LAB — BRAIN NATRIURETIC PEPTIDE: B NATRIURETIC PEPTIDE 5: 37 pg/mL (ref 0.0–100.0)

## 2015-10-08 MED ORDER — COLCHICINE 0.6 MG PO TABS
1.2000 mg | ORAL_TABLET | Freq: Once | ORAL | Status: AC
  Administered 2015-10-09: 1.2 mg via ORAL
  Filled 2015-10-08: qty 2

## 2015-10-08 MED ORDER — SODIUM CHLORIDE 0.9 % IV BOLUS (SEPSIS)
1000.0000 mL | Freq: Once | INTRAVENOUS | Status: AC
  Administered 2015-10-08: 1000 mL via INTRAVENOUS

## 2015-10-08 MED ORDER — ONDANSETRON HCL 4 MG/2ML IJ SOLN
4.0000 mg | Freq: Once | INTRAMUSCULAR | Status: AC
  Administered 2015-10-08: 4 mg via INTRAVENOUS
  Filled 2015-10-08: qty 2

## 2015-10-08 MED ORDER — PREDNISONE 20 MG PO TABS
40.0000 mg | ORAL_TABLET | Freq: Every day | ORAL | Status: DC
Start: 2015-10-08 — End: 2015-10-09
  Administered 2015-10-09: 40 mg via ORAL
  Filled 2015-10-08 (×2): qty 2

## 2015-10-08 MED ORDER — ACETAMINOPHEN 650 MG RE SUPP: 650.0000 mg | Freq: Four times a day (QID) | RECTAL | Status: DC | PRN

## 2015-10-08 MED ORDER — ONDANSETRON HCL 4 MG PO TABS: 4.0000 mg | ORAL_TABLET | Freq: Four times a day (QID) | ORAL | Status: DC | PRN

## 2015-10-08 MED ORDER — HEPARIN SODIUM (PORCINE) 5000 UNIT/ML IJ SOLN
5000.0000 [IU] | Freq: Three times a day (TID) | INTRAMUSCULAR | Status: DC
  Administered 2015-10-09: 5000 [IU] via SUBCUTANEOUS
  Filled 2015-10-08: qty 1

## 2015-10-08 MED ORDER — ACETAMINOPHEN 325 MG PO TABS: 650.0000 mg | ORAL_TABLET | Freq: Four times a day (QID) | ORAL | Status: DC | PRN

## 2015-10-08 MED ORDER — ONDANSETRON HCL 4 MG/2ML IJ SOLN: 4.0000 mg | Freq: Four times a day (QID) | INTRAMUSCULAR | Status: DC | PRN

## 2015-10-08 MED ORDER — MORPHINE SULFATE (PF) 2 MG/ML IV SOLN: 2.0000 mg | INTRAVENOUS | Status: DC | PRN

## 2015-10-08 MED ORDER — SODIUM CHLORIDE 0.9% FLUSH
3.0000 mL | Freq: Two times a day (BID) | INTRAVENOUS | Status: DC
  Administered 2015-10-09 (×2): 3 mL via INTRAVENOUS

## 2015-10-08 MED ORDER — OXYCODONE HCL 5 MG PO TABS
5.0000 mg | ORAL_TABLET | ORAL | Status: DC | PRN
  Administered 2015-10-09 (×3): 5 mg via ORAL
  Filled 2015-10-08 (×3): qty 1

## 2015-10-08 NOTE — ED Notes (Addendum)
Pt states chest pain, worse with deep breathes and excertion, states SOB, also states watery stools and foot pain, states feels like his feet are on fire, states he took gout medication but the pain is still there

## 2015-10-08 NOTE — ED Provider Notes (Signed)
[facility] Emergency Department Provider Note  ____________________________________________  Time seen: 44  I have reviewed the triage vital signs and the nursing notes.  History by:  Asian  HISTORY  Chief Complaint Chest Pain; Foot Pain; and Shortness of Breath     HPI Aaron Ball is a 65 y.o. male who presents emergency Department complaining of shortness of breath and chest pain. This is been occurring last night and today. He reports his first concern to arise was pain in both feet. He has pain near his left great toe and the top of his right foot. He thought it might be gout, which he has had in the past. He took some cultures seen yesterday and one tablet today as well. He reports it did not help with the pain. He does report having some diarrhea. He does not seem to associate it with the colchicine. He has had nausea but no vomiting.  The patient has chronic swelling in his legs with his right leg larger than the left. He currently reports that the left leg has an acute change with increased swelling and it.  He reports having discomfort, pain, in his chest, pointing over the mid chest area. He denies radiation. There is no point tenderness or pain with movement. He reports the pain is worse when he takes a deep breath.  The patient denies any history of DVT or blood clots. He does have multiple stents in place and a history of congestive heart failure. He reports he frequently has shortness of breath, has had pulmonary function tests, but denies any history of COPD.   Past Medical History  Diagnosis Date  . Coronary artery disease     s/p stent in RCA.  He has six stents.  . Hypertension   . Arrhythmia 04/2011    s/p A-fib ablation.  . Hyperlipidemia   . Wears hearing aid     right ear  . Diabetes mellitus without complication [facility])     Patient Active Problem List   Diagnosis Date Noted  . ARF (acute renal failure) (HCC) 08/19/2015  .  Hyperkalemia 08/19/2015  . Diarrhea 08/19/2015  . CAD (coronary artery disease) 06/28/2011  . Edema 06/28/2011  . Obesity 06/28/2011  . Hyperlipidemia 06/28/2011  . Chest pain 06/28/2011  . HTN (hypertension) 06/28/2011    Past Surgical History  Procedure Laterality Date  . Atrial ablation surgery      A-Fib  . Cholecystectomy  2012  . Cardiac catheterization      s/p stent to RCA.   . Cardiac catheterization  2008    Dr. Juliann Pares @ [facility] .  [name] Hip fracture surgery    . Heart stint  2013    Current Outpatient Rx  Name  Route  Sig  Dispense  Refill  . aspirin 81 MG tablet   Oral   Take 81 mg by mouth daily. Take 162 mg daily.         [name] atorvastatin (LIPITOR) 40 MG tablet   Oral   Take 1 tablet by mouth daily.         . colchicine 0.6 MG tablet   Oral   Take 0.6 mg by mouth daily.         [name] gabapentin (NEURONTIN) 300 MG capsule   Oral   Take 300 mg by mouth at bedtime.         [name] lisinopril (PRINIVIL,ZESTRIL) 10 MG tablet   Oral   Take 10 mg by  mouth 2 (two) times daily.         [name] loratadine (CLARITIN) 10 MG tablet   Oral   Take 10 mg by mouth daily.         . metFORMIN (GLUCOPHAGE) 500 MG tablet   Oral   Take 1 tablet by mouth 2 (two) times daily.         . metoprolol succinate (TOPROL-XL) 25 MG 24 hr tablet   Oral   Take 1 tablet by mouth daily.         . nitroGLYCERIN (NITROSTAT) 0.4 MG SL tablet   Sublingual   Place 0.4 mg under the tongue every 5 (five) minutes as needed.          . torsemide (DEMADEX) 20 MG tablet   Oral   Take 1 tablet by mouth 3 (three) times daily.         . traZODone (DESYREL) 100 MG tablet   Oral   Take 1.5 tablets by mouth daily.           Allergies Erythromycin  Family History  Problem Relation Age of Onset  . Heart disease Father   . Heart attack Father     Social History Social History  Substance Use Topics  . Smoking status: Former Smoker -- 1.00 packs/day for 30 years    Types:  Cigarettes    Quit date: 06/28/2003  . Smokeless tobacco: None  . Alcohol Use: No    Review of Systems  Constitutional: Negative for fever/chills. ENT: Negative for congestion. Cardiovascular: The chest chest pain, worse with inspiration. Respiratory: Shortness of breath with pleuritic chest pain. Gastrointestinal: Negative for abdominal pain, vomiting and diarrhea. Genitourinary: Negative for dysuria. Musculoskeletal: Pain in both feet, see history of present illness. Also with swelling in both legs, with an acute change in the left leg. Skin: Negative for rash. Neurological: Negative for headache or focal weakness   10-point ROS otherwise negative.  ____________________________________________   PHYSICAL EXAM:  VITAL SIGNS: ED Triage Vitals  Enc Vitals Group     BP 10/08/15 1829 135/50 mmHg     Pulse Rate 10/08/15 1829 78     Resp 10/08/15 1829 20     Temp 10/08/15 1829 98.7 F (37.1 C)     Temp Source 10/08/15 1829 Oral     SpO2 10/08/15 1829 96 %     Weight 10/08/15 1829 348 lb (157.852 kg)     Height 10/08/15 1829  (1.676 m)     Head Cir --      Peak Flow --      Pain Score 10/08/15 1830 3     Pain Loc --      Pain Edu? --      Excl. in GC? --     Constitutional: Alert and oriented. Hard of hearing. Large body habitus. Minimal to mild increase work of breathing, but no acute distress. ENT   Head: Normocephalic and atraumatic.   Nose: No congestion/rhinnorhea.       Mouth: No erythema, no swelling   Cardiovascular: Normal rate, regular rhythm, no murmur noted Respiratory:  Minimal to mild increase work of breathing..    Breath sounds are clear and equal bilaterally.  Gastrointestinal: Large, soft, no distention. Nontender Back: No muscle spasm, no tenderness, no CVA tenderness. Musculoskeletal: No deformity noted. Notable swelling with the appearance of chronic changes in the right leg. Patient also swelling in the left leg, though the legs are  asymmetrical with the  left leg not as large as the right. There is a scar on the left forefoot. Neurologic:  Communicative. Normal appearing spontaneous movement in all 4 extremities. No gross focal neurologic deficits are appreciated.  Skin:  Skin is warm, dry. No rash noted. Psychiatric: Mood and affect are normal. Speech and behavior are normal.  ____________________________________________    LABS (pertinent positives/negatives)  Labs Reviewed  BASIC METABOLIC PANEL - Abnormal; Notable for the following:    Potassium 3.3 (*)    Chloride 99 (*)    Glucose, Bld 120 (*)    BUN 35 (*)    Creatinine, Ser 1.60 (*)    Calcium 8.7 (*)    GFR calc non Af Amer 44 (*)    GFR calc Af Amer 51 (*)    All other components within normal limits  CBC - Abnormal; Notable for the following:    RBC 3.63 (*)    Hemoglobin 10.9 (*)    HCT 32.8 (*)    RDW 15.6 (*)    All other components within normal limits  TROPONIN I  BRAIN NATRIURETIC PEPTIDE     EKG  ED ECG REPORT I, Caylan Schifano W, the attending physician, personally viewed and interpreted this ECG.   Date: 10/08/2015  EKG Time: 1830  Rate: 75  Rhythm:  Normal sinus rhythm  Axis: Normal  Intervals: Normal  ST&T Change: None noted   ____________________________________________    RADIOLOGY  Chest x-ray: FINDINGS: The lungs are clear. There is mild cardiomegaly. No pneumothorax or pleural effusion. No focal bony abnormality.  IMPRESSION: No acute disease.  ~~~~~~~~~~~~~~~~~~~  Bilateral Doppler of lower extremities: IMPRESSION: No evidence of deep venous thrombosis.  ____________________________________________   PROCEDURES ____________________________________________   INITIAL IMPRESSION / ASSESSMENT AND PLAN / ED COURSE  Pertinent labs & imaging results that were available during my care of the patient were reviewed by me and considered in my medical decision making (see chart for details).  65 year old  male with notable shortness of breath, complaint of chest pain, pleuritic in nature. He does have multiple stents this significant cardiac risk factors.  While he does not have a history of DVT or PE, he needs further evaluation to rule this out. He does have chronic renal insufficiency, at baseline today. His GFR is 44, which would allow for a CT angios of his chest, but we will begin with ultrasounds in order to attempt to avoid IV contrast currently.   Of note, his heart rate, blood pressure, and oxygen saturation level are all good.  ----------------------------------------- 11:04 PM on 10/08/2015 -----------------------------------------  Patient does not have evidence of a DVT by ultrasound.  Is unclear to me what has caused his shortness of breath and chest pain. We will admit him to the hospital for serial cardiac enzymes and consideration of further evaluation.   ____________________________________________   FINAL CLINICAL IMPRESSION(S) / ED DIAGNOSES  Final diagnoses:  Chest pain, unspecified chest pain type  Peripheral edema      Darien Ramus, MD 10/08/15 2316

## 2015-10-08 NOTE — H&P (Signed)
[facility] [place] [facility]   PATIENT NAME: Aaron Ball    MR#:  [id number]  DATE OF BIRTH:  1951-05-25   DATE OF ADMISSION:  10/08/2015  PRIMARY CARE PHYSICIAN: Pcp Not In System   REQUESTING/REFERRING PHYSICIAN: Carollee Massed  CHIEF COMPLAINT:   Chief Complaint  Patient presents with  . Chest Pain  . Foot Pain  . Shortness of Breath    HISTORY OF PRESENT ILLNESS:  Aaron Ball  is a 65 y.o. male with a known history of gout, coronary artery disease status post stenting who is presenting with chest pain. His describes one-day duration of chest pain retrosternal in location pleuritic in nature intensity 7/10, pleuritic, worse with movement, no relieving factors nonradiating. Associated shortness of breath, denies cough or fevers. Also describes 2 day duration of foot pain described as "painful" 8/10 worst movement no relieving factors nonradiating  PAST MEDICAL HISTORY:   Past Medical History  Diagnosis Date  . Coronary artery disease     s/p stent in RCA.  He has six stents.  . Hypertension   . Arrhythmia 04/2011    s/p A-fib ablation.  . Hyperlipidemia   . Wears hearing aid     right ear  . Diabetes mellitus without complication (HCC)     PAST SURGICAL HISTORY:   Past Surgical History  Procedure Laterality Date  . Atrial ablation surgery      A-Fib  . Cholecystectomy  2012  . Cardiac catheterization      s/p stent to RCA.   . Cardiac catheterization  2008    Dr. Juliann Pares @ [facility] .  [name] Hip fracture surgery    . Heart stint  2013    SOCIAL HISTORY:   Social History  Substance Use Topics  . Smoking status: Former Smoker -- 1.00 packs/day for 30 years    Types: Cigarettes    Quit date: 06/28/2003  . Smokeless tobacco: Not on file  . Alcohol Use: No    FAMILY HISTORY:   Family History  Problem Relation Age of Onset  . Heart disease Father   . Heart attack Father     DRUG ALLERGIES:   Allergies  Allergen Reactions   . Erythromycin Hives    REVIEW OF SYSTEMS:  REVIEW OF SYSTEMS:  CONSTITUTIONAL: Denies fevers, chills, fatigue, weakness.  EYES: Denies blurred vision, double vision, or eye pain.  EARS, NOSE, THROAT: Denies tinnitus, ear pain, hearing loss.  RESPIRATORY: denies cough, positive shortness of breath, denies wheezing  CARDIOVASCULAR: Positive chest pain, denies palpitations, edema.  GASTROINTESTINAL: Denies nausea, vomiting, diarrhea, abdominal pain.  GENITOURINARY: Denies dysuria, hematuria.  ENDOCRINE: Denies nocturia or thyroid problems. HEMATOLOGIC AND LYMPHATIC: Denies easy bruising or bleeding.  SKIN: Denies rash or lesions.  MUSCULOSKELETAL: Denies pain in neck, back, shoulder, knees, hips, positive foot pain as described above .  NEUROLOGIC: Denies paralysis, paresthesias.  PSYCHIATRIC: Denies anxiety or depressive symptoms. Otherwise full review of systems performed by me is negative.   MEDICATIONS AT HOME:   Prior to Admission medications   Medication Sig Start Date End Date Taking? Authorizing Provider  aspirin 81 MG tablet Take 81 mg by mouth daily. Take 162 mg daily.   Yes Historical Provider, MD  atorvastatin (LIPITOR) 40 MG tablet Take 1 tablet by mouth daily. 08/10/15  Yes Historical Provider, MD  colchicine 0.6 MG tablet Take 0.6 mg by mouth daily. 07/21/15  Yes Historical Provider, MD  gabapentin (NEURONTIN) 300 MG capsule Take 300 mg  by mouth at bedtime.   Yes Historical Provider, MD  lisinopril (PRINIVIL,ZESTRIL) 10 MG tablet Take 10 mg by mouth 2 (two) times daily.   Yes Historical Provider, MD  loratadine (CLARITIN) 10 MG tablet Take 10 mg by mouth daily.   Yes Historical Provider, MD  metFORMIN (GLUCOPHAGE) 500 MG tablet Take 1 tablet by mouth 2 (two) times daily. 08/03/15  Yes Historical Provider, MD  metoprolol succinate (TOPROL-XL) 25 MG 24 hr tablet Take 1 tablet by mouth daily. 07/26/15  Yes Historical Provider, MD  nitroGLYCERIN (NITROSTAT) 0.4 MG SL tablet  Place 0.4 mg under the tongue every 5 (five) minutes as needed.  07/08/15  Yes Historical Provider, MD  torsemide (DEMADEX) 20 MG tablet Take 1 tablet by mouth 3 (three) times daily. 08/07/15  Yes Historical Provider, MD  traZODone (DESYREL) 100 MG tablet Take 1.5 tablets by mouth daily. 08/19/15  Yes Historical Provider, MD      VITAL SIGNS:  Blood pressure 152/74, pulse 74, temperature 98.7 F (37.1 C), temperature source Oral, resp. rate 17, height  (1.676 m), weight 348 lb (157.852 kg), SpO2 99 %.  PHYSICAL EXAMINATION:  VITAL SIGNS: Filed Vitals:   10/08/15 2245 10/08/15 2300  BP: 156/71 152/74  Pulse: 71 74  Temp:    Resp: 16 17   GENERAL:64 y.o.male currently in no acute distress.  HEAD: Normocephalic, atraumatic.  EYES: Pupils equal, round, reactive to light. Extraocular muscles intact. No scleral icterus.  MOUTH: Moist mucosal membrane. Dentition intact. No abscess noted.  EAR, NOSE, THROAT: Clear without exudates. No external lesions.  NECK: Supple. No thyromegaly. No nodules. No JVD.  PULMONARY: Diminished breath sounds secondary to body habitus without wheeze rails or rhonci. No use of accessory muscles, Good respiratory effort. good air entry bilaterally CHEST: Nontender to palpation.  CARDIOVASCULAR: S1 and S2. Regular rate and rhythm. No murmurs, rubs, or gallops. 2+ edema. Pedal pulses 2+ bilaterally.  GASTROINTESTINAL: Obese Soft, nontender, nondistended. No masses. Positive bowel sounds. No hepatosplenomegaly.  MUSCULOSKELETAL: Bilateral second toe DP swelling with erythema otherwise No swelling, clubbing, or edema. Range of motion full in all extremities.  NEUROLOGIC: Cranial nerves II through XII are intact. No gross focal neurological deficits. Sensation intact. Reflexes intact.  SKIN: Erythema bilateral second toe DP No ulceration, lesions, rashes, or cyanosis. Skin warm and dry. Turgor intact.  PSYCHIATRIC: Mood, affect within normal limits. The patient is  awake, alert and oriented x 3. Insight, judgment intact.    LABORATORY PANEL:   CBC  Recent Labs Lab 10/08/15 1832  WBC 7.2  HGB 10.9*  HCT 32.8*  PLT 247   ------------------------------------------------------------------------------------------------------------------  Chemistries   Recent Labs Lab 10/08/15 1832  NA 135  K 3.3*  CL 99*  CO2 23  GLUCOSE 120*  BUN 35*  CREATININE 1.60*  CALCIUM 8.7*   ------------------------------------------------------------------------------------------------------------------  Cardiac Enzymes  Recent Labs Lab 10/08/15 1832  TROPONINI <0.03   ------------------------------------------------------------------------------------------------------------------  RADIOLOGY:  Dg Chest 2 View  10/08/2015  CLINICAL DATA:  Chest pain, shortness of breath and diarrhea beginning today. Initial encounter. EXAM: CHEST  2 VIEW COMPARISON:  PA and lateral chest 08/19/2015 and 02/16/2013. FINDINGS: The lungs are clear. There is mild cardiomegaly. No pneumothorax or pleural effusion. No focal bony abnormality. IMPRESSION: No acute disease. Electronically Signed   By: Drusilla Kanner M.D.   On: 10/08/2015 19:19   US Venous Img Lower Bilateral  10/08/2015  CLINICAL DATA:  Acute onset of left leg swelling and chronic right leg swelling.  Pleuritic chest pain. Initial encounter. EXAM: BILATERAL LOWER EXTREMITY VENOUS DOPPLER ULTRASOUND TECHNIQUE: Gray-scale sonography with graded compression, as well as color Doppler and duplex ultrasound were performed to evaluate the lower extremity deep venous systems from the level of the common femoral vein and including the common femoral, femoral, profunda femoral, popliteal and calf veins including the posterior tibial, peroneal and gastrocnemius veins when visible. The superficial great saphenous vein was also interrogated. Spectral Doppler was utilized to evaluate flow at rest and with distal augmentation  maneuvers in the common femoral, femoral and popliteal veins. COMPARISON:  None. FINDINGS: RIGHT LOWER EXTREMITY Common Femoral Vein: No evidence of thrombus. Normal compressibility, respiratory phasicity and response to augmentation. Saphenofemoral Junction: No evidence of thrombus. Normal compressibility and flow on color Doppler imaging. Profunda Femoral Vein: No evidence of thrombus. Normal compressibility and flow on color Doppler imaging. Femoral Vein: No evidence of thrombus. Normal compressibility, respiratory phasicity and response to augmentation. Popliteal Vein: No evidence of thrombus. Normal compressibility, respiratory phasicity and response to augmentation. Calf Veins: No evidence of thrombus. Normal compressibility and flow on color Doppler imaging. The right peroneal vein is not visualized on this study. Superficial Great Saphenous Vein: No evidence of thrombus. Normal compressibility and flow on color Doppler imaging. Venous Reflux:  None. Other Findings:  None. LEFT LOWER EXTREMITY Common Femoral Vein: No evidence of thrombus. Normal compressibility, respiratory phasicity and response to augmentation. Saphenofemoral Junction: No evidence of thrombus. Normal compressibility and flow on color Doppler imaging. Profunda Femoral Vein: No evidence of thrombus. Normal compressibility and flow on color Doppler imaging. Femoral Vein: No evidence of thrombus. Normal compressibility, respiratory phasicity and response to augmentation. Popliteal Vein: No evidence of thrombus. Normal compressibility, respiratory phasicity and response to augmentation. Calf Veins: No evidence of thrombus. Normal compressibility and flow on color Doppler imaging. Superficial Great Saphenous Vein: No evidence of thrombus. Normal compressibility and flow on color Doppler imaging. Venous Reflux:  None. Other Findings:  None. IMPRESSION: No evidence of deep venous thrombosis. Electronically Signed   By: Roanna Raider M.D.   On:  10/08/2015 22:47    EKG:   Orders placed or performed during the hospital encounter of 10/08/15  . ED EKG within 10 minutes  . ED EKG within 10 minutes  . EKG 12-Lead  . EKG 12-Lead    IMPRESSION AND PLAN:   65 year old Caucasian gentleman with significant cardiovascular history is unable chest pain  1. Chest pain, central: Initiate aspirin and statin therapy, admitted to telemetry, trend cardiac enzymes 3,  if continued elevation will initiate heparin drip ,nitroglycerin when necessary, morphine when necessary, given pleuritic nature concern for pulmonary embolism has minimal risk factors other than obesity we'll check a d-dimer elevated proceed to VQ scan 2. Acute gouty arthritis: Initiate colchicine as well as steroids would avoid NSAIDs given current kidney function 3. Type 2 diabetes non-insulin-requiring: Insulin sliding scale hold oral agents 4. Essential hypertension: Metoprolol 5. Venous thromboembolism prophylactic: Heparin subcutaneous      All the records are reviewed and case discussed with ED provider. Management plans discussed with the patient, family and they are in agreement.  CODE STATUS: Full  TOTAL TIME TAKING CARE OF THIS PATIENT: 40 minutes.    Hower,  Mardi Mainland.D on 10/08/2015 at 11:56 PM  Between 7am to 6pm - Pager - [phone]  After 6pm: House Pager: - [phone]  [facility]  Office  [phone]  CC: Primary care physician; Pcp Not In System

## 2015-10-08 NOTE — ED Notes (Addendum)
Pt returned from [place]. Pt c/o nausea. RN to inform ED MD.

## 2015-10-08 NOTE — ED Notes (Signed)
Consult MD at bedside.

## 2015-10-09 ENCOUNTER — Encounter: Payer: Medicare Other | Attending: Internal Medicine

## 2015-10-09 ENCOUNTER — Encounter: Payer: Self-pay | Admitting: Physician Assistant

## 2015-10-09 ENCOUNTER — Observation Stay (HOSPITAL_BASED_OUTPATIENT_CLINIC_OR_DEPARTMENT_OTHER)
Admit: 2015-10-09 | Discharge: 2015-10-09 | Disposition: A | Discharge status: Home / Self Care | Payer: Medicare Other | Attending: Internal Medicine | Admitting: Internal Medicine

## 2015-10-09 DIAGNOSIS — R0600 Dyspnea, unspecified: Secondary | ICD-10-CM | POA: Diagnosis not present

## 2015-10-09 DIAGNOSIS — R0781 Pleurodynia: Secondary | ICD-10-CM | POA: Diagnosis present

## 2015-10-09 DIAGNOSIS — R079 Chest pain, unspecified: Secondary | ICD-10-CM | POA: Diagnosis not present

## 2015-10-09 LAB — FIBRIN DERIVATIVES D-DIMER (ARMC ONLY): FIBRIN DERIVATIVES D-DIMER (ARMC): 1002 — AB (ref 0–499)

## 2015-10-09 LAB — HEMOGLOBIN A1C: Hgb A1c MFr Bld: 6.9 % — ABNORMAL HIGH (ref 4.0–6.0)

## 2015-10-09 LAB — GLUCOSE, CAPILLARY
GLUCOSE-CAPILLARY: 120 mg/dL — AB (ref 65–99)
GLUCOSE-CAPILLARY: 177 mg/dL — AB (ref 65–99)
GLUCOSE-CAPILLARY: 170 mg/dL — AB (ref 65–99)
Glucose-Capillary: 124 mg/dL — ABNORMAL HIGH (ref 65–99)

## 2015-10-09 LAB — TROPONIN I: Troponin I: <0.03 ng/mL (ref <0.031)

## 2015-10-09 MED ORDER — LISINOPRIL 10 MG PO TABS
10.0000 mg | ORAL_TABLET | Freq: Two times a day (BID) | ORAL | Status: DC
  Administered 2015-10-09 (×2): 10 mg via ORAL
  Filled 2015-10-09 (×2): qty 1

## 2015-10-09 MED ORDER — ASPIRIN 81 MG PO CHEW
81.0000 mg | CHEWABLE_TABLET | Freq: Every day | ORAL | Status: DC
  Administered 2015-10-09: 81 mg via ORAL
  Filled 2015-10-09: qty 1

## 2015-10-09 MED ORDER — GABAPENTIN 300 MG PO CAPS
300.0000 mg | ORAL_CAPSULE | Freq: Every day | ORAL | Status: DC
  Administered 2015-10-09: 300 mg via ORAL
  Filled 2015-10-09: qty 1

## 2015-10-09 MED ORDER — INSULIN ASPART 100 UNIT/ML ~~LOC~~ SOLN: 0.0000 [IU] | Freq: Every day | SUBCUTANEOUS | Status: DC

## 2015-10-09 MED ORDER — LORATADINE 10 MG PO TABS
10.0000 mg | ORAL_TABLET | Freq: Every day | ORAL | Status: DC
Start: 2015-10-09 — End: 2015-10-09
  Administered 2015-10-09: 10 mg via ORAL
  Filled 2015-10-09: qty 1

## 2015-10-09 MED ORDER — INSULIN ASPART 100 UNIT/ML ~~LOC~~ SOLN
0.0000 [IU] | Freq: Three times a day (TID) | SUBCUTANEOUS | Status: DC
  Administered 2015-10-09 (×2): 2 [IU] via SUBCUTANEOUS
  Filled 2015-10-09 (×2): qty 2

## 2015-10-09 MED ORDER — TRAZODONE HCL 50 MG PO TABS
150.0000 mg | ORAL_TABLET | Freq: Every day | ORAL | Status: DC
  Administered 2015-10-09: 150 mg via ORAL
  Filled 2015-10-09: qty 1

## 2015-10-09 MED ORDER — COLCHICINE 0.6 MG PO TABS
0.6000 mg | ORAL_TABLET | Freq: Every day | ORAL | Status: DC
  Administered 2015-10-09: 0.6 mg via ORAL
  Filled 2015-10-09: qty 1

## 2015-10-09 MED ORDER — METOPROLOL SUCCINATE ER 25 MG PO TB24
25.0000 mg | ORAL_TABLET | Freq: Every day | ORAL | Status: DC
  Administered 2015-10-09: 25 mg via ORAL
  Filled 2015-10-09: qty 1

## 2015-10-09 MED ORDER — ATORVASTATIN CALCIUM 20 MG PO TABS
40.0000 mg | ORAL_TABLET | Freq: Every day | ORAL | Status: DC
  Administered 2015-10-09: 40 mg via ORAL
  Filled 2015-10-09: qty 2

## 2015-10-09 MED ORDER — PREDNISONE 20 MG PO TABS: 40.0000 mg | ORAL_TABLET | Freq: Every day | ORAL | Status: DC

## 2015-10-09 MED ORDER — NITROGLYCERIN 0.4 MG SL SUBL
0.4000 mg | SUBLINGUAL_TABLET | SUBLINGUAL | Status: DC | PRN
Start: 2015-10-09 — End: 2015-10-09

## 2015-10-09 MED ORDER — ENOXAPARIN SODIUM 150 MG/ML ~~LOC~~ SOLN
1.0000 mg/kg | Freq: Two times a day (BID) | SUBCUTANEOUS | Status: DC
  Administered 2015-10-09 (×2): 160 mg via SUBCUTANEOUS
  Filled 2015-10-09 (×3): qty 1.05

## 2015-10-09 MED ORDER — IPRATROPIUM-ALBUTEROL 0.5-2.5 (3) MG/3ML IN SOLN: 3.0000 mL | RESPIRATORY_TRACT | Status: DC | PRN

## 2015-10-09 MED ORDER — TECHNETIUM TC 99M DIETHYLENETRIAME-PENTAACETIC ACID
32.9500 | Freq: Once | INTRAVENOUS | Status: AC | PRN
  Administered 2015-10-09: 32.95 via INTRAVENOUS

## 2015-10-09 MED ORDER — TECHNETIUM TO 99M ALBUMIN AGGREGATED
3.9900 | Freq: Once | INTRAVENOUS | Status: AC | PRN
  Administered 2015-10-09: 3.99 via INTRAVENOUS

## 2015-10-09 NOTE — Discharge Instructions (Signed)
Chest Wall Pain °Chest wall pain is pain in or around the bones and muscles of your chest. Sometimes, an injury causes this pain. Sometimes, the cause may not be known. This pain may take several weeks or longer to get better. °HOME CARE °Pay attention to any changes in your symptoms. Take these actions to help with your pain: °· Rest as told by your doctor. °· Avoid activities that cause pain. Try not to use your chest, belly (abdominal), or side muscles to lift heavy things. °· If directed, apply ice to the painful area: °¨ Put ice in a plastic bag. °¨ Place a towel between your skin and the bag. °¨ Leave the ice on for 20 minutes, 2-3 times per day. °· Take over-the-counter and prescription medicines only as told by your doctor. °· Do not use tobacco products, including cigarettes, chewing tobacco, and e-cigarettes. If you need help quitting, ask your doctor. °· Keep all follow-up visits as told by your doctor. This is important. °GET HELP IF: °· You have a fever. °· Your chest pain gets worse. °· You have new symptoms. °GET HELP RIGHT AWAY IF: °· You feel sick to your stomach (nauseous) or you throw up (vomit). °· You feel sweaty or light-headed. °· You have a cough with phlegm (sputum) or you cough up blood. °· You are short of breath. °  °This information is not intended to replace advice given to you by your health care provider. Make sure you discuss any questions you have with your health care provider. °  °Document Released: 02/15/2008 Document Revised: 05/20/2015 Document Reviewed: 11/24/2014 °Elsevier Interactive Patient Education ©2016 [employer] ° °

## 2015-10-09 NOTE — Progress Notes (Signed)
Pt alert and oriented x4, no complaints of pain or discomfort.  Bed in low position, call bell within reach.  Bed alarms on and functioning.  Assessment done and charted.  Will continue to monitor and do hourly rounding throughout the shift 

## 2015-10-09 NOTE — Care Management Obs Status (Signed)
MEDICARE OBSERVATION STATUS NOTIFICATION   Patient Details  Name: Elier Zellars MRN: [id number] Date of Birth: 09/19/1950   Medicare Observation Status Notification Given:  Yes    Marily Memos, RN 10/09/2015, 9:14 AM

## 2015-10-09 NOTE — Consult Note (Signed)
Cardiology Consultation Note  Patient ID: Aaron Ball, MRN: [id number], DOB/AGE: 06/11/1951 65 y.o. Admit date: 10/08/2015   Date of Consult: 10/09/2015 Primary Physician: Pcp Not In System Primary Cardiologist: [facility]  Chief Complaint: Chest pain Reason for Consult: Chest pain  HPI: 65 y.o. male with h/o coronary artery disease status post PCI to the proximal RCA in 1997 complicated by in-stent restenosis in 2003 and 2014 (s/p DES to mid RCA on 05/27/13), atrial fibrillation status post ablation 2 without evidence of recurrence, chronic diastolic CHF, DM2, morbid obesity, COPD, OSA/OHS on CPAP, GERD, and hyperparathyroidism who presents to [facility] with atypical chest pain.   He is followed by [facility]. Last cardiac cath 02/2014 showed nonobstructive CAD. At his last outpatinet follow up in 07/2015 he was not needing SL NTG. He had an echo at that time that was not functionally a diagnostic study 2/2 chest wall interference. He was scheduled for a nuclear stress test around the first of the year but was a no show. He has had issues with LE edema around November and had self increased his torsemide coupled with his potassium. This lead to some hyperkalemia. He has had issues with sodium and fluid intake as well.   He was sitting on the toilet a couple of days ago having a BM, straining when he developed a short episode of left-sided chest pain. This pain resolved on it's own after several minutes. The pain returned at rest the next day, again lasting several minutes. It again returned on 1/26 at rest prompting him to come in to the ED. Pain worse with deep inspiration. Never with any associated symptoms. Pain never last longer than 20 minutes. LE swelling has been stable. No increased orthopnea, though he does use 2x4 underneath his headboard.   Upon the patient's arrival to [facility] they were found to have troponin negative x 3, d dimer 1002, BNP 37, K+ 3.3, SCr 1.60. ECG showed NSR, 75 bpm, nonspecific  inferolateral st changes, CXR showed no acute prcess. He is currently without symptoms.   Past Medical History  Diagnosis Date  . Coronary artery disease     a. s/p stent in RCA in 1997 w/ ISR 2003 & 05/2013 s/p DES to mid RCA 05/2013; b. cardiac cath 02/2014: nonobstructive CAD with patent stent in RCA with 40% ISR, no evi of pulm HTN, nl LVEDP, EF >65%   . Hypertension   . A-fib (HCC) 04/2011    a. s/p ablation x 2 w/o evi of recurrence   . Hyperlipidemia   . Wears hearing aid     right ear  . Diabetes mellitus (HCC)   . Morbid obesity (HCC)   . Chronic diastolic CHF (congestive heart failure) (HCC)     a. echo 2012: EF >55%, nl RVSP  . COPD (chronic obstructive pulmonary disease) (HCC)   . OSA (obstructive sleep apnea)   . Obesity hypoventilation syndrome (HCC)   . GERD (gastroesophageal reflux disease)       Most Recent Cardiac Studies: Left and right heart cath 02/2014:  FINAL CARDIAC CATHETERIZATION REPORT (Right and Left Heart) Conclusions: - No obvious flow-limiting coronary stenoses with patent stent in the  right coronary artery with 40% in-stent stenosis - No evidence of pulmonary hypertension - Normal left ventricular end-diastolic pressure - Hyperdynamic left ventricular systolic function with estimated ejection  fraction greater than 65%  Plan: - Optimize medical therapy   Surgical History:  Past Surgical History  Procedure Laterality Date  . Atrial  ablation surgery      A-Fib  . Cholecystectomy  2012  . Cardiac catheterization      s/p stent to RCA.   . Cardiac catheterization  2008    Dr. Juliann Pares @ [facility] .  [name] Hip fracture surgery    . Heart stint  2013     Home Meds: Prior to Admission medications   Medication Sig Start Date End Date Taking? Authorizing Provider  aspirin 81 MG tablet Take 81 mg by mouth daily. Take 162 mg daily.   Yes Historical Provider, MD  atorvastatin (LIPITOR) 40 MG tablet Take 1 tablet by mouth daily. 08/10/15  Yes Historical  Provider, MD  colchicine 0.6 MG tablet Take 0.6 mg by mouth daily. 07/21/15  Yes Historical Provider, MD  gabapentin (NEURONTIN) 300 MG capsule Take 300 mg by mouth at bedtime.   Yes Historical Provider, MD  lisinopril (PRINIVIL,ZESTRIL) 10 MG tablet Take 10 mg by mouth 2 (two) times daily.   Yes Historical Provider, MD  loratadine (CLARITIN) 10 MG tablet Take 10 mg by mouth daily.   Yes Historical Provider, MD  metFORMIN (GLUCOPHAGE) 500 MG tablet Take 1 tablet by mouth 2 (two) times daily. 08/03/15  Yes Historical Provider, MD  metoprolol succinate (TOPROL-XL) 25 MG 24 hr tablet Take 1 tablet by mouth daily. 07/26/15  Yes Historical Provider, MD  nitroGLYCERIN (NITROSTAT) 0.4 MG SL tablet Place 0.4 mg under the tongue every 5 (five) minutes as needed.  07/08/15  Yes Historical Provider, MD  torsemide (DEMADEX) 20 MG tablet Take 1 tablet by mouth 3 (three) times daily. 08/07/15  Yes Historical Provider, MD  traZODone (DESYREL) 100 MG tablet Take 1.5 tablets by mouth daily. 08/19/15  Yes Historical Provider, MD    Inpatient Medications:  . aspirin  81 mg Oral Daily  . atorvastatin  40 mg Oral Daily  . colchicine  0.6 mg Oral Daily  . enoxaparin (LOVENOX) injection  1 mg/kg Subcutaneous Q12H  . gabapentin  300 mg Oral QHS  . insulin aspart  0-5 Units Subcutaneous QHS  . insulin aspart  0-9 Units Subcutaneous TID WC  . lisinopril  10 mg Oral BID  . loratadine  10 mg Oral Daily  . metoprolol succinate  25 mg Oral Daily  . predniSONE  40 mg Oral Q breakfast  . sodium chloride flush  3 mL Intravenous Q12H  . traZODone  150 mg Oral Daily      Allergies:  Allergies  Allergen Reactions  . Erythromycin Hives    Social History   Social History  . Marital Status: Divorced    Spouse Name: N/A  . Number of Children: N/A  . Years of Education: N/A   Occupational History  . Not on file.   Social History Main Topics  . Smoking status: Former Smoker -- 1.00 packs/day for 30 years     Types: Cigarettes    Quit date: 06/28/2003  . Smokeless tobacco: Not on file  . Alcohol Use: No  . Drug Use: No  . Sexual Activity: Not on file   Other Topics Concern  . Not on file   Social History Narrative     Family History  Problem Relation Age of Onset  . Heart disease Father   . Heart attack Father      Review of Systems: Review of Systems  Constitutional: Positive for malaise/fatigue. Negative for fever, chills, weight loss and diaphoresis.  HENT: Negative for congestion.   Eyes: Negative for discharge and redness.  Respiratory: Positive for shortness of breath. Negative for cough, hemoptysis, sputum production and wheezing.   Cardiovascular: Positive for chest pain and leg swelling. Negative for palpitations, orthopnea, claudication and PND.  Gastrointestinal: Negative for heartburn, nausea, vomiting and abdominal pain.  Musculoskeletal: Negative for falls.  Skin: Negative for rash.  Neurological: Positive for weakness. Negative for dizziness, tingling, tremors, sensory change, speech change, focal weakness and loss of consciousness.  Endo/Heme/Allergies: Does not bruise/bleed easily.  Psychiatric/Behavioral: Negative for suicidal ideas and substance abuse. The patient is not nervous/anxious.   All other systems reviewed and are negative.    Labs:  Recent Labs  10/08/15 1832 10/09/15 0157 10/09/15 0716  TROPONINI <0.03 <0.03 <0.03   Lab Results  Component Value Date   WBC 7.2 10/08/2015   HGB 10.9* 10/08/2015   HCT 32.8* 10/08/2015   MCV 90.4 10/08/2015   PLT 247 10/08/2015     Recent Labs Lab 10/08/15 1832  NA 135  K 3.3*  CL 99*  CO2 23  BUN 35*  CREATININE 1.60*  CALCIUM 8.7*  GLUCOSE 120*   Lab Results  Component Value Date   CHOL 144 02/18/2013   HDL 33* 02/18/2013   LDLCALC 70 02/18/2013   TRIG 206* 02/18/2013   No results found for: DDIMER  Radiology/Studies:  Dg Chest 2 View  10/08/2015  CLINICAL DATA:  Chest pain, shortness  of breath and diarrhea beginning today. Initial encounter. EXAM: CHEST  2 VIEW COMPARISON:  PA and lateral chest 08/19/2015 and 02/16/2013. FINDINGS: The lungs are clear. There is mild cardiomegaly. No pneumothorax or pleural effusion. No focal bony abnormality. IMPRESSION: No acute disease. Electronically Signed   By: Drusilla Kanner M.D.   On: 10/08/2015 19:19   US Venous Img Lower Bilateral  10/08/2015  CLINICAL DATA:  Acute onset of left leg swelling and chronic right leg swelling. Pleuritic chest pain. Initial encounter. EXAM: BILATERAL LOWER EXTREMITY VENOUS DOPPLER ULTRASOUND TECHNIQUE: Gray-scale sonography with graded compression, as well as color Doppler and duplex ultrasound were performed to evaluate the lower extremity deep venous systems from the level of the common femoral vein and including the common femoral, femoral, profunda femoral, popliteal and calf veins including the posterior tibial, peroneal and gastrocnemius veins when visible. The superficial great saphenous vein was also interrogated. Spectral Doppler was utilized to evaluate flow at rest and with distal augmentation maneuvers in the common femoral, femoral and popliteal veins. COMPARISON:  None. FINDINGS: RIGHT LOWER EXTREMITY Common Femoral Vein: No evidence of thrombus. Normal compressibility, respiratory phasicity and response to augmentation. Saphenofemoral Junction: No evidence of thrombus. Normal compressibility and flow on color Doppler imaging. Profunda Femoral Vein: No evidence of thrombus. Normal compressibility and flow on color Doppler imaging. Femoral Vein: No evidence of thrombus. Normal compressibility, respiratory phasicity and response to augmentation. Popliteal Vein: No evidence of thrombus. Normal compressibility, respiratory phasicity and response to augmentation. Calf Veins: No evidence of thrombus. Normal compressibility and flow on color Doppler imaging. The right peroneal vein is not visualized on this study.  Superficial Great Saphenous Vein: No evidence of thrombus. Normal compressibility and flow on color Doppler imaging. Venous Reflux:  None. Other Findings:  None. LEFT LOWER EXTREMITY Common Femoral Vein: No evidence of thrombus. Normal compressibility, respiratory phasicity and response to augmentation. Saphenofemoral Junction: No evidence of thrombus. Normal compressibility and flow on color Doppler imaging. Profunda Femoral Vein: No evidence of thrombus. Normal compressibility and flow on color Doppler imaging. Femoral Vein: No evidence of thrombus. Normal compressibility, respiratory phasicity  and response to augmentation. Popliteal Vein: No evidence of thrombus. Normal compressibility, respiratory phasicity and response to augmentation. Calf Veins: No evidence of thrombus. Normal compressibility and flow on color Doppler imaging. Superficial Great Saphenous Vein: No evidence of thrombus. Normal compressibility and flow on color Doppler imaging. Venous Reflux:  None. Other Findings:  None. IMPRESSION: No evidence of deep venous thrombosis. Electronically Signed   By: Roanna Raider M.D.   On: 10/08/2015 22:47    EKG: NSR, 75 bpm, nonspecific inferolateral st changes  Weights: Filed Weights   10/08/15 1829 10/09/15 0119  Weight: 348 lb (157.852 kg) 345 lb 4.8 oz (156.627 kg)     Physical Exam: Blood pressure 116/56, pulse 72, temperature 97.4 F (36.3 C), temperature source Oral, resp. rate 20, height  (1.676 m), weight 345 lb 4.8 oz (156.627 kg), SpO2 94 %. Body mass index is 55.76 kg/(m^2). General: Well developed, well nourished, in no acute distress. Head: Normocephalic, atraumatic, sclera non-icteric, no xanthomas, nares are without discharge.  Neck: Negative for carotid bruits. JVD not elevated. Lungs: Clear bilaterally to auscultation without wheezes, rales, or rhonchi. Breathing is unlabored. Heart: RRR with S1 S2. No murmurs, rubs, or gallops appreciated. Abdomen: Obese, soft,  non-tender, non-distended with normoactive bowel sounds. No hepatomegaly. No rebound/guarding. No obvious abdominal masses. Msk:  Strength and tone appear normal for age. Extremities: No clubbing or cyanosis. Chronic woody appearance with 1+ pretibial edema to the mid shin bilaterally.   Neuro: Alert and oriented X 3. No facial asymmetry. No focal deficit. Moves all extremities spontaneously. Psych:  Responds to questions appropriately with a normal affect.    Assessment and Plan:   1. Atypical chest pain: -Negative troponin x 3 -Elevated D-dimer at 1002, he is for VQ scan later today to evaluate for PE -Alternative etiologies include his hypokalemia, MSK, or pleuritic  -Check echo to evaluate LV function, wall motion, RV cavity size, and right-sided pressure -If unremarkable studies he can follow up with his primary cardiologist at [facility]  2. CAD s/p PCI as above: -Recent cardiac cath as above with obstructive disease in 02/2014 -Defer outpatient work up to primary cardiologist  -Continue aspirin 81 mg, Toprol 25 mg, Lipitor 40 mg  -Hold lisinopril as below  3. Chronic diastolic CHF: -He has chronic LE edema  -Recommend compression stockings and elevation  -Previously on torsemide 40 mg bid 2/2 self titration. This appears to have dried him out too much -Given his acute on chronic kidney injury will need to hold torsemide as below -Continue Toprol XL as above -Limit sodium and fluids  4. Acute on CKD stage II: -SCr in November was up at 1.44, currently SCr is up further at 1.60   -Would hold further nephrotoxic drugs including torsemide and lisinopril  -Close outpatient follow up with PCP, Renal  5. Afib s/p ablation x 2: -No recurrence at this time -Toprol as above  6. COPD: -No active flare  7. Morbid obesity/OSA/OHS: -Per PCP -Weight loss advised given all of the above    SignedEula Listen, PA-C Pager: [phone] 10/09/2015, 10:13 AM

## 2015-10-09 NOTE — Progress Notes (Signed)
Gone down to nuclear med for VQ Scan

## 2015-10-09 NOTE — Discharge Summary (Signed)
[facility] Physicians -  at [facility]   PATIENT NAME: Aaron Ball    MR#:  [id number]  DATE OF BIRTH:  June 20, 1951  DATE OF ADMISSION:  10/08/2015 ADMITTING PHYSICIAN: Wyatt Haste, MD  DATE OF DISCHARGE: 10/09/2015  5:49 PM  PRIMARY CARE PHYSICIAN: Vanetta Shawl, MD   ADMISSION DIAGNOSIS:  Pleuritic chest pain [R07.81] Peripheral edema [R60.9] Chest pain, unspecified chest pain type [R07.9]  DISCHARGE DIAGNOSIS:  Principal Problem:   Chest pain, central  SECONDARY DIAGNOSIS:   Past Medical History  Diagnosis Date  . Coronary artery disease     a. s/p stent in RCA in 1997 w/ ISR 2003 & 05/2013 s/p DES to mid RCA 05/2013; b. cardiac cath 02/2014: nonobstructive CAD with patent stent in RCA with 40% ISR, no evi of pulm HTN, nl LVEDP, EF >65%   . Hypertension   . A-fib (HCC) 04/2011    a. s/p ablation x 2 w/o evi of recurrence   . Hyperlipidemia   . Wears hearing aid     right ear  . Diabetes mellitus (HCC)   . Morbid obesity (HCC)   . Chronic diastolic CHF (congestive heart failure) (HCC)     a. echo 2012: EF >55%, nl RVSP  . COPD (chronic obstructive pulmonary disease) (HCC)   . OSA (obstructive sleep apnea)   . Obesity hypoventilation syndrome (HCC)   . GERD (gastroesophageal reflux disease)     HOSPITAL COURSE:  65 y.o. male with a known history of gout, coronary artery disease status post stenting who is admitted for chest pain. He was ruled out with neg serial troponins. VQ scan was low probability.  Cardio c/s was obtained who didn't feel this is cardiac considering atypical chest pain. Patient had mild renal insufficiency for which he was recommended to stop lisinopril, torsemide and metformin until f/up by his PCP.  He was agreeable with D/C plans and is being DCed home in stable condition. DISCHARGE CONDITIONS:   Stable.  CONSULTS OBTAINED:  Treatment Team:  Wyatt Haste, MD Iran Ouch, MD  DRUG ALLERGIES:    Allergies  Allergen Reactions  . Erythromycin Hives    DISCHARGE MEDICATIONS:   Discharge Medication List as of 10/09/2015  4:55 PM    CONTINUE these medications which have NOT CHANGED   Details  aspirin 81 MG tablet Take 81 mg by mouth daily. Take 162 mg daily., Until Discontinued, Historical Med    atorvastatin (LIPITOR) 40 MG tablet Take 1 tablet by mouth daily., Starting 08/10/2015, Until Discontinued, Historical Med    colchicine 0.6 MG tablet Take 0.6 mg by mouth daily., Starting 07/21/2015, Until Discontinued, Historical Med    gabapentin (NEURONTIN) 300 MG capsule Take 300 mg by mouth at bedtime., Until Discontinued, Historical Med    loratadine (CLARITIN) 10 MG tablet Take 10 mg by mouth daily., Until Discontinued, Historical Med    metoprolol succinate (TOPROL-XL) 25 MG 24 hr tablet Take 1 tablet by mouth daily., Starting 07/26/2015, Until Discontinued, Historical Med    nitroGLYCERIN (NITROSTAT) 0.4 MG SL tablet Place 0.4 mg under the tongue every 5 (five) minutes as needed. , Starting 07/08/2015, Until Discontinued, Historical Med    traZODone (DESYREL) 100 MG tablet Take 1.5 tablets by mouth daily., Starting 08/19/2015, Until Discontinued, Historical Med      STOP taking these medications     lisinopril (PRINIVIL,ZESTRIL) 10 MG tablet      metFORMIN (GLUCOPHAGE) 500 MG tablet      torsemide (  DEMADEX) 20 MG tablet        DISCHARGE INSTRUCTIONS:    DIET:  Cardiac diet  DISCHARGE CONDITION:  Good  ACTIVITY:  Activity as tolerated  OXYGEN:  Home Oxygen: No.   Oxygen Delivery: room air  DISCHARGE LOCATION:  home   If you experience worsening of your admission symptoms, develop shortness of breath, life threatening emergency, suicidal or homicidal thoughts you must seek medical attention immediately by calling 911 or calling your MD immediately  if symptoms less severe.  You Must read complete instructions/literature along with all the possible  adverse reactions/side effects for all the Medicines you take and that have been prescribed to you. Take any new Medicines after you have completely understood and accpet all the possible adverse reactions/side effects.   Please note  You were cared for by a hospitalist during your hospital stay. If you have any questions about your discharge medications or the care you received while you were in the hospital after you are discharged, you can call the unit and asked to speak with the hospitalist on call if the hospitalist that took care of you is not available. Once you are discharged, your primary care physician will handle any further medical issues. Please note that NO REFILLS for any discharge medications will be authorized once you are discharged, as it is imperative that you return to your primary care physician (or establish a relationship with a primary care physician if you do not have one) for your aftercare needs so that they can reassess your need for medications and monitor your lab values.    On the day of Discharge:  VITAL SIGNS:  Blood pressure 130/52, pulse 73, temperature 97.6 F (36.4 C), temperature source Oral, resp. rate 18, height  (1.676 m), weight 156.627 kg (345 lb 4.8 oz), SpO2 98 %. PHYSICAL EXAMINATION:  GENERAL:  65 y.o.-year-old patient lying in the bed with no acute distress.  EYES: Pupils equal, round, reactive to light and accommodation. No scleral icterus. Extraocular muscles intact.  HEENT: Head atraumatic, normocephalic. Oropharynx and nasopharynx clear.  NECK:  Supple, no jugular venous distention. No thyroid enlargement, no tenderness.  LUNGS: Normal breath sounds bilaterally, no wheezing, rales,rhonchi or crepitation. No use of accessory muscles of respiration.  CARDIOVASCULAR: S1, S2 normal. No murmurs, rubs, or gallops.  ABDOMEN: Soft, non-tender, non-distended. Bowel sounds present. No organomegaly or mass.  EXTREMITIES: No pedal edema, cyanosis, or  clubbing.  NEUROLOGIC: Cranial nerves II through XII are intact. Muscle strength 5/5 in all extremities. Sensation intact. Gait not checked.  PSYCHIATRIC: The patient is alert and oriented x 3.  SKIN: No obvious rash, lesion, or ulcer.  DATA REVIEW:   CBC  Recent Labs Lab 10/08/15 1832  WBC 7.2  HGB 10.9*  HCT 32.8*  PLT 247    Chemistries   Recent Labs Lab 10/08/15 1832  NA 135  K 3.3*  CL 99*  CO2 23  GLUCOSE 120*  BUN 35*  CREATININE 1.60*  CALCIUM 8.7*    Cardiac Enzymes  Recent Labs Lab 10/09/15 1319  TROPONINI <0.03    Microbiology Results  Results for orders placed or performed during the hospital encounter of 08/19/15  Stool culture     Status: None   Collection Time: 08/19/15  8:30 PM  Result Value Ref Range Status   Specimen Description STOOL  Final   Special Requests Normal  Final   Culture   Final    NO SALMONELLA OR SHIGELLA ISOLATED No  Pathogenic E. coli detected NO CAMPYLOBACTER DETECTED    Report Status 08/22/2015 FINAL  Final  C difficile quick scan w PCR reflex     Status: None   Collection Time: 08/19/15  8:30 PM  Result Value Ref Range Status   C Diff antigen NEGATIVE NEGATIVE Final   C Diff toxin NEGATIVE NEGATIVE Final   C Diff interpretation Negative for C. difficile  Final    RADIOLOGY:  Dg Chest 2 View  10/08/2015  CLINICAL DATA:  Chest pain, shortness of breath and diarrhea beginning today. Initial encounter. EXAM: CHEST  2 VIEW COMPARISON:  PA and lateral chest 08/19/2015 and 02/16/2013. FINDINGS: The lungs are clear. There is mild cardiomegaly. No pneumothorax or pleural effusion. No focal bony abnormality. IMPRESSION: No acute disease. Electronically Signed   By: Drusilla Kanner M.D.   On: 10/08/2015 19:19   Nm Pulmonary Perf And Vent  10/09/2015  CLINICAL DATA:  Pleuritic chest pain. History of heart disease with cardiac stents. EXAM: NUCLEAR MEDICINE VENTILATION - PERFUSION LUNG SCAN TECHNIQUE: Ventilation images were  obtained in multiple projections using inhaled aerosol Tc-39m DTPA. Perfusion images were obtained in multiple projections after intravenous injection of Tc-11m MAA. RADIOPHARMACEUTICALS:  32.95 Technetium-46m DTPA aerosol inhalation and 3.99 Technetium-71m MAA IV COMPARISON:  Chest radiograph 10/08/2015 FINDINGS: Ventilation: No focal ventilation defect. Perfusion: No wedge shaped peripheral perfusion defects to suggest acute pulmonary embolism. IMPRESSION: Low probability (less than 20%) of pulmonary embolus. Electronically Signed   By: Ted Mcalpine M.D.   On: 10/09/2015 12:36   US Venous Img Lower Bilateral  10/08/2015  CLINICAL DATA:  Acute onset of left leg swelling and chronic right leg swelling. Pleuritic chest pain. Initial encounter. EXAM: BILATERAL LOWER EXTREMITY VENOUS DOPPLER ULTRASOUND TECHNIQUE: Gray-scale sonography with graded compression, as well as color Doppler and duplex ultrasound were performed to evaluate the lower extremity deep venous systems from the level of the common femoral vein and including the common femoral, femoral, profunda femoral, popliteal and calf veins including the posterior tibial, peroneal and gastrocnemius veins when visible. The superficial great saphenous vein was also interrogated. Spectral Doppler was utilized to evaluate flow at rest and with distal augmentation maneuvers in the common femoral, femoral and popliteal veins. COMPARISON:  None. FINDINGS: RIGHT LOWER EXTREMITY Common Femoral Vein: No evidence of thrombus. Normal compressibility, respiratory phasicity and response to augmentation. Saphenofemoral Junction: No evidence of thrombus. Normal compressibility and flow on color Doppler imaging. Profunda Femoral Vein: No evidence of thrombus. Normal compressibility and flow on color Doppler imaging. Femoral Vein: No evidence of thrombus. Normal compressibility, respiratory phasicity and response to augmentation. Popliteal Vein: No evidence of thrombus.  Normal compressibility, respiratory phasicity and response to augmentation. Calf Veins: No evidence of thrombus. Normal compressibility and flow on color Doppler imaging. The right peroneal vein is not visualized on this study. Superficial Great Saphenous Vein: No evidence of thrombus. Normal compressibility and flow on color Doppler imaging. Venous Reflux:  None. Other Findings:  None. LEFT LOWER EXTREMITY Common Femoral Vein: No evidence of thrombus. Normal compressibility, respiratory phasicity and response to augmentation. Saphenofemoral Junction: No evidence of thrombus. Normal compressibility and flow on color Doppler imaging. Profunda Femoral Vein: No evidence of thrombus. Normal compressibility and flow on color Doppler imaging. Femoral Vein: No evidence of thrombus. Normal compressibility, respiratory phasicity and response to augmentation. Popliteal Vein: No evidence of thrombus. Normal compressibility, respiratory phasicity and response to augmentation. Calf Veins: No evidence of thrombus. Normal compressibility and flow on color Doppler imaging.  Superficial Great Saphenous Vein: No evidence of thrombus. Normal compressibility and flow on color Doppler imaging. Venous Reflux:  None. Other Findings:  None. IMPRESSION: No evidence of deep venous thrombosis. Electronically Signed   By: Roanna Raider M.D.   On: 10/08/2015 22:47     Management plans discussed with the patient, family and they are in agreement.  CODE STATUS:     Code Status Orders        Start     Ordered   10/08/15 2317  Full code   Continuous     10/08/15 2316    Code Status History    Date Active Date Inactive Code Status Order ID Comments User Context   08/19/2015  5:07 PM 08/21/2015  2:48 PM Full Code [id number]  Altamese Dilling, MD Inpatient    Advance Directive Documentation        Most Recent Value   Type of Advance Directive  Living will   Pre-existing out of facility DNR order (yellow form or pink MOST  form)     "MOST" Form in Place?        TOTAL TIME TAKING CARE OF THIS PATIENT: 55 minutes.    [facility], Jahmia Berrett M.D on 10/09/2015 at 6:23 PM  Between 7am to 6pm - Pager - [phone]  After 6pm go to [email or web address] - password [email or web address] [facility]  [place] [facility]  Office  [phone]  CC: Primary care physician; Vanetta Shawl, MD Lorine Bears MD, [facility]  Note: This dictation was prepared with Dragon dictation along with smaller phrase technology. Any transcriptional errors that result from this process are unintentional.

## 2015-10-09 NOTE — Progress Notes (Signed)
*  PRELIMINARY RESULTS* Echocardiogram 2D Echocardiogram has been performed.  Georgann Housekeeper Hege 10/09/2015, 3:12 PM

## 2015-10-09 NOTE — Care Management Note (Signed)
Case Management Note  Patient Details  Name: Aaron Ball MRN: [id number] Date of Birth: 1951/07/27  Subjective/Objective:  RNCm assessment for discharge planning. Met with patient at bedside. He lives alone, drives and is independent. He has a cane and walker he uses when he has a gout flare up. Patient is on disability and makes approximately $1200 per month.  Although he has a deductible he is able to pay for his medications. No needs identified at this time. Patient does not meet home bound criteria.  Case closed.                  Action/Plan: Case closed.  Expected Discharge Date:                  Expected Discharge Plan:  Home/Self Care  In-House Referral:     Discharge planning Services  CM Consult  Post Acute Care Choice:    Choice offered to:     DME Arranged:    DME Agency:     HH Arranged:    [place] Agency:     Status of Service:  Completed, signed off  Medicare Important Message Given:    Date Medicare IM Given:    Medicare IM give by:    Date Additional Medicare IM Given:    Additional Medicare Important Message give by:     If discussed at [place] of Stay Meetings, dates discussed:    Additional Comments:  Jolly Mango, RN 10/09/2015, 9:40 AM

## 2015-10-09 NOTE — Progress Notes (Signed)
Pt is having a echo at this time

## 2015-10-09 NOTE — Progress Notes (Signed)
Elevated d-dimer as expected, we'll dose therapeutic Lovenox, order VQ scan can discontinue Lovenox if PE negative

## 2016-02-01 DIAGNOSIS — M65942 Unspecified synovitis and tenosynovitis, left hand: Secondary | ICD-10-CM | POA: Insufficient documentation

## 2016-02-01 DIAGNOSIS — M659 Synovitis and tenosynovitis, unspecified: Secondary | ICD-10-CM | POA: Insufficient documentation

## 2016-03-06 ENCOUNTER — Emergency Department
Admission: EM | Admit: 2016-03-06 | Discharge: 2016-03-06 | Disposition: A | Discharge status: Home / Self Care | Payer: Medicare Other | Attending: Emergency Medicine | Admitting: Emergency Medicine

## 2016-03-06 ENCOUNTER — Emergency Department: Payer: Medicare Other

## 2016-03-06 ENCOUNTER — Encounter: Payer: Self-pay | Admitting: Medical Oncology

## 2016-03-06 DIAGNOSIS — J449 Chronic obstructive pulmonary disease, unspecified: Secondary | ICD-10-CM | POA: Insufficient documentation

## 2016-03-06 DIAGNOSIS — E785 Hyperlipidemia, unspecified: Secondary | ICD-10-CM | POA: Diagnosis not present

## 2016-03-06 DIAGNOSIS — I5032 Chronic diastolic (congestive) heart failure: Secondary | ICD-10-CM | POA: Insufficient documentation

## 2016-03-06 DIAGNOSIS — Z79899 Other long term (current) drug therapy: Secondary | ICD-10-CM | POA: Diagnosis not present

## 2016-03-06 DIAGNOSIS — I110 Hypertensive heart disease with heart failure: Secondary | ICD-10-CM | POA: Diagnosis not present

## 2016-03-06 DIAGNOSIS — Z87891 Personal history of nicotine dependence: Secondary | ICD-10-CM | POA: Insufficient documentation

## 2016-03-06 DIAGNOSIS — I2510 Atherosclerotic heart disease of native coronary artery without angina pectoris: Secondary | ICD-10-CM | POA: Insufficient documentation

## 2016-03-06 DIAGNOSIS — E119 Type 2 diabetes mellitus without complications: Secondary | ICD-10-CM | POA: Diagnosis not present

## 2016-03-06 DIAGNOSIS — I4891 Unspecified atrial fibrillation: Secondary | ICD-10-CM | POA: Insufficient documentation

## 2016-03-06 DIAGNOSIS — M25551 Pain in right hip: Secondary | ICD-10-CM | POA: Diagnosis present

## 2016-03-06 DIAGNOSIS — M1611 Unilateral primary osteoarthritis, right hip: Secondary | ICD-10-CM | POA: Insufficient documentation

## 2016-03-06 DIAGNOSIS — Z7982 Long term (current) use of aspirin: Secondary | ICD-10-CM | POA: Insufficient documentation

## 2016-03-06 MED ORDER — KETOROLAC TROMETHAMINE 60 MG/2ML IM SOLN
INTRAMUSCULAR | Status: AC
  Administered 2016-03-06: 60 mg via INTRAMUSCULAR
  Filled 2016-03-06: qty 2

## 2016-03-06 MED ORDER — OXYCODONE-ACETAMINOPHEN 5-325 MG PO TABS: 1.0000 | ORAL_TABLET | ORAL | Status: DC | PRN

## 2016-03-06 MED ORDER — KETOROLAC TROMETHAMINE 60 MG/2ML IM SOLN
60.0000 mg | Freq: Once | INTRAMUSCULAR | Status: AC
  Administered 2016-03-06: 60 mg via INTRAMUSCULAR

## 2016-03-06 NOTE — ED Notes (Signed)
Hip pain x1 week no injury. Pt sitting in w/c.

## 2016-03-06 NOTE — ED Notes (Signed)
Pt ambulatory to desk with reports of rt hip pain x 1 week without injury. Hx of same that he takes meloxicam for.

## 2016-03-06 NOTE — ED Provider Notes (Signed)
[facility] Emergency Department Provider Note  ____________________________________________  Time seen: Approximately 1:19 PM  I have reviewed the triage vital signs and the nursing notes.   HISTORY  Chief Complaint Hip Pain    HPI Aaron Ball is a 65 y.o. male who presents for evaluation of right hip pain 1 week without injury. Patient has a past medical history the same concerned about increased arthritis in his hip. Has had a hip replacement with a rod in place. States pain has been worse over the last couple days. Describes pain as 7/10 nonradiating. No relief with meloxicam.   Past Medical History  Diagnosis Date  . Coronary artery disease     a. s/p stent in RCA in 1997 w/ ISR 2003 & 05/2013 s/p DES to mid RCA 05/2013; b. cardiac cath 02/2014: nonobstructive CAD with patent stent in RCA with 40% ISR, no evi of pulm HTN, nl LVEDP, EF >65%   . Hypertension   . A-fib (HCC) 04/2011    a. s/p ablation x 2 w/o evi of recurrence   . Hyperlipidemia   . Wears hearing aid     right ear  . Diabetes mellitus (HCC)   . Morbid obesity (HCC)   . Chronic diastolic CHF (congestive heart failure) (HCC)     a. echo 2012: EF >55%, nl RVSP  . COPD (chronic obstructive pulmonary disease) (HCC)   . OSA (obstructive sleep apnea)   . Obesity hypoventilation syndrome (HCC)   . GERD (gastroesophageal reflux disease)     Patient Active Problem List   Diagnosis Date Noted  . Chest pain, central 10/08/2015  . CAD (coronary artery disease) 06/28/2011  . Obesity 06/28/2011  . Hyperlipidemia 06/28/2011  . HTN (hypertension) 06/28/2011    Past Surgical History  Procedure Laterality Date  . Atrial ablation surgery      A-Fib  . Cholecystectomy  2012  . Cardiac catheterization      s/p stent to RCA.   . Cardiac catheterization  2008    Dr. Juliann Paresallwood [facility] .  [name]. Hip fracture surgery    . Heart stint  2013    Current Outpatient Rx  Name  Route  Sig  Dispense   Refill  . aspirin 81 MG tablet   Oral   Take 81 mg by mouth daily. Take 162 mg daily.         [name]. atorvastatin (LIPITOR) 40 MG tablet   Oral   Take 1 tablet by mouth daily.         . colchicine 0.6 MG tablet   Oral   Take 0.6 mg by mouth daily.         [name]. gabapentin (NEURONTIN) 300 MG capsule   Oral   Take 300 mg by mouth at bedtime.         [name]. loratadine (CLARITIN) 10 MG tablet   Oral   Take 10 mg by mouth daily.         . metoprolol succinate (TOPROL-XL) 25 MG 24 hr tablet   Oral   Take 1 tablet by mouth daily.         . nitroGLYCERIN (NITROSTAT) 0.4 MG SL tablet   Sublingual   Place 0.4 mg under the tongue every 5 (five) minutes as needed.          [name]. oxyCODONE-acetaminophen (ROXICET) 5-325 MG tablet   Oral   Take 1-2 tablets by mouth every 4 (four) hours as needed for severe pain.  15 tablet   0   . traZODone (DESYREL) 100 MG tablet   Oral   Take 1.5 tablets by mouth daily.           Allergies Erythromycin  Family History  Problem Relation Age of Onset  . Heart disease Father   . Heart attack Father     Social History Social History  Substance Use Topics  . Smoking status: Former Smoker -- 1.00 packs/day for 30 years    Types: Cigarettes    Quit date: 06/28/2003  . Smokeless tobacco: None  . Alcohol Use: No    Review of Systems Constitutional: No fever/chills Cardiovascular: Denies chest pain. Respiratory: Denies shortness of breath. Gastrointestinal: No abdominal pain.  No nausea, no vomiting.  No diarrhea.  No constipation. Genitourinary: Negative for dysuria. Musculoskeletal: Positive for right hip pain. Skin: Negative for rash. Neurological: Negative for headaches, focal weakness or numbness.  10-point ROS otherwise negative.  ____________________________________________   PHYSICAL EXAM:  VITAL SIGNS: ED Triage Vitals  Enc Vitals Group     BP 03/06/16 1259 140/75 mmHg     Pulse Rate 03/06/16 1259 64     Resp  03/06/16 1259 18     Temp 03/06/16 1259 97.8 F (36.6 C)     Temp Source 03/06/16 1259 Oral     SpO2 03/06/16 1259 99 %     Weight 03/06/16 1259 342 lb (155.13 kg)     Height 03/06/16 1259 5\' 6"  (1.676 m)     Head Cir --      Peak Flow --      Pain Score 03/06/16 1259 7     Pain Loc --      Pain Edu? --      Excl. in GC? --     Constitutional: Alert and oriented. Well appearing and in no acute distress. Cardiovascular: Normal rate, regular rhythm. Grossly normal heart sounds.  Good peripheral circulation. Respiratory: Normal respiratory effort.  No retractions. Lungs CTAB. Gastrointestinal: Soft and nontender. No distention. No abdominal bruits. No CVA tenderness. Musculoskeletal: Patient sitting in wheelchair unable to move complaining of right hip pain. Straight leg raise increased pain with that hip. Distally neurovascularly intact. Range of motion increased pain with extension. Skin:  Skin is warm, dry and intact. No rash noted. Psychiatric: Mood and affect are normal. Speech and behavior are normal.  ____________________________________________   LABS (all labs ordered are listed, but only abnormal results are displayed)  Labs Reviewed - No data to display ____________________________________________  EKG   ____________________________________________  RADIOLOGY  Hardware intact and stable. Chronic osteoarthritis noted.  ____________________________________________   PROCEDURES  Procedure(s) performed: None  Critical Care performed: No  ____________________________________________   INITIAL IMPRESSION / ASSESSMENT AND PLAN / ED COURSE  Pertinent labs & imaging results that were available during my care of the patient were reviewed by me and considered in my medical decision making (see chart for details).  Osteoarthritis of the right hip. Recurrent hip pain. Patient discharged home with Percocet and to follow-up with his orthopedic doctor for continuity  pain control. He voices no other emergency medical complaints this ____________________________________________   FINAL CLINICAL IMPRESSION(S) / ED DIAGNOSES  Final diagnoses:  Osteoarthritis of right hip, unspecified osteoarthritis type     This chart was dictated using voice recognition software/Dragon. Despite best efforts to proofread, errors can occur which can change the meaning. Any change was purely unintentional.   Evangeline Dakinharles M Weston Fulco, PA-C 03/06/16 1427  Jeanmarie PlantJames A McShane, MD  03/06/16 1612 

## 2016-03-06 NOTE — ED Notes (Signed)
Pt verbalized understanding of discharge instructions. NAD at this time. 

## 2016-03-06 NOTE — Discharge Instructions (Signed)
Heat Therapy °Heat therapy can help ease sore, stiff, injured, and tight muscles and joints. Heat relaxes your muscles, which may help ease your pain.  °RISKS AND COMPLICATIONS °If you have any of the following conditions, do not use heat therapy unless your health care provider has approved: °· Poor circulation. °· Healing wounds or scarred skin in the area being treated. °· Diabetes, heart disease, or high blood pressure. °· Not being able to feel (numbness) the area being treated. °· Unusual swelling of the area being treated. °· Active infections. °· Blood clots. °· Cancer. °· Inability to communicate pain. This may include young children and people who have problems with their brain function (dementia). °· Pregnancy. °Heat therapy should only be used on old, pre-existing, or long-lasting (chronic) injuries. Do not use heat therapy on new injuries unless directed by your health care provider. °HOW TO USE HEAT THERAPY °There are several different kinds of heat therapy, including: °· Moist heat pack. °· Warm water bath. °· Hot water bottle. °· Electric heating pad. °· Heated gel pack. °· Heated wrap. °· Electric heating pad. °Use the heat therapy method suggested by your health care provider. Follow your health care provider's instructions on when and how to use heat therapy. °GENERAL HEAT THERAPY RECOMMENDATIONS °· Do not sleep while using heat therapy. Only use heat therapy while you are awake. °· Your skin may turn pink while using heat therapy. Do not use heat therapy if your skin turns red. °· Do not use heat therapy if you have new pain. °· High heat or long exposure to heat can cause burns. Be careful when using heat therapy to avoid burning your skin. °· Do not use heat therapy on areas of your skin that are already irritated, such as with a rash or sunburn. °SEEK MEDICAL CARE IF: °· You have blisters, redness, swelling, or numbness. °· You have new pain. °· Your pain is worse. °MAKE SURE  YOU: °· Understand these instructions. °· Will watch your condition. °· Will get help right away if you are not doing well or get worse. °  °This information is not intended to replace advice given to you by your health care provider. Make sure you discuss any questions you have with your health care provider. °  °Document Released: 11/21/2011 Document Revised: 09/19/2014 Document Reviewed: 10/22/2013 °Elsevier Interactive Patient Education ©2016 [employer] ° °Osteoarthritis °Osteoarthritis is a disease that causes soreness and inflammation of a joint. It occurs when the cartilage at the affected joint wears down. Cartilage acts as a cushion, covering the ends of bones where they meet to form a joint. Osteoarthritis is the most common form of arthritis. It often occurs in older people. The joints affected most often by this condition include those in the: °· Ends of the fingers. °· Thumbs. °· Neck. °· Lower back. °· Knees. °· Hips. °CAUSES  °Over time, the cartilage that covers the ends of bones begins to wear away. This causes bone to rub on bone, producing pain and stiffness in the affected joints.  °RISK FACTORS °Certain factors can increase your chances of having osteoarthritis, including: °· Older age. °· Excessive body weight. °· Overuse of joints. °· Previous joint injury. °SIGNS AND SYMPTOMS  °· Pain, swelling, and stiffness in the joint. °· Over time, the joint may lose its normal shape. °· Small deposits of bone (osteophytes) may grow on the edges of the joint. °· Bits of bone or cartilage can break off and float inside the   joint space. This may cause more pain and damage. °DIAGNOSIS  °Your health care provider will do a physical exam and ask about your symptoms. Various tests may be ordered, such as: °· X-rays of the affected joint. °· Blood tests to rule out other types of arthritis. °Additional tests may be used to diagnose your condition. °TREATMENT  °Goals of treatment are to control pain and  improve joint function. Treatment plans may include: °· A prescribed exercise program that allows for rest and joint relief. °· A weight control plan. °· Pain relief techniques, such as: °¨ Properly applied heat and cold. °¨ Electric pulses delivered to nerve endings under the skin (transcutaneous electrical nerve stimulation [TENS]). °¨ Massage. °¨ Certain nutritional supplements. °· Medicines to control pain, such as: °¨ Acetaminophen. °¨ Nonsteroidal anti-inflammatory drugs (NSAIDs), such as naproxen. °¨ Narcotic or central-acting agents, such as tramadol. °¨ Corticosteroids. These can be given orally or as an injection. °· Surgery to reposition the bones and relieve pain (osteotomy) or to remove loose pieces of bone and cartilage. Joint replacement may be needed in advanced states of osteoarthritis. °HOME CARE INSTRUCTIONS  °· Take medicines only as directed by your health care provider. °· Maintain a healthy weight. Follow your health care provider's instructions for weight control. This may include dietary instructions. °· Exercise as directed. Your health care provider can recommend specific types of exercise. These may include: °¨ Strengthening exercises. These are done to strengthen the muscles that support joints affected by arthritis. They can be performed with weights or with exercise bands to add resistance. °¨ Aerobic activities. These are exercises, such as brisk walking or low-impact aerobics, that get your heart pumping. °¨ Range-of-motion activities. These keep your joints limber. °¨ Balance and agility exercises. These help you maintain daily living skills. °· Rest your affected joints as directed by your health care provider. °· Keep all follow-up visits as directed by your health care provider. °SEEK MEDICAL CARE IF:  °· Your skin turns red. °· You develop a rash in addition to your joint pain. °· You have worsening joint pain. °· You have a fever along with joint or muscle aches. °SEEK  IMMEDIATE MEDICAL CARE IF: °· You have a significant loss of weight or appetite. °· You have night sweats. °FOR MORE INFORMATION  °· National Institute of Arthritis and Musculoskeletal and Skin Diseases: www.niams.nih.gov °· National Institute on Aging: www.nia.nih.gov °· American College of Rheumatology: www.rheumatology.org °  °This information is not intended to replace advice given to you by your health care provider. Make sure you discuss any questions you have with your health care provider. °  °Document Released: 08/29/2005 Document Revised: 09/19/2014 Document Reviewed: 05/06/2013 °Elsevier Interactive Patient Education ©2016 [employer]. ° °

## 2016-05-02 DIAGNOSIS — M1A9XX0 Chronic gout, unspecified, without tophus (tophi): Secondary | ICD-10-CM | POA: Insufficient documentation

## 2016-05-03 IMAGING — US US EXTREM LOW VENOUS BILAT
1 series · 13 of 24 positions shown · non-contrast
Comparison: None.

CLINICAL DATA: Acute onset of left leg swelling and chronic right
leg swelling. Pleuritic chest pain. Initial encounter.



[Series 1: us extrem low venous bilat · 0.07mm/px · 13 of 43 slices shown]
[im 1/43]
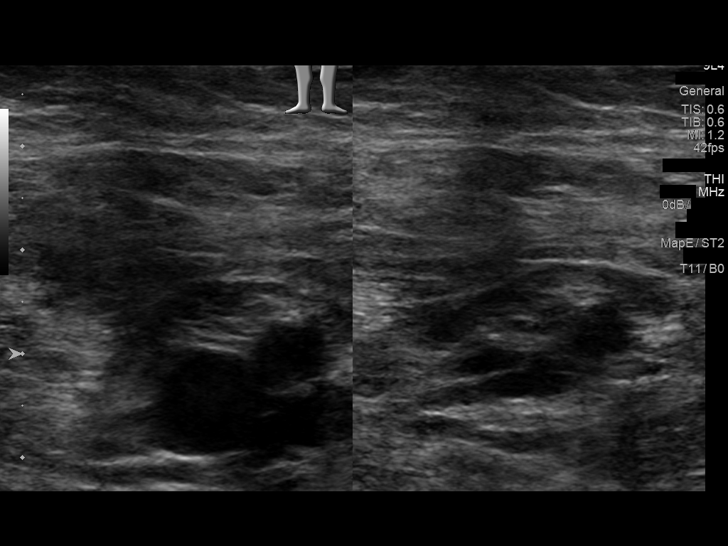
[im 4/43]
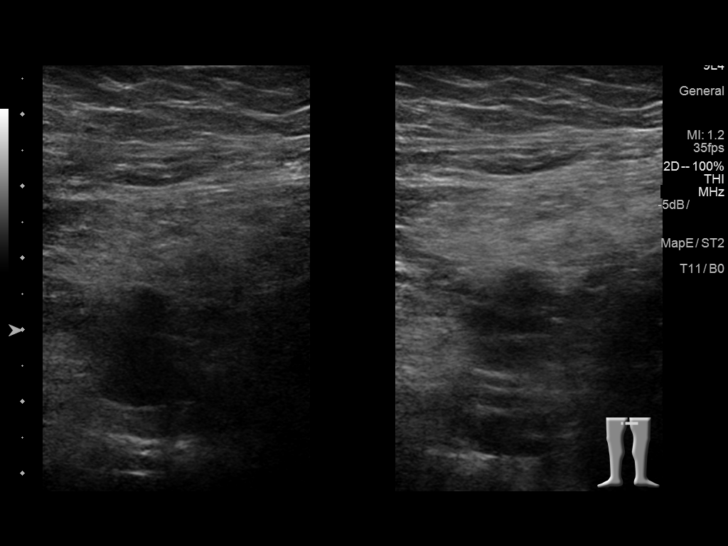
[im 8/43]
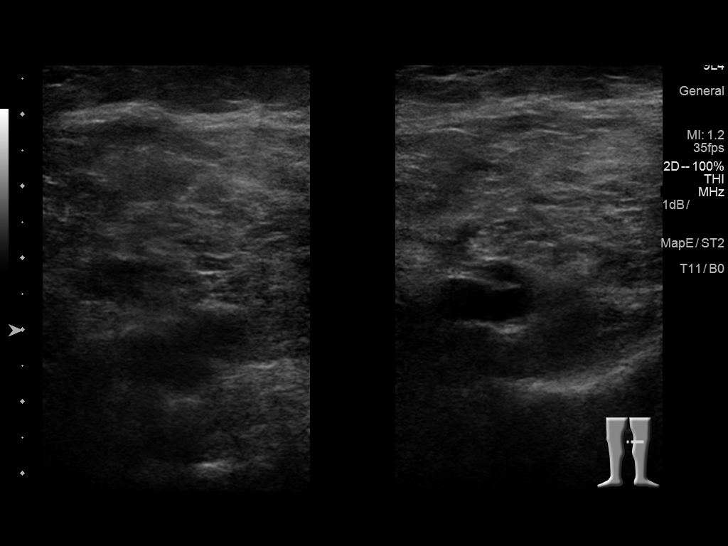
[im 11/43]
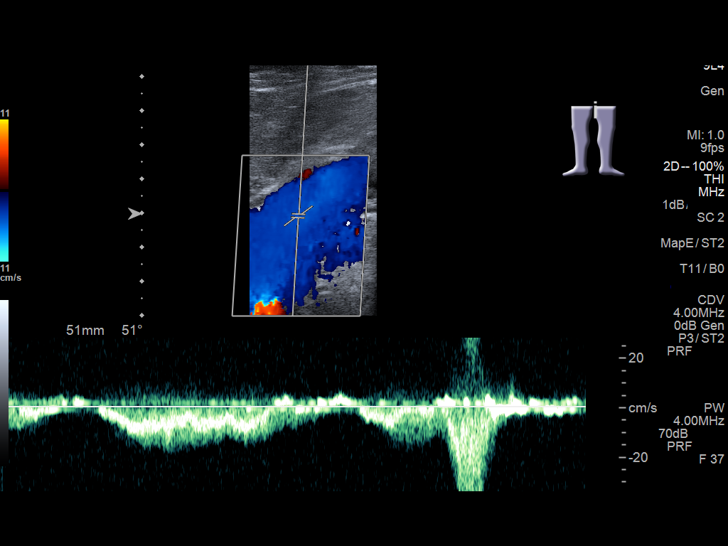
[im 15/43]
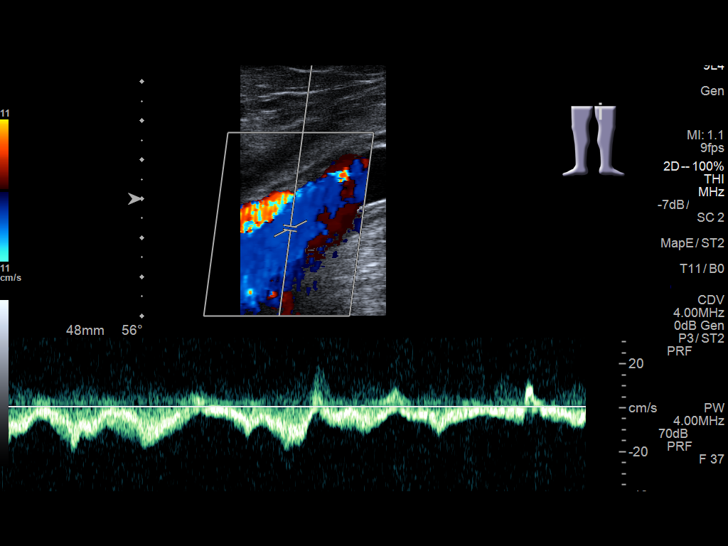
[im 19/43]
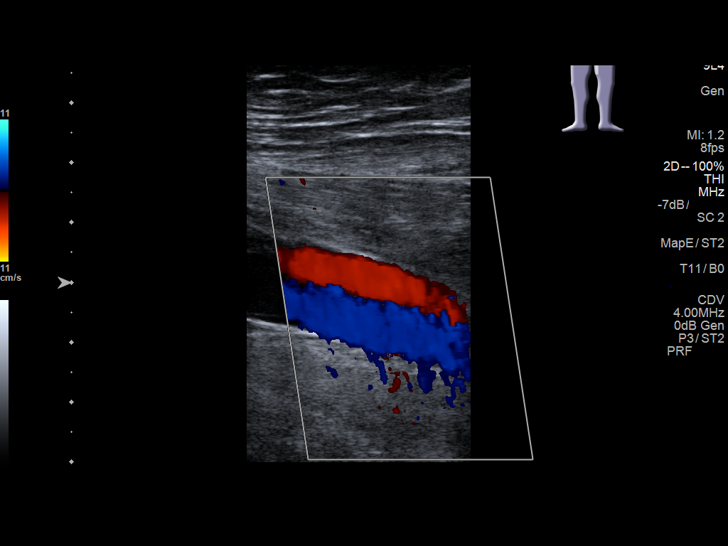
[im 22/43]
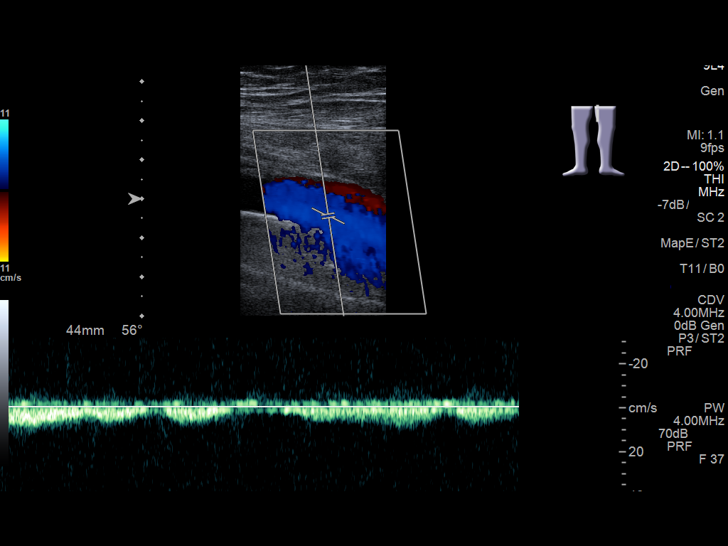
[im 24/43]
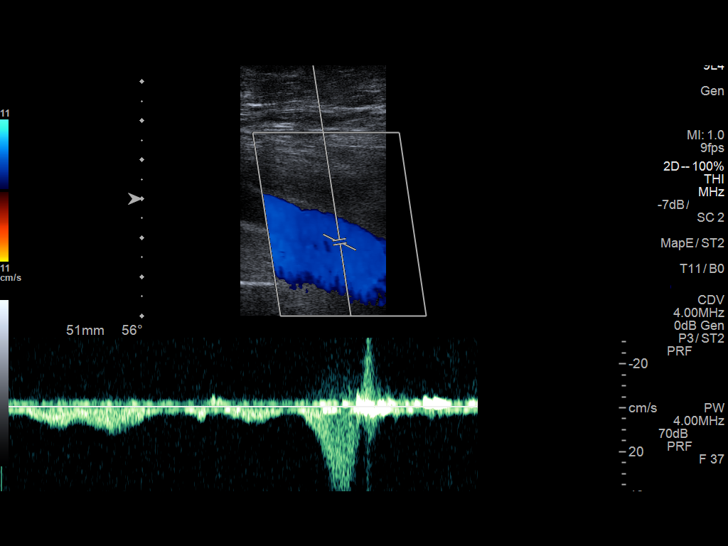
[im 28/43]
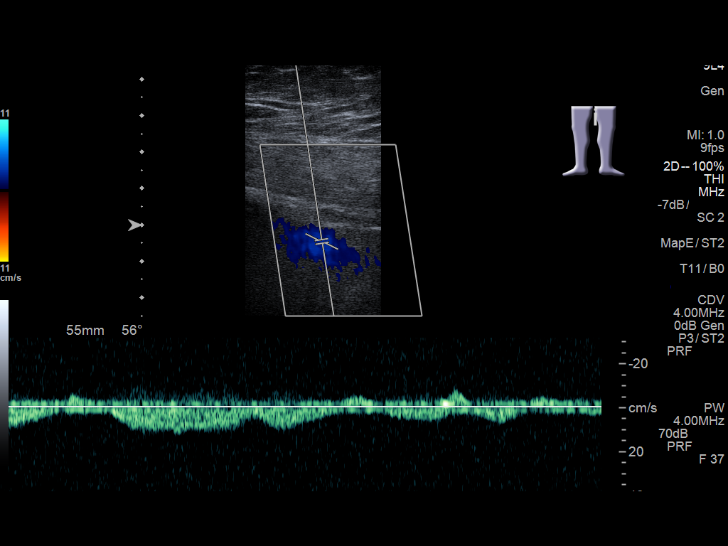
[im 32/43]
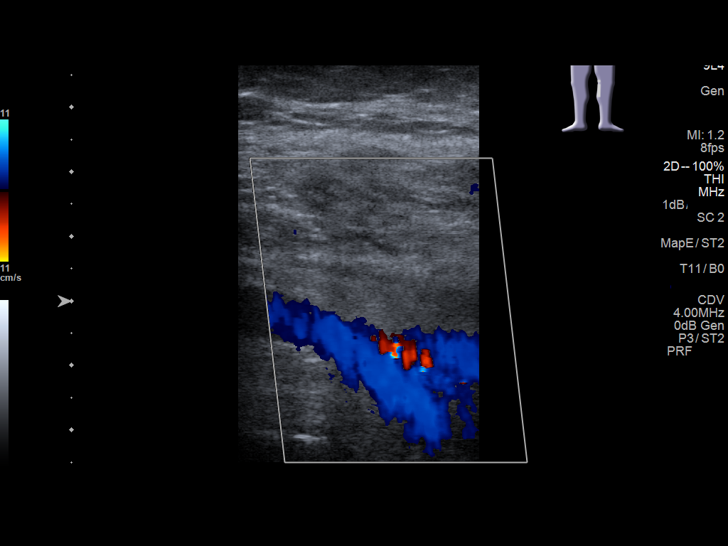
[im 35/43]
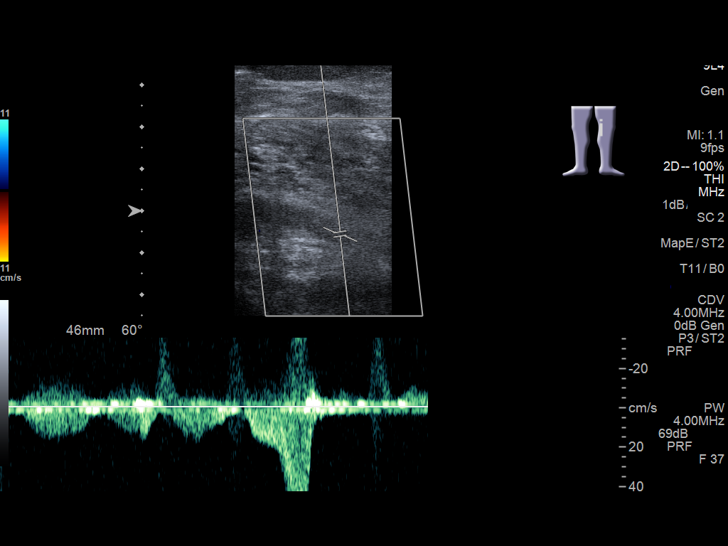
[im 39/43]
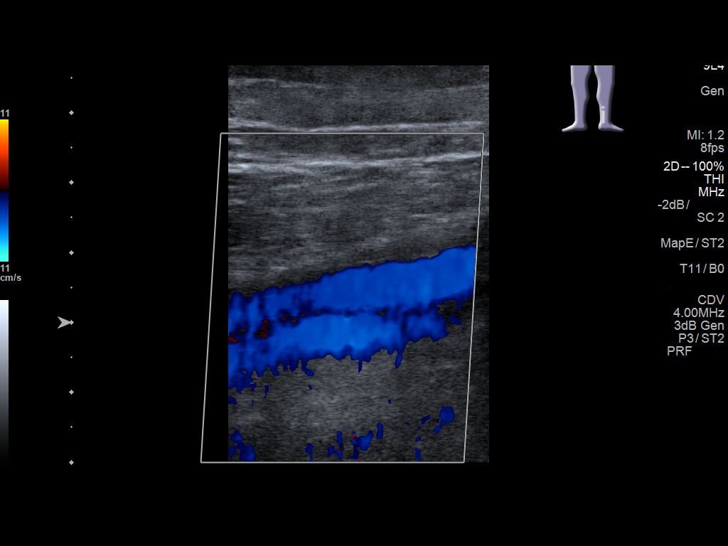
[im 43/43]
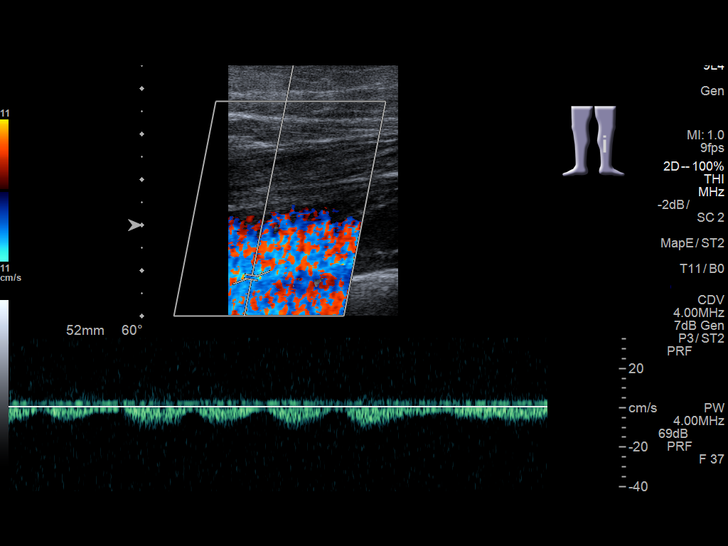

[13 of 24 positions shown; findings below may reference images not displayed]

FINDINGS: RIGHT LOWER EXTREMITY

Common Femoral Vein: No evidence of thrombus. Normal
compressibility, respiratory phasicity and response to augmentation.

Saphenofemoral Junction: No evidence of thrombus. Normal
compressibility and flow on color Doppler imaging.

Profunda Femoral Vein: No evidence of thrombus. Normal
compressibility and flow on color Doppler imaging.

Femoral Vein: No evidence of thrombus. Normal compressibility,
respiratory phasicity and response to augmentation.

Popliteal Vein: No evidence of thrombus. Normal compressibility,
respiratory phasicity and response to augmentation.

Calf Veins: No evidence of thrombus. Normal compressibility and flow
on color Doppler imaging. The right peroneal vein is not visualized
on this study.

Superficial Great Saphenous Vein: No evidence of thrombus. Normal
compressibility and flow on color Doppler imaging.

Venous Reflux:  None.

Other Findings:  None.

LEFT LOWER EXTREMITY

Common Femoral Vein: No evidence of thrombus. Normal
compressibility, respiratory phasicity and response to augmentation.

Saphenofemoral Junction: No evidence of thrombus. Normal
compressibility and flow on color Doppler imaging.

Profunda Femoral Vein: No evidence of thrombus. Normal
compressibility and flow on color Doppler imaging.

Femoral Vein: No evidence of thrombus. Normal compressibility,
respiratory phasicity and response to augmentation.

Popliteal Vein: No evidence of thrombus. Normal compressibility,
respiratory phasicity and response to augmentation.

Calf Veins: No evidence of thrombus. Normal compressibility and flow
on color Doppler imaging.

Superficial Great Saphenous Vein: No evidence of thrombus. Normal
compressibility and flow on color Doppler imaging.

Venous Reflux:  None.

Other Findings:  None.
IMPRESSION: No evidence of deep venous thrombosis.

## 2016-06-16 ENCOUNTER — Encounter: Payer: Self-pay | Admitting: Emergency Medicine

## 2016-06-16 ENCOUNTER — Emergency Department
Admission: EM | Admit: 2016-06-16 | Discharge: 2016-06-16 | Disposition: A | Discharge status: Home / Self Care | Payer: Medicare Other | Attending: Emergency Medicine | Admitting: Emergency Medicine

## 2016-06-16 DIAGNOSIS — M10041 Idiopathic gout, right hand: Secondary | ICD-10-CM | POA: Insufficient documentation

## 2016-06-16 DIAGNOSIS — Z79899 Other long term (current) drug therapy: Secondary | ICD-10-CM | POA: Insufficient documentation

## 2016-06-16 DIAGNOSIS — Z87891 Personal history of nicotine dependence: Secondary | ICD-10-CM | POA: Insufficient documentation

## 2016-06-16 DIAGNOSIS — E119 Type 2 diabetes mellitus without complications: Secondary | ICD-10-CM | POA: Diagnosis not present

## 2016-06-16 DIAGNOSIS — I110 Hypertensive heart disease with heart failure: Secondary | ICD-10-CM | POA: Diagnosis not present

## 2016-06-16 DIAGNOSIS — I5032 Chronic diastolic (congestive) heart failure: Secondary | ICD-10-CM | POA: Insufficient documentation

## 2016-06-16 DIAGNOSIS — Z7982 Long term (current) use of aspirin: Secondary | ICD-10-CM | POA: Diagnosis not present

## 2016-06-16 DIAGNOSIS — M25541 Pain in joints of right hand: Secondary | ICD-10-CM | POA: Diagnosis present

## 2016-06-16 DIAGNOSIS — J449 Chronic obstructive pulmonary disease, unspecified: Secondary | ICD-10-CM | POA: Insufficient documentation

## 2016-06-16 DIAGNOSIS — I2510 Atherosclerotic heart disease of native coronary artery without angina pectoris: Secondary | ICD-10-CM | POA: Insufficient documentation

## 2016-06-16 DIAGNOSIS — M109 Gout, unspecified: Secondary | ICD-10-CM

## 2016-06-16 LAB — CBC WITH DIFFERENTIAL/PLATELET
BASOS PCT: 2 %
Basophils Absolute: 0.1 10*3/uL (ref 0–0.1)
Eosinophils Absolute: 0.3 10*3/uL (ref 0–0.7)
Eosinophils Relative: 5 %
HEMATOCRIT: 34.4 % — AB (ref 40.0–52.0)
HEMOGLOBIN: 11.6 g/dL — AB (ref 13.0–18.0)
LYMPHS ABS: 1 10*3/uL (ref 1.0–3.6)
LYMPHS PCT: 19 %
MCH: 29.4 pg (ref 26.0–34.0)
MCHC: 33.7 g/dL (ref 32.0–36.0)
MCV: 87.2 fL (ref 80.0–100.0)
MONOS PCT: 10 %
Monocytes Absolute: 0.6 10*3/uL (ref 0.2–1.0)
NEUTROS ABS: 3.4 10*3/uL (ref 1.4–6.5)
NEUTROS PCT: 64 %
Platelets: 214 10*3/uL (ref 150–440)
RBC: 3.94 MIL/uL — ABNORMAL LOW (ref 4.40–5.90)
RDW: 16.2 % — ABNORMAL HIGH (ref 11.5–14.5)
WBC: 5.4 10*3/uL (ref 3.8–10.6)

## 2016-06-16 LAB — BASIC METABOLIC PANEL
ANION GAP: 9 (ref 5–15)
BUN: 16 mg/dL (ref 6–20)
CHLORIDE: 107 mmol/L (ref 101–111)
CO2: 24 mmol/L (ref 22–32)
CREATININE: 1.1 mg/dL (ref 0.61–1.24)
Calcium: 8.9 mg/dL (ref 8.9–10.3)
GFR calc non Af Amer: >60 mL/min (ref >60)
Glucose, Bld: 101 mg/dL — ABNORMAL HIGH (ref 65–99)
Potassium: 3.6 mmol/L (ref 3.5–5.1)
Sodium: 140 mmol/L (ref 135–145)

## 2016-06-16 MED ORDER — OXYCODONE-ACETAMINOPHEN 5-325 MG PO TABS
2.0000 | ORAL_TABLET | Freq: Once | ORAL | Status: AC
  Administered 2016-06-16: 2 via ORAL

## 2016-06-16 MED ORDER — OXYCODONE-ACETAMINOPHEN 5-325 MG PO TABS: 1.0000 | ORAL_TABLET | Freq: Four times a day (QID) | ORAL | 0 refills | Status: DC | PRN

## 2016-06-16 MED ORDER — OXYCODONE-ACETAMINOPHEN 5-325 MG PO TABS
ORAL_TABLET | ORAL | Status: AC
  Administered 2016-06-16: 2 via ORAL
  Filled 2016-06-16: qty 2

## 2016-06-16 NOTE — ED Triage Notes (Signed)
Hand swelling since Monday.  Has history of gout.  Right swelling to right third, fifth, and knuckles.  Pain to right hand.

## 2016-06-16 NOTE — ED Provider Notes (Signed)
[facility] Emergency Department Provider Note   ____________________________________________   None    (approximate)  I have reviewed the triage vital signs and the nursing notes.   HISTORY  Chief Complaint Hand Pain   HPI Aaron Ball is a 65 y.o. male with a history of gout a recent gastric sleeve procedure was presenting to the emergency department today with what he believes to be a gouty flare in his fingers. He says that he has never had gout in his hands before but says that he has had it in his feet and ankles multiple times in the past. He has a prescription for colchicine at home. He says that this past Monday his right small finger started to become swollen and painful at the PIP joint. He then had swelling as well as the right middle finger as well as the left index finger. He says that he took 2 days of culture seen and that his left index finger is improving but that he has worsening pain to his right middle as well as right small finger. He is requesting medication for pain control. He says that he made an appointment with his primary care doctor prescribes colchicine for October 11.   Past Medical History:  Diagnosis Date  . A-fib (HCC) 04/2011   a. s/p ablation x 2 w/o evi of recurrence   . Chronic diastolic CHF (congestive heart failure) (HCC)    a. echo 2012: EF >55%, nl RVSP  . COPD (chronic obstructive pulmonary disease) (HCC)   . Coronary artery disease    a. s/p stent in RCA in 1997 w/ ISR 2003 & 05/2013 s/p DES to mid RCA 05/2013; b. cardiac cath 02/2014: nonobstructive CAD with patent stent in RCA with 40% ISR, no evi of pulm HTN, nl LVEDP, EF >65%   . Diabetes mellitus (HCC)   . GERD (gastroesophageal reflux disease)   . Hyperlipidemia   . Hypertension   . Morbid obesity (HCC)   . Obesity hypoventilation syndrome (HCC)   . OSA (obstructive sleep apnea)   . Wears hearing aid    right ear    Patient Active Problem List   Diagnosis Date Noted  . Chest pain, central 10/08/2015  . CAD (coronary artery disease) 06/28/2011  . Obesity 06/28/2011  . Hyperlipidemia 06/28/2011  . HTN (hypertension) 06/28/2011    Past Surgical History:  Procedure Laterality Date  . ATRIAL ABLATION SURGERY     A-Fib  . CARDIAC CATHETERIZATION     s/p stent to RCA.   [name] CARDIAC CATHETERIZATION  2008   Dr. Juliann Pares @ [facility] .  [name] CHOLECYSTECTOMY  2012  . heart stint  2013  . HIP FRACTURE SURGERY    . STOMACH SURGERY     gastric sleeve    Prior to Admission medications   Medication Sig Start Date End Date Taking? Authorizing Provider  aspirin 81 MG tablet Take 81 mg by mouth daily. Take 162 mg daily.    Historical Provider, MD  atorvastatin (LIPITOR) 40 MG tablet Take 1 tablet by mouth daily. 08/10/15   Historical Provider, MD  colchicine 0.6 MG tablet Take 0.6 mg by mouth daily. 07/21/15   Historical Provider, MD  gabapentin (NEURONTIN) 300 MG capsule Take 300 mg by mouth at bedtime.    Historical Provider, MD  loratadine (CLARITIN) 10 MG tablet Take 10 mg by mouth daily.    Historical Provider, MD  metoprolol succinate (TOPROL-XL) 25 MG 24 hr tablet Take 1  tablet by mouth daily. 07/26/15   Historical Provider, MD  nitroGLYCERIN (NITROSTAT) 0.4 MG SL tablet Place 0.4 mg under the tongue every 5 (five) minutes as needed.  07/08/15   Historical Provider, MD  oxyCODONE-acetaminophen (ROXICET) 5-325 MG tablet Take 1-2 tablets by mouth every 6 (six) hours as needed. 06/16/16   Myrna Blazeravid Matthew Kanin Lia, MD  traZODone (DESYREL) 100 MG tablet Take 1.5 tablets by mouth daily. 08/19/15   Historical Provider, MD    Allergies Erythromycin  Family History  Problem Relation Age of Onset  . Heart disease Father   . Heart attack Father     Social History Social History  Substance Use Topics  . Smoking status: Former Smoker    Packs/day: 1.00    Years: 30.00    Types: Cigarettes    Quit date: 06/28/2003  . Smokeless tobacco: Never  Used  . Alcohol use No    Review of Systems Constitutional: No fever/chills Eyes: No visual changes. ENT: No sore throat. Cardiovascular: Denies chest pain. Respiratory: Denies shortness of breath. Gastrointestinal: No abdominal pain.  No nausea, no vomiting.  No diarrhea.  No constipation. Genitourinary: Negative for dysuria. Musculoskeletal: Negative for back pain. Skin: Negative for rash. Neurological: Negative for headaches, focal weakness or numbness.  10-point ROS otherwise negative.  ____________________________________________   PHYSICAL EXAM:  VITAL SIGNS: ED Triage Vitals  Enc Vitals Group     BP 06/16/16 1751 (!) 151/89     Pulse Rate 06/16/16 1751 73     Resp 06/16/16 1751 18     Temp 06/16/16 1751 98.2 F (36.8 C)     Temp Source 06/16/16 1751 Oral     SpO2 06/16/16 1751 (!) 72 %     Weight 06/16/16 1752 (!) 312 lb (141.5 kg)     Height 06/16/16 1752 5\' 6"  (1.676 m)     Head Circumference --      Peak Flow --      Pain Score 06/16/16 1752 8     Pain Loc --      Pain Edu? --      Excl. in GC? --     Constitutional: Alert and oriented. Well appearing and in no acute distress. Eyes: Conjunctivae are normal. PERRL. EOMI. Head: Atraumatic. Nose: No congestion/rhinnorhea. Mouth/Throat: Mucous membranes are moist.   Neck: No stridor.   Cardiovascular: Normal rate, regular rhythm. Grossly normal heart sounds.   Respiratory: Normal respiratory effort.  No retractions. Lungs CTAB. Gastrointestinal: Soft and nontender. No distention.  Musculoskeletal:Right small finger with swelling as well as erythema to the PIP. The patient is able to range the finger but with pain over the side of the joint. There is no fluctuance no pus. Similar exam to the right middle finger except there is swelling greater than on the right small finger. There is no tenderness along the flexor sheath. No concentric swelling. Very painful for the patient to move this joint. Minimal erythema  and tenderness to the left PIP on the index finger. No palpable joint effusions. Mild warmth to the right small as well as index fingers. Neurologic:  Normal speech and language. No gross focal neurologic deficits are appreciated. No gait instability. Skin:  Skin is warm, dry and intact. No rash noted. Psychiatric: Mood and affect are normal. Speech and behavior are normal.  ____________________________________________   LABS (all labs ordered are listed, but only abnormal results are displayed)  Labs Reviewed  CBC WITH DIFFERENTIAL/PLATELET - Abnormal; Notable for the following:  Result Value   RBC 3.94 (*)    Hemoglobin 11.6 (*)    HCT 34.4 (*)    RDW 16.2 (*)    All other components within normal limits  BASIC METABOLIC PANEL - Abnormal; Notable for the following:    Glucose, Bld 101 (*)    All other components within normal limits   ____________________________________________  EKG   ____________________________________________  RADIOLOGY   ____________________________________________   PROCEDURES  Procedure(s) performed:   Procedures  Critical Care performed:   ____________________________________________   INITIAL IMPRESSION / ASSESSMENT AND PLAN / ED COURSE  Pertinent labs & imaging results that were available during my care of the patient were reviewed by me and considered in my medical decision making (see chart for details).  Patient reports improvement in the left next finger. Is requesting pain control for this gouty flare. I recommended keeping the hand elevated. He will take his colchicine as prescribed. We discussed in x-ray but the patient says that he would rather try pain control and more conservative measures first before pursuing further workup. Will be discharged home.  Clinical Course     ____________________________________________   FINAL CLINICAL IMPRESSION(S) / ED DIAGNOSES  Final diagnoses:  Acute gout of hand, unspecified  cause, unspecified laterality      NEW MEDICATIONS STARTED DURING THIS VISIT:  Discharge Medication List as of 06/16/2016  8:27 PM       Note:  This document was prepared using Dragon voice recognition software and may include unintentional dictation errors.    Myrna Blazer, MD 06/16/16 2056

## 2016-06-16 NOTE — ED Notes (Signed)
Pt c/o swelling to hand bilat x1week, c/o pain. Pt recently had gastric sleeve surgery. Denies any injury to area or swelling anywhere else. Pt is currently on lasix . Some redness noted to fingers and knuckles.

## 2016-08-22 ENCOUNTER — Emergency Department
Admission: EM | Admit: 2016-08-22 | Discharge: 2016-08-22 | Disposition: A | Discharge status: Home / Self Care | Payer: Medicare Other | Attending: Emergency Medicine | Admitting: Emergency Medicine

## 2016-08-22 ENCOUNTER — Encounter: Payer: Self-pay | Admitting: Emergency Medicine

## 2016-08-22 DIAGNOSIS — J449 Chronic obstructive pulmonary disease, unspecified: Secondary | ICD-10-CM | POA: Insufficient documentation

## 2016-08-22 DIAGNOSIS — E119 Type 2 diabetes mellitus without complications: Secondary | ICD-10-CM | POA: Diagnosis not present

## 2016-08-22 DIAGNOSIS — Z87891 Personal history of nicotine dependence: Secondary | ICD-10-CM | POA: Insufficient documentation

## 2016-08-22 DIAGNOSIS — M25531 Pain in right wrist: Secondary | ICD-10-CM | POA: Diagnosis present

## 2016-08-22 DIAGNOSIS — M654 Radial styloid tenosynovitis [de Quervain]: Secondary | ICD-10-CM

## 2016-08-22 DIAGNOSIS — I110 Hypertensive heart disease with heart failure: Secondary | ICD-10-CM | POA: Insufficient documentation

## 2016-08-22 DIAGNOSIS — I2510 Atherosclerotic heart disease of native coronary artery without angina pectoris: Secondary | ICD-10-CM | POA: Insufficient documentation

## 2016-08-22 DIAGNOSIS — Z79899 Other long term (current) drug therapy: Secondary | ICD-10-CM | POA: Insufficient documentation

## 2016-08-22 DIAGNOSIS — I5032 Chronic diastolic (congestive) heart failure: Secondary | ICD-10-CM | POA: Insufficient documentation

## 2016-08-22 MED ORDER — DICLOFENAC SODIUM 1 % TD GEL: 2.0000 g | Freq: Four times a day (QID) | TRANSDERMAL | 0 refills | Status: DC

## 2016-08-22 NOTE — ED Provider Notes (Signed)
[facility] Emergency Department Provider Note  ____________________________________________  Time seen: Approximately 2:40 PM  I have reviewed the triage vital signs and the nursing notes.   HISTORY  Chief Complaint Wrist Pain    HPI Aaron Ball is a 65 y.o. male that presents with 6 days of right wrist and thumb pain. Patient has full movement of fingers 2 through 5. Patient has limited movement of the thumb. Patient has tenderness to palpation at base of thumb and on radial side wrist. No numbness or tingling. Patient has not taken anything for pain. Patient tried a brace at home but the brace was too small. Patient had gastric bypass surgery in September and cannot take NSAIDs.    Past Medical History:  Diagnosis Date  . A-fib (HCC) 04/2011   a. s/p ablation x 2 w/o evi of recurrence   . Chronic diastolic CHF (congestive heart failure) (HCC)    a. echo 2012: EF >55%, nl RVSP  . COPD (chronic obstructive pulmonary disease) (HCC)   . Coronary artery disease    a. s/p stent in RCA in 1997 w/ ISR 2003 & 05/2013 s/p DES to mid RCA 05/2013; b. cardiac cath 02/2014: nonobstructive CAD with patent stent in RCA with 40% ISR, no evi of pulm HTN, nl LVEDP, EF >65%   . Diabetes mellitus (HCC)   . GERD (gastroesophageal reflux disease)   . Hyperlipidemia   . Hypertension   . Morbid obesity (HCC)   . Obesity hypoventilation syndrome (HCC)   . OSA (obstructive sleep apnea)   . Wears hearing aid    right ear    Patient Active Problem List   Diagnosis Date Noted  . Chest pain, central 10/08/2015  . CAD (coronary artery disease) 06/28/2011  . Obesity 06/28/2011  . Hyperlipidemia 06/28/2011  . HTN (hypertension) 06/28/2011    Past Surgical History:  Procedure Laterality Date  . ATRIAL ABLATION SURGERY     A-Fib  . BARIATRIC SURGERY    . CARDIAC CATHETERIZATION     s/p stent to RCA.   [name] CARDIAC CATHETERIZATION  2008   Dr. Juliann Pares @ [facility] .  [name]  CHOLECYSTECTOMY  2012  . heart stint  2013  . HIP FRACTURE SURGERY    . STOMACH SURGERY     gastric sleeve    Prior to Admission medications   Medication Sig Start Date End Date Taking? Authorizing Provider  aspirin 81 MG tablet Take 81 mg by mouth daily. Take 162 mg daily.    Historical Provider, MD  atorvastatin (LIPITOR) 40 MG tablet Take 1 tablet by mouth daily. 08/10/15   Historical Provider, MD  colchicine 0.6 MG tablet Take 0.6 mg by mouth daily. 07/21/15   Historical Provider, MD  diclofenac sodium (VOLTAREN) 1 % GEL Apply 2 g topically 4 (four) times daily. 08/22/16   Enid Derry, PA-C  gabapentin (NEURONTIN) 300 MG capsule Take 300 mg by mouth at bedtime.    Historical Provider, MD  metoprolol succinate (TOPROL-XL) 25 MG 24 hr tablet Take 1 tablet by mouth daily. 07/26/15   Historical Provider, MD  nitroGLYCERIN (NITROSTAT) 0.4 MG SL tablet Place 0.4 mg under the tongue every 5 (five) minutes as needed.  07/08/15   Historical Provider, MD  traZODone (DESYREL) 100 MG tablet Take 1.5 tablets by mouth daily. 08/19/15   Historical Provider, MD    Allergies Erythromycin  Family History  Problem Relation Age of Onset  . Heart disease Father   . Heart attack  Father     Social History Social History  Substance Use Topics  . Smoking status: Former Smoker    Packs/day: 1.00    Years: 30.00    Types: Cigarettes    Quit date: 06/28/2003  . Smokeless tobacco: Never Used  . Alcohol use No     Review of Systems  Cardiovascular: no chest pain. Respiratory: no cough. No SOB. Gastrointestinal: No abdominal pain.  Musculoskeletal: Negative for arm pain or shoulder pain Skin: Negative for rash, abrasions, lacerations, ecchymosis.    ____________________________________________   PHYSICAL EXAM:  VITAL SIGNS: ED Triage Vitals [08/22/16 1357]  Enc Vitals Group     BP (!) 146/81     Pulse Rate 76     Resp 20     Temp 98.3 F (36.8 C)     Temp Source Oral     SpO2 98  %     Weight 298 lb (135.2 kg)     Height 5\' 6"  (1.676 m)     Head Circumference      Peak Flow      Pain Score 6     Pain Loc      Pain Edu?      Excl. in GC?      Constitutional: Alert and oriented. Well appearing and in no acute distress. Eyes: Conjunctivae are normal.  Head: Atraumatic. ENT:      Ears:       Nose: No congestion/rhinnorhea.      Mouth/Throat: Mucous membranes are moist.  Neck: No stridor.   Cardiovascular: Normal rate, regular rhythm. Normal S1 and S2.  Good peripheral circulation. Respiratory: Normal respiratory effort without tachypnea or retractions. Lungs CTAB. Good air entry to the bases with no decreased or absent breath sounds. Musculoskeletal: Full range of motion to all extremities. No gross deformities appreciated. Tenderness to palpation at base of thumb. Positive Finkelstein test. Neurologic:  Normal speech and language. No gross focal neurologic deficits are appreciated.  Skin:  Skin is warm, dry and intact. No rash noted. Psychiatric: Mood and affect are normal. Speech and behavior are normal. Patient exhibits appropriate insight and judgement.   ____________________________________________   LABS (all labs ordered are listed, but only abnormal results are displayed)  Labs Reviewed - No data to display ____________________________________________  EKG   ____________________________________________  RADIOLOGY   No results found.  ____________________________________________    PROCEDURES  Procedure(s) performed:    Procedures    Medications - No data to display   ____________________________________________   INITIAL IMPRESSION / ASSESSMENT AND PLAN / ED COURSE  Pertinent labs & imaging results that were available during my care of the patient were reviewed by me and considered in my medical decision making (see chart for details).  Review of the [place] CSRS was performed in accordance of the [facility] prior to dispensing  any controlled drugs.  Clinical Course     Patient's diagnosis is consistent with De Quervains Tenosynovitis. Patient recently had gastric bypass surgery and cannot take NSAIDs. Patient was given prescription for Volteran gel. Wrist was placed in thumb spica splint. Patient is to follow up with PCP as needed or otherwise directed. Patient is given ED precautions to return to the ED for any worsening or new symptoms.     ____________________________________________  FINAL CLINICAL IMPRESSION(S) / ED DIAGNOSES  Final diagnoses:  De Quervain's tenosynovitis      NEW MEDICATIONS STARTED DURING THIS VISIT:  Discharge Medication List as of 08/22/2016  3:26 PM  START taking these medications   Details  diclofenac sodium (VOLTAREN) 1 % GEL Apply 2 g topically 4 (four) times daily., Starting Mon 08/22/2016, Print            This chart was dictated using voice recognition software/Dragon. Despite best efforts to proofread, errors can occur which can change the meaning. Any change was purely unintentional.   Enid DerryAshley Gleb Mcguire, PA-C 08/22/16 1622    Sharman CheekPhillip Stafford, MD 08/23/16 2350

## 2016-08-22 NOTE — ED Triage Notes (Signed)
Presents with pain to right wrist /thumb area since last Tuesday  Denies any injury

## 2016-10-28 DIAGNOSIS — H61001 Unspecified perichondritis of right external ear: Secondary | ICD-10-CM | POA: Insufficient documentation

## 2016-10-28 DIAGNOSIS — L219 Seborrheic dermatitis, unspecified: Secondary | ICD-10-CM | POA: Insufficient documentation

## 2017-01-26 IMAGING — CR DG CHEST 2V
1 series · 2 of 2 positions shown · non-contrast
Comparison: PA and lateral chest 08/19/2015 and 02/16/2013.

CLINICAL DATA: Chest pain, shortness of breath and diarrhea
beginning today. Initial encounter.

EXAM:
CHEST  2 VIEW

[Series 1: dg chest 2 view · 0.14mm/px · 2 of 2 slices shown]
[im 1/2]
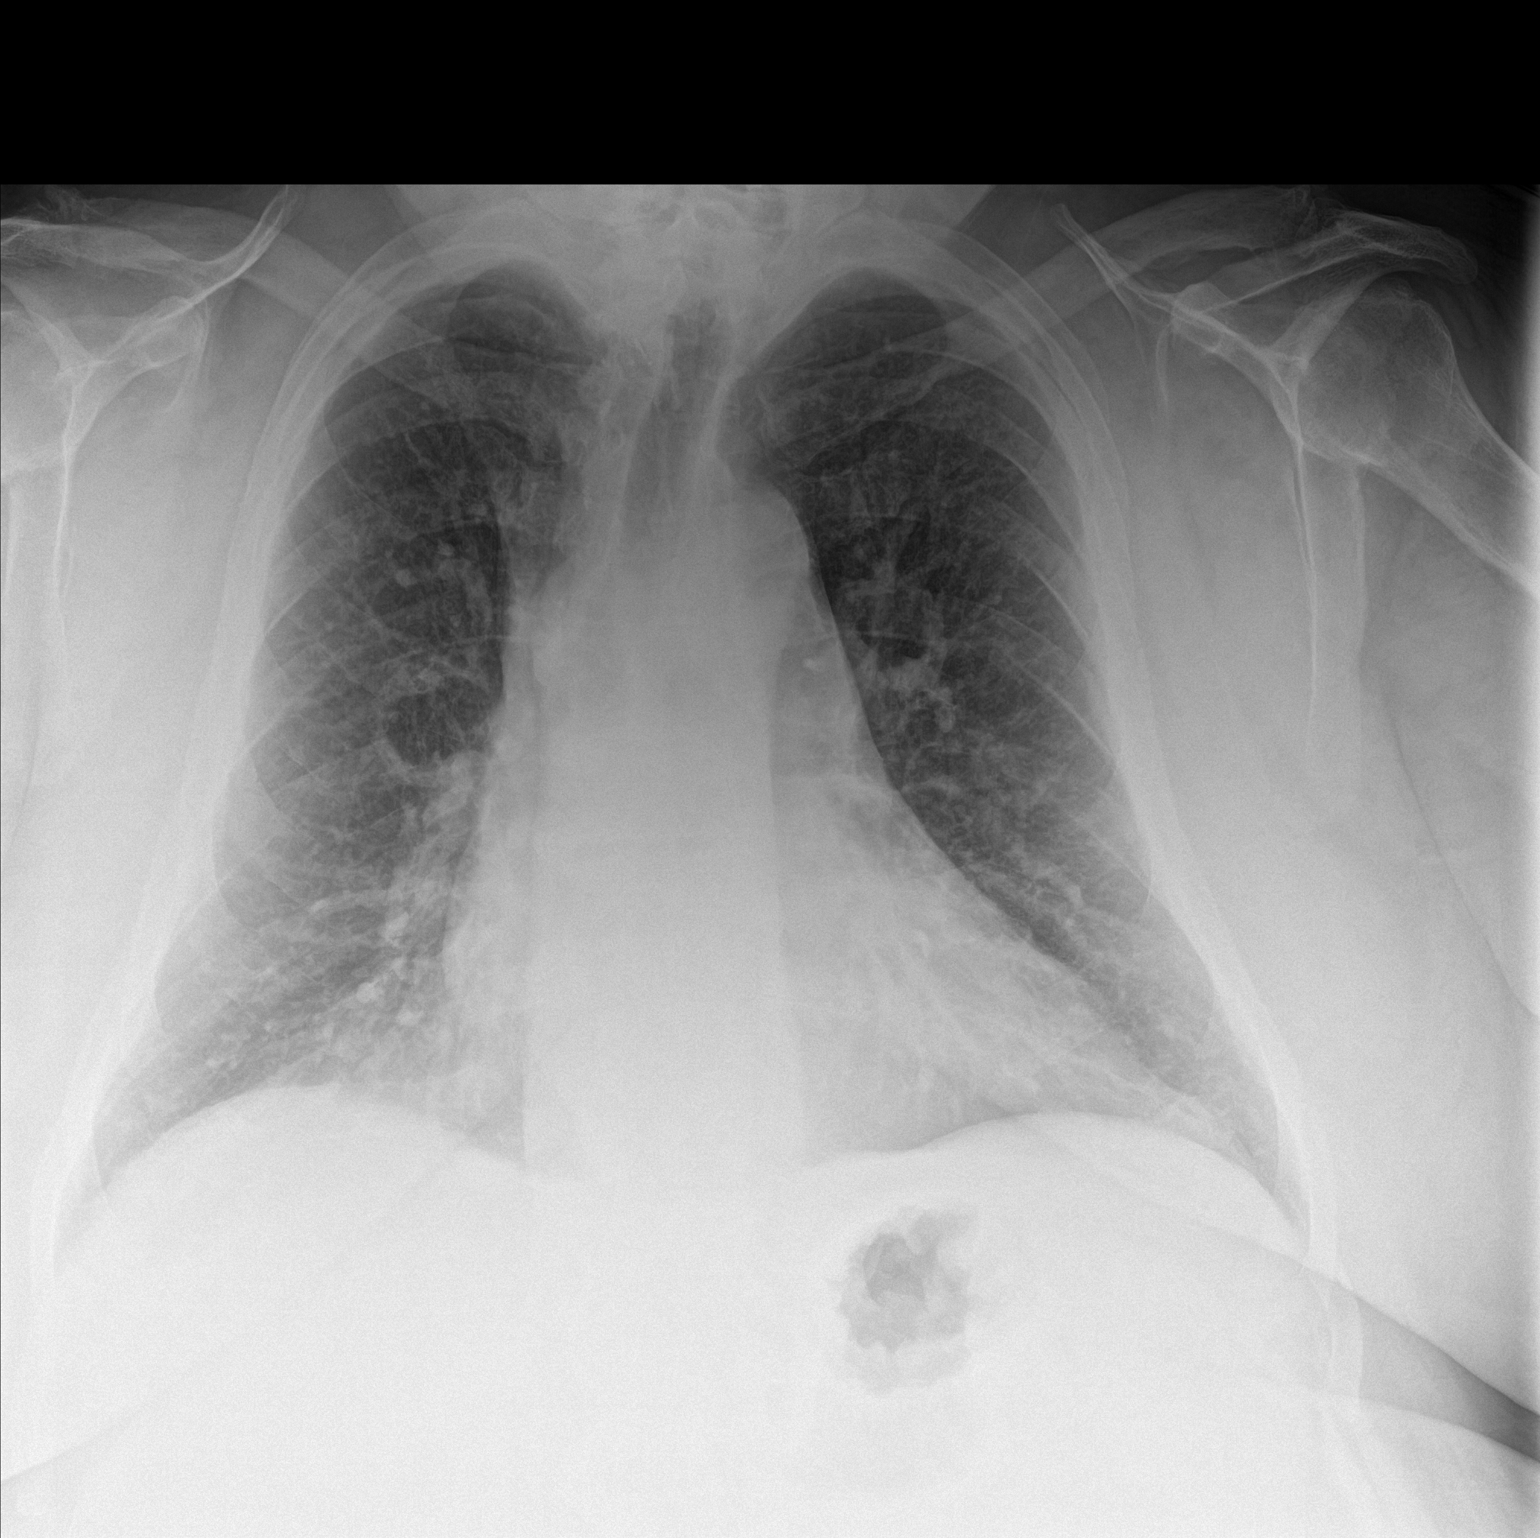
[im 2/2]
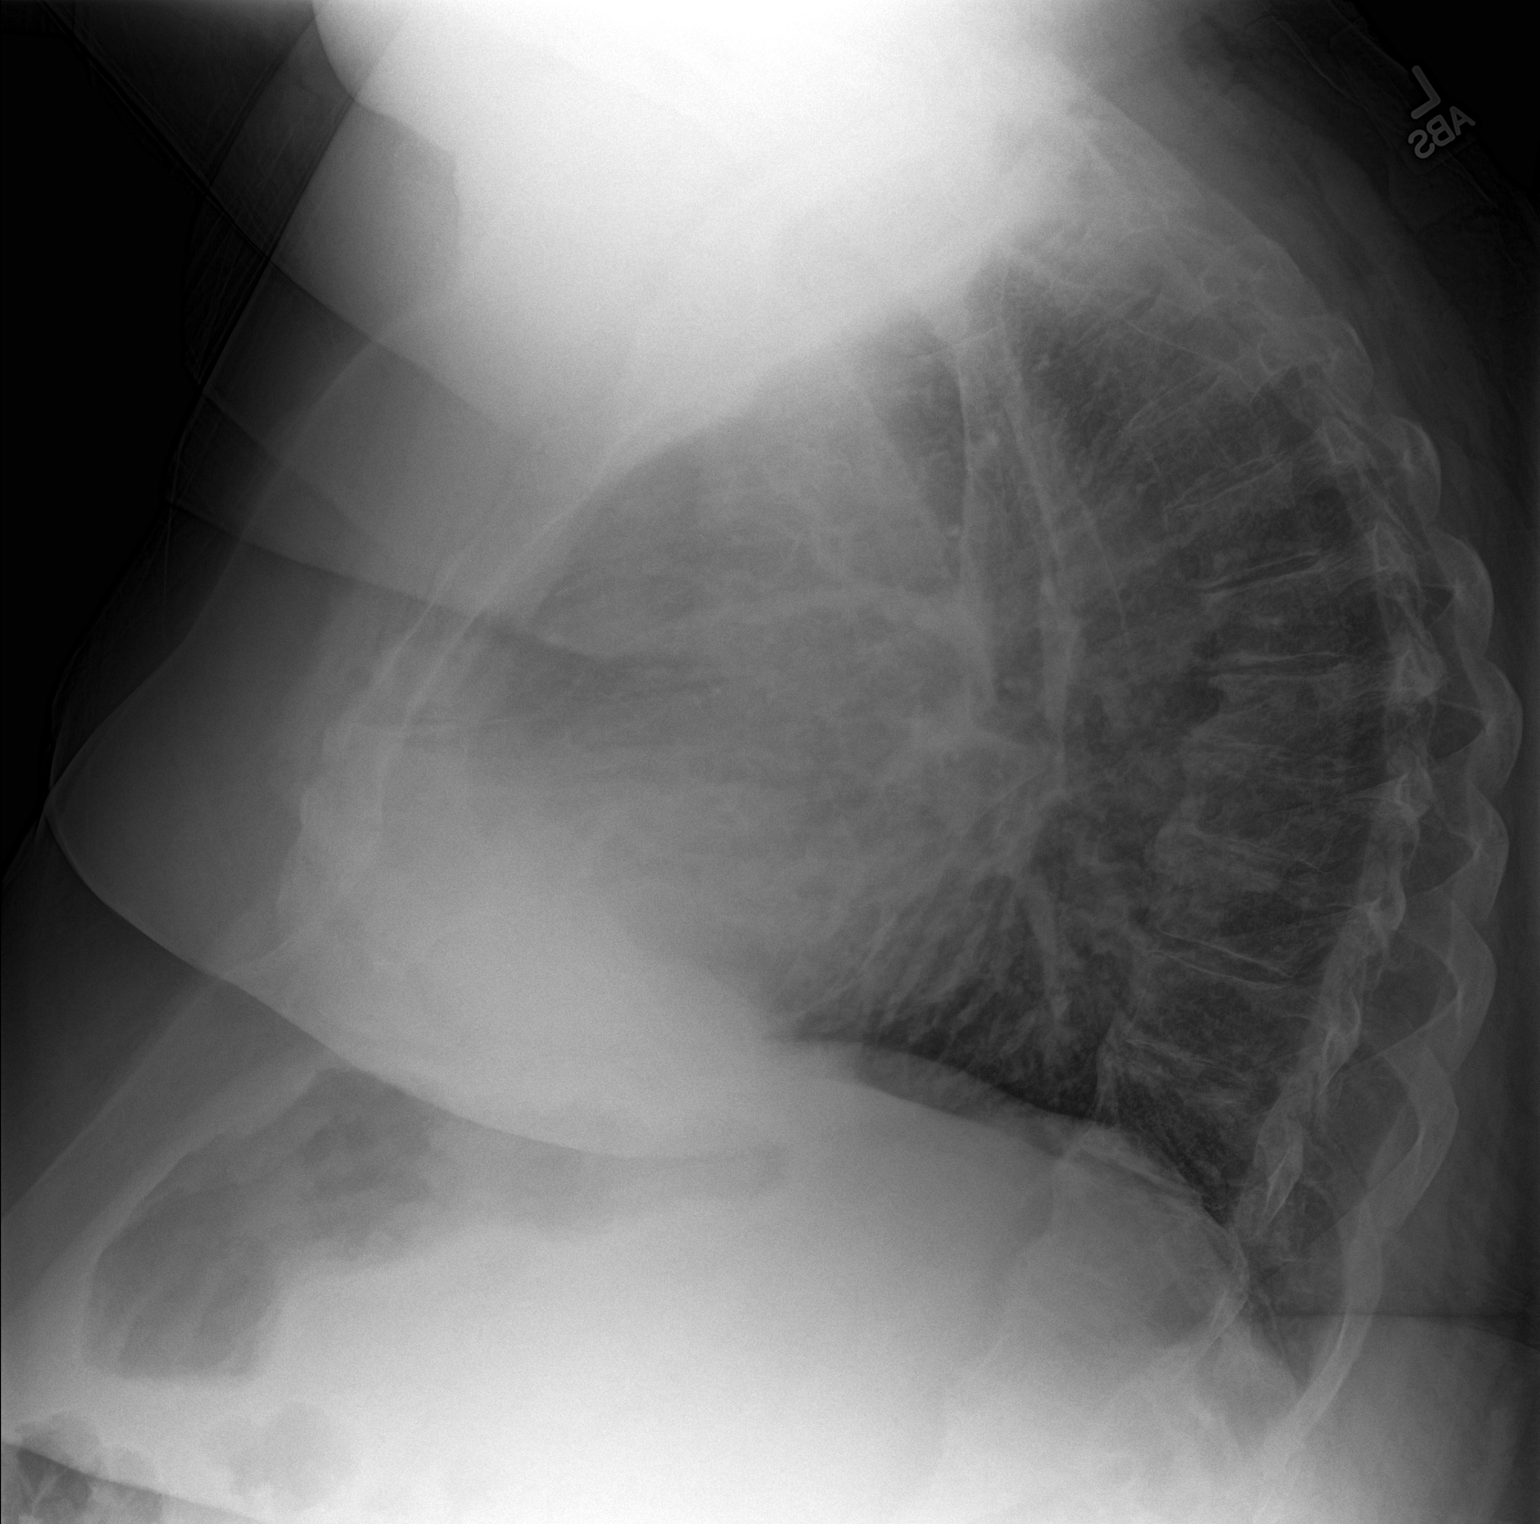

[2 of 2 positions shown; findings below may reference images not displayed]

FINDINGS: The lungs are clear. There is mild cardiomegaly. No pneumothorax or
pleural effusion. No focal bony abnormality.
IMPRESSION: No acute disease.

## 2017-01-27 IMAGING — NM NM PULMONARY VENT & PERF
2 series · 16 of 16 positions shown · non-contrast
Comparison: Chest radiograph 10/08/2015

CLINICAL DATA: Pleuritic chest pain. History of heart disease with
cardiac stents.

EXAM:
NUCLEAR MEDICINE VENTILATION - PERFUSION LUNG SCAN
TECHNIQUE: Ventilation images were obtained in multiple projections using
inhaled aerosol 4c-HHm DTPA. Perfusion images were obtained in
multiple projections after intravenous injection of 4c-HHm MAA.
RADIOPHARMACEUTICALS:  32.95 Wechnetium-77m DTPA aerosol inhalation
and 3.99 Wechnetium-77m MAA IV

[Series 1000: lung ventilation · 3.90mm/px · 4 acquisitions, 8 frames shown]
[im 1/4]
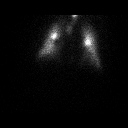
[im 1/4]
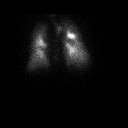
[im 2/4]
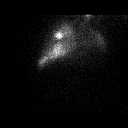
[im 2/4]
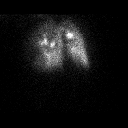
[im 3/4  full-range]
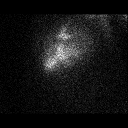
[im 3/4  full-range]
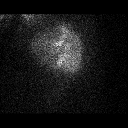
[im 4/4]
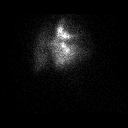
[im 4/4]
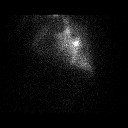

[Series 1000: lung perfusion · 1.95mm/px · 4 acquisitions, 8 frames shown]
[im 1/4]
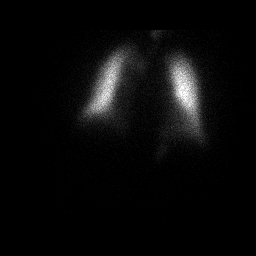
[im 1/4]
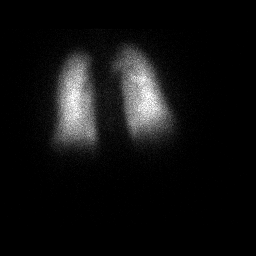
[im 2/4]
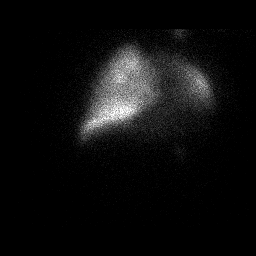
[im 2/4]
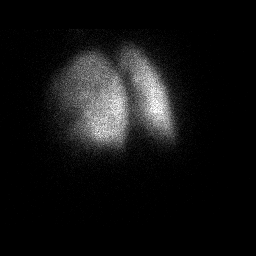
[im 3/4  full-range]
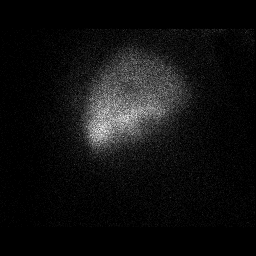
[im 3/4  full-range]
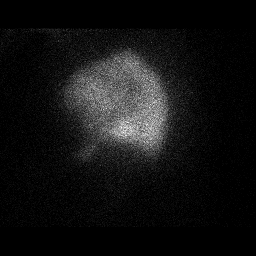
[im 4/4]
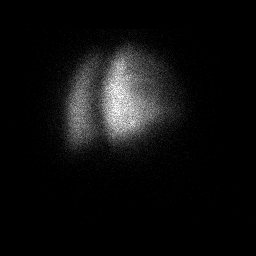
[im 4/4]
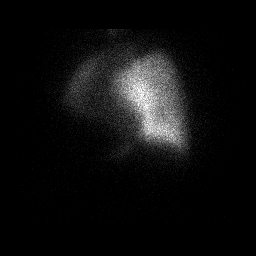

[16 of 16 positions shown; findings below may reference images not displayed]

FINDINGS: Ventilation: No focal ventilation defect.

Perfusion: No wedge shaped peripheral perfusion defects to suggest
acute pulmonary embolism.
IMPRESSION: Low probability (less than 20%) of pulmonary embolus.

## 2017-07-02 ENCOUNTER — Emergency Department
Admission: EM | Admit: 2017-07-02 | Discharge: 2017-07-02 | Disposition: A | Discharge status: Home / Self Care | Payer: Medicare Other | Attending: Emergency Medicine | Admitting: Emergency Medicine

## 2017-07-02 ENCOUNTER — Emergency Department: Payer: Medicare Other

## 2017-07-02 ENCOUNTER — Encounter: Payer: Self-pay | Admitting: *Deleted

## 2017-07-02 DIAGNOSIS — M545 Low back pain: Secondary | ICD-10-CM | POA: Insufficient documentation

## 2017-07-02 DIAGNOSIS — I10 Essential (primary) hypertension: Secondary | ICD-10-CM | POA: Diagnosis not present

## 2017-07-02 DIAGNOSIS — M5432 Sciatica, left side: Secondary | ICD-10-CM | POA: Insufficient documentation

## 2017-07-02 DIAGNOSIS — J449 Chronic obstructive pulmonary disease, unspecified: Secondary | ICD-10-CM | POA: Insufficient documentation

## 2017-07-02 DIAGNOSIS — I2510 Atherosclerotic heart disease of native coronary artery without angina pectoris: Secondary | ICD-10-CM | POA: Insufficient documentation

## 2017-07-02 DIAGNOSIS — M5440 Lumbago with sciatica, unspecified side: Secondary | ICD-10-CM

## 2017-07-02 DIAGNOSIS — I5032 Chronic diastolic (congestive) heart failure: Secondary | ICD-10-CM | POA: Insufficient documentation

## 2017-07-02 DIAGNOSIS — Z87891 Personal history of nicotine dependence: Secondary | ICD-10-CM | POA: Diagnosis not present

## 2017-07-02 DIAGNOSIS — Z79899 Other long term (current) drug therapy: Secondary | ICD-10-CM | POA: Diagnosis not present

## 2017-07-02 DIAGNOSIS — Z7982 Long term (current) use of aspirin: Secondary | ICD-10-CM | POA: Insufficient documentation

## 2017-07-02 HISTORY — DX: Gout, unspecified: M10.9

## 2017-07-02 MED ORDER — KETOROLAC TROMETHAMINE 30 MG/ML IJ SOLN
30.0000 mg | Freq: Once | INTRAMUSCULAR | Status: AC
  Administered 2017-07-02: 30 mg via INTRAMUSCULAR
  Filled 2017-07-02: qty 1

## 2017-07-02 MED ORDER — PREDNISONE 10 MG (21) PO TBPK: ORAL_TABLET | ORAL | 0 refills | Status: DC

## 2017-07-02 MED ORDER — BACLOFEN 10 MG PO TABS: 10.0000 mg | ORAL_TABLET | Freq: Two times a day (BID) | ORAL | 1 refills | Status: AC

## 2017-07-02 NOTE — ED Notes (Signed)
Patient transported to XR. 

## 2017-07-02 NOTE — ED Notes (Signed)
Shooting pain from left lower back through buttock to the left leg.

## 2017-07-02 NOTE — ED Triage Notes (Signed)
Patient c/o lower back pain that radiates to left hip and left upper thigh. Patient states pain has been present for one week.

## 2017-07-02 NOTE — Discharge Instructions (Signed)
Or with your regular doctor or Dr. Rosita KeaMenz if you are not better in one week.  take the medications as prescribed. You do not need to take your meloxicam polyuric taking a steroid. Be careful of sedation with the muscle relaxer. If you're becoming much worse please return to the emergency department. Apply ice to your back and hip.

## 2017-07-02 NOTE — ED Provider Notes (Signed)
[facility] Emergency Department Provider Note  ____________________________________________   First MD Initiated Contact with Patient 07/02/17 1205     (approximate)  I have reviewed the triage vital signs and the nursing notes.   HISTORY  Chief Complaint Back Pain    HPI Aaron Ball is a 66 y.o. male complains of left-sided lower back pain for over one week. Denies injury. States he is having difficulty standing and walking due to the pain. Pain radiates to the back of the thigh and wraps around to the knee. No history of previous back pain. Did have a hip fracture in 1983 that was on the right hip. Is taking meloxicam daily which hasn't helped. Denies abdominal pain.   Past Medical History:  Diagnosis Date  . A-fib (HCC) 04/2011   a. s/p ablation x 2 w/o evi of recurrence   . Chronic diastolic CHF (congestive heart failure) (HCC)    a. echo 2012: EF >55%, nl RVSP  . COPD (chronic obstructive pulmonary disease) (HCC)   . Coronary artery disease    a. s/p stent in RCA in 1997 w/ ISR 2003 & 05/2013 s/p DES to mid RCA 05/2013; b. cardiac cath 02/2014: nonobstructive CAD with patent stent in RCA with 40% ISR, no evi of pulm HTN, nl LVEDP, EF >65%   . Diabetes mellitus (HCC)   . GERD (gastroesophageal reflux disease)   . Gout   . Hyperlipidemia   . Hypertension   . Morbid obesity (HCC)   . Obesity hypoventilation syndrome (HCC)   . OSA (obstructive sleep apnea)   . Wears hearing aid    right ear    Patient Active Problem List   Diagnosis Date Noted  . Chest pain, central 10/08/2015  . CAD (coronary artery disease) 06/28/2011  . Obesity 06/28/2011  . Hyperlipidemia 06/28/2011  . HTN (hypertension) 06/28/2011    Past Surgical History:  Procedure Laterality Date  . ATRIAL ABLATION SURGERY     A-Fib  . BARIATRIC SURGERY    . CARDIAC CATHETERIZATION     s/p stent to RCA.   [name]. CARDIAC CATHETERIZATION  2008   Dr. Julian[facility] [facility] .  [name].  CHOLECYSTECTOMY  2012  . heart stint  2013  . HIP FRACTURE SURGERY    . STOMACH SURGERY     gastric sleeve    Prior to Admission medications   Medication Sig Start Date End Date Taking? Authorizing Provider  aspirin 81 MG tablet Take 81 mg by mouth daily. Take 162 mg daily.    [provider]  atorvastatin (LIPITOR) 40 MG tablet Take 1 tablet by mouth daily. 08/10/15   [provider]  colchicine 0.6 MG tablet Take 0.6 mg by mouth daily. 07/21/15   [provider]  diclofenac sodium (VOLTAREN) 1 % GEL Apply 2 g topically 4 (four) times daily. 08/22/16   Enid DerryWagner, Ashley, PA-C  gabapentin (NEURONTIN) 300 MG capsule Take 300 mg by mouth at bedtime.    [provider]  metoprolol succinate (TOPROL-XL) 25 MG 24 hr tablet Take 1 tablet by mouth daily. 07/26/15   [provider]  nitroGLYCERIN (NITROSTAT) 0.4 MG SL tablet Place 0.4 mg under the tongue every 5 (five) minutes as needed.  07/08/15   [provider]  traZODone (DESYREL) 100 MG tablet Take 1.5 tablets by mouth daily. 08/19/15   [provider]    Allergies Erythromycin  Family History  Problem Relation Age of Onset  . Heart disease Father   .  Heart attack Father     Social History Social History  Substance Use Topics  . Smoking status: Former Smoker    Packs/day: 1.00    Years: 30.00    Types: Cigarettes    Quit date: 06/28/2003  . Smokeless tobacco: Never Used  . Alcohol use No    Review of Systems  Constitutional: No fever/chills Eyes: No visual changes. ENT: No sore throat. Respiratory: Denies cough Genitourinary: Negative for dysuria. Musculoskeletal: Positive for back pain. Skin: Negative for rash.    ____________________________________________   PHYSICAL EXAM:  VITAL SIGNS: ED Triage Vitals  Enc Vitals Group     BP 07/02/17 1151 (!) 151/82     Pulse Rate 07/02/17 1151 60     Resp 07/02/17 1151 18     Temp 07/02/17 1151 98 F (36.7  C)     Temp Source 07/02/17 1151 Oral     SpO2 07/02/17 1151 100 %     Weight 07/02/17 1152 300 lb (136.1 kg)     Height 07/02/17 1152 5\' 6"  (1.676 m)     Head Circumference --      Peak Flow --      Pain Score 07/02/17 1151 8     Pain Loc --      Pain Edu? --      Excl. in GC? --     Constitutional: Alert and oriented. Well appearing and in no acute distress. Eyes: Conjunctivae are normal.  Head: Atraumatic. Nose: No congestion/rhinnorhea. Mouth/Throat: Mucous membranes are moist.   Cardiovascular: Normal rate, regular rhythm. Respiratory: Normal respiratory effort.  No retractions GU: deferred Musculoskeletal: Lumbar spine and SI joint on the left side are tender to palpation patient walks slowly. Left hip is nontender. Decreased range of motion of the left hip due to the patient's weight. Neurovascular is intact. Negative straight leg raise. Neurologic:  Normal speech and language.  Skin:  Skin is warm, dry and intact. No rash noted. Psychiatric: Mood and affect are normal. Speech and behavior are normal.  ____________________________________________   LABS (all labs ordered are listed, but only abnormal results are displayed)  Labs Reviewed - No data to display ____________________________________________   ____________________________________________  RADIOLOGY  Diagnostic x-ray of the lumbar spine shows degenerative changes at the facets.  ____________________________________________   PROCEDURES  Procedure(s) performed: No      ____________________________________________   INITIAL IMPRESSION / ASSESSMENT AND PLAN / ED COURSE  Pertinent labs & imaging results that were available during my care of the patient were reviewed by me and considered in my medical decision making (see chart for details).  Aaron Ball is a 66 year old male. Complaining of low back pain for about a week. Pain has been increasing over the past few days. States it's beginning to  be hard to stand took a look hurts to sit and walk. Ordered x-ray of the lumbar spine. We'll give Toradol 30 mg IM. Patient is here alone. ----------------------------------------- 1:15 PM on 07/02/2017 -----------------------------------------  's custody x-ray findings with the patient. Explained the treatment plan of steroids and muscle relaxers. He is to apply ice to his back. Is to stop his meloxicam while taking the steroid. He is to follow-up with his doctor or Dr Rosita Kea if he is not better in one week. May return to the emergency department if he is feeling worse or having increased pain.  discussed sequela of cauda equina syndrome     ____________________________________________   FINAL CLINICAL IMPRESSION(S) / ED DIAGNOSES  Final  diagnoses:  None      NEW MEDICATIONS STARTED DURING THIS VISIT:  New Prescriptions   No medications on file     Note:  This document was prepared using Dragon voice recognition software and may include unintentional dictation errors.    Faythe Ghee, PA-C 07/02/17 1316    Dionne Bucy, MD 07/02/17 1538

## 2017-12-15 ENCOUNTER — Emergency Department
Admission: EM | Admit: 2017-12-15 | Discharge: 2017-12-15 | Disposition: A | Discharge status: Home / Self Care | Payer: Medicare Other | Attending: Emergency Medicine | Admitting: Emergency Medicine

## 2017-12-15 ENCOUNTER — Encounter: Payer: Self-pay | Admitting: Emergency Medicine

## 2017-12-15 ENCOUNTER — Emergency Department: Payer: Medicare Other

## 2017-12-15 ENCOUNTER — Other Ambulatory Visit: Payer: Self-pay

## 2017-12-15 DIAGNOSIS — J101 Influenza due to other identified influenza virus with other respiratory manifestations: Secondary | ICD-10-CM | POA: Diagnosis not present

## 2017-12-15 DIAGNOSIS — I2510 Atherosclerotic heart disease of native coronary artery without angina pectoris: Secondary | ICD-10-CM | POA: Insufficient documentation

## 2017-12-15 DIAGNOSIS — J449 Chronic obstructive pulmonary disease, unspecified: Secondary | ICD-10-CM | POA: Diagnosis not present

## 2017-12-15 DIAGNOSIS — E119 Type 2 diabetes mellitus without complications: Secondary | ICD-10-CM | POA: Diagnosis not present

## 2017-12-15 DIAGNOSIS — J208 Acute bronchitis due to other specified organisms: Secondary | ICD-10-CM | POA: Diagnosis not present

## 2017-12-15 DIAGNOSIS — Z9049 Acquired absence of other specified parts of digestive tract: Secondary | ICD-10-CM | POA: Insufficient documentation

## 2017-12-15 DIAGNOSIS — Z7982 Long term (current) use of aspirin: Secondary | ICD-10-CM | POA: Diagnosis not present

## 2017-12-15 DIAGNOSIS — R0602 Shortness of breath: Secondary | ICD-10-CM | POA: Diagnosis present

## 2017-12-15 DIAGNOSIS — Z87891 Personal history of nicotine dependence: Secondary | ICD-10-CM | POA: Insufficient documentation

## 2017-12-15 DIAGNOSIS — Z79899 Other long term (current) drug therapy: Secondary | ICD-10-CM | POA: Insufficient documentation

## 2017-12-15 DIAGNOSIS — I5032 Chronic diastolic (congestive) heart failure: Secondary | ICD-10-CM | POA: Diagnosis not present

## 2017-12-15 DIAGNOSIS — Z9884 Bariatric surgery status: Secondary | ICD-10-CM | POA: Insufficient documentation

## 2017-12-15 DIAGNOSIS — I110 Hypertensive heart disease with heart failure: Secondary | ICD-10-CM | POA: Insufficient documentation

## 2017-12-15 LAB — COMPREHENSIVE METABOLIC PANEL
ALBUMIN: 3.7 g/dL (ref 3.5–5.0)
ALK PHOS: 72 U/L (ref 38–126)
ALT: 33 U/L (ref 17–63)
AST: 43 U/L — AB (ref 15–41)
Anion gap: 8 (ref 5–15)
BILIRUBIN TOTAL: 0.4 mg/dL (ref 0.3–1.2)
BUN: 27 mg/dL — AB (ref 6–20)
CALCIUM: 8.3 mg/dL — AB (ref 8.9–10.3)
CO2: 26 mmol/L (ref 22–32)
Chloride: 104 mmol/L (ref 101–111)
Creatinine, Ser: 1.25 mg/dL — ABNORMAL HIGH (ref 0.61–1.24)
GFR calc Af Amer: >60 mL/min (ref >60)
GFR calc non Af Amer: 58 mL/min — ABNORMAL LOW (ref >60)
GLUCOSE: 140 mg/dL — AB (ref 65–99)
Potassium: 4.2 mmol/L (ref 3.5–5.1)
Sodium: 138 mmol/L (ref 135–145)
TOTAL PROTEIN: 7.1 g/dL (ref 6.5–8.1)

## 2017-12-15 LAB — CBC WITH DIFFERENTIAL/PLATELET
BASOS PCT: 1 %
Basophils Absolute: 0.1 10*3/uL (ref 0–0.1)
Eosinophils Absolute: 0.3 10*3/uL (ref 0–0.7)
Eosinophils Relative: 7 %
HEMATOCRIT: 37.3 % — AB (ref 40.0–52.0)
Hemoglobin: 12.2 g/dL — ABNORMAL LOW (ref 13.0–18.0)
Lymphocytes Relative: 19 %
Lymphs Abs: 0.8 10*3/uL — ABNORMAL LOW (ref 1.0–3.6)
MCH: 30.8 pg (ref 26.0–34.0)
MCHC: 32.7 g/dL (ref 32.0–36.0)
MCV: 94.3 fL (ref 80.0–100.0)
MONOS PCT: 16 %
Monocytes Absolute: 0.7 10*3/uL (ref 0.2–1.0)
NEUTROS ABS: 2.5 10*3/uL (ref 1.4–6.5)
NEUTROS PCT: 57 %
Platelets: 188 10*3/uL (ref 150–440)
RBC: 3.96 MIL/uL — ABNORMAL LOW (ref 4.40–5.90)
RDW: 14.8 % — ABNORMAL HIGH (ref 11.5–14.5)
WBC: 4.4 10*3/uL (ref 3.8–10.6)

## 2017-12-15 LAB — TROPONIN I: Troponin I: <0.03 ng/mL (ref <0.03)

## 2017-12-15 LAB — INFLUENZA PANEL BY PCR (TYPE A & B)
Influenza A By PCR: POSITIVE — AB
Influenza B By PCR: NEGATIVE

## 2017-12-15 LAB — BRAIN NATRIURETIC PEPTIDE: B Natriuretic Peptide: 35 pg/mL (ref 0.0–100.0)

## 2017-12-15 MED ORDER — ALBUTEROL SULFATE (2.5 MG/3ML) 0.083% IN NEBU
5.0000 mg | INHALATION_SOLUTION | Freq: Once | RESPIRATORY_TRACT | Status: AC
  Administered 2017-12-15: 5 mg via RESPIRATORY_TRACT
  Filled 2017-12-15: qty 6

## 2017-12-15 MED ORDER — SPACER/AERO CHAMBER MOUTHPIECE MISC: 1.0000 [IU] | 0 refills | Status: DC | PRN

## 2017-12-15 MED ORDER — ALBUTEROL SULFATE HFA 108 (90 BASE) MCG/ACT IN AERS: 2.0000 | INHALATION_SPRAY | Freq: Four times a day (QID) | RESPIRATORY_TRACT | 0 refills | Status: DC | PRN

## 2017-12-15 NOTE — ED Triage Notes (Addendum)
Patient ambulatory to triage with steady gait, SHOB noted; st diff breathing and chest tightness since yesterday; denies CP however; st hx CHF but "it doesn't feel like that"; pt taken to room 26 via w/c by paramedic to be placed on card monitor for EKG & further eval; report called to Vernona RiegerLaura, [place]

## 2017-12-15 NOTE — Discharge Instructions (Signed)
Please use your inhaler as needed for severe symptoms and follow-up with your primary care physician for recheck.  Return to the emergency department sooner for any concerns whatsoever.  It was a pleasure to take care of you today, and thank you for coming to our emergency department.  If you have any questions or concerns before leaving please ask the nurse to grab me and I'm more than happy to go through your aftercare instructions again.  If you were prescribed any opioid pain medication today such as Norco, Vicodin, Percocet, morphine, hydrocodone, or oxycodone please make sure you do not drive when you are taking this medication as it can alter your ability to drive safely.  If you have any concerns once you are home that you are not improving or are in fact getting worse before you can make it to your follow-up appointment, please do not hesitate to call 911 and come back for further evaluation.  Merrily Brittle, MD  Results for orders placed or performed during the hospital encounter of 12/15/17  CBC with Differential/Platelet  Result Value Ref Range   WBC 4.4 3.8 - 10.6 K/uL   RBC 3.96 (L) 4.40 - 5.90 MIL/uL   Hemoglobin 12.2 (L) 13.0 - 18.0 g/dL   HCT 16.1 (L) 09.6 - 04.5 %   MCV 94.3 80.0 - 100.0 fL   MCH 30.8 26.0 - 34.0 pg   MCHC 32.7 32.0 - 36.0 g/dL   RDW 40.9 (H) 81.1 - 91.4 %   Platelets 188 150 - 440 K/uL   Neutrophils Relative % 57 %   Neutro Abs 2.5 1.4 - 6.5 K/uL   Lymphocytes Relative 19 %   Lymphs Abs 0.8 (L) 1.0 - 3.6 K/uL   Monocytes Relative 16 %   Monocytes Absolute 0.7 0.2 - 1.0 K/uL   Eosinophils Relative 7 %   Eosinophils Absolute 0.3 0 - 0.7 K/uL   Basophils Relative 1 %   Basophils Absolute 0.1 0 - 0.1 K/uL  Comprehensive metabolic panel  Result Value Ref Range   Sodium 138 135 - 145 mmol/L   Potassium 4.2 3.5 - 5.1 mmol/L   Chloride 104 101 - 111 mmol/L   CO2 26 22 - 32 mmol/L   Glucose, Bld 140 (H) 65 - 99 mg/dL   BUN 27 (H) 6 - 20 mg/dL   Creatinine, Ser 7.82 (H) 0.61 - 1.24 mg/dL   Calcium 8.3 (L) 8.9 - 10.3 mg/dL   Total Protein 7.1 6.5 - 8.1 g/dL   Albumin 3.7 3.5 - 5.0 g/dL   AST 43 (H) 15 - 41 U/L   ALT 33 17 - 63 U/L   Alkaline Phosphatase 72 38 - 126 U/L   Total Bilirubin 0.4 0.3 - 1.2 mg/dL   GFR calc non Af Amer 58 (L) >60 mL/min   GFR calc Af Amer >60 >60 mL/min   Anion gap 8 5 - 15  Troponin I  Result Value Ref Range   Troponin I <0.03 <0.03 ng/mL  Brain natriuretic peptide  Result Value Ref Range   B Natriuretic Peptide 35.0 0.0 - 100.0 pg/mL  Influenza panel by PCR (type A & B)  Result Value Ref Range   Influenza A By PCR POSITIVE (A) NEGATIVE   Influenza B By PCR NEGATIVE NEGATIVE   Dg Chest Portable 1 View  Result Date: 12/15/2017 CLINICAL DATA:  Former smoker. Shortness of breath starting this morning. EXAM: PORTABLE CHEST 1 VIEW COMPARISON:  10/08/2015 FINDINGS: Mild cardiac enlargement. No  pulmonary vascular congestion. Lungs are clear. No blunting of costophrenic angles. No pneumothorax. Mediastinal contours appear intact. IMPRESSION: Mild cardiac enlargement.  No evidence of active pulmonary disease. Electronically Signed   By: Burman NievesWilliam  Stevens M.D.   On: 12/15/2017 06:54

## 2017-12-15 NOTE — ED Notes (Signed)
Flu test performed on pt, pt sitting upright, no distress noted, cont to monitor

## 2017-12-15 NOTE — ED Provider Notes (Signed)
[facility] Emergency Department Provider Note  ____________________________________________   First MD Initiated Contact with Patient 12/15/17 [phone]     (approximate)  I have reviewed the triage vital signs and the nursing notes.   HISTORY  Chief Complaint Shortness of Breath   HPI Aaron Ball is a 67 y.o. male who self presents to the emergency department with worsening shortness of breath for the past day or so.  He says that he has had roughly 1 week of an upper respiratory tract infection with low-grade fever and dry cough.  He does have a past medical history of congestive heart failure although he says this feels different.  He is morbidly obese and normally sleeps sitting up with no change.  He has not gained weight.  No change in his bilateral lower extremity edema.  Last night he awoke suddenly at 3 in the morning more short of breath so he came to the emergency department.  His symptoms have been insidious onset constant somewhat worse with exertion somewhat improved with rest.  Past Medical History:  Diagnosis Date  . A-fib (HCC) 04/2011   a. s/p ablation x 2 w/o evi of recurrence   . Chronic diastolic CHF (congestive heart failure) (HCC)    a. echo 2012: EF >55%, nl RVSP  . COPD (chronic obstructive pulmonary disease) (HCC)   . Coronary artery disease    a. s/p stent in RCA in 1997 w/ ISR 2003 & 05/2013 s/p DES to mid RCA 05/2013; b. cardiac cath 02/2014: nonobstructive CAD with patent stent in RCA with 40% ISR, no evi of pulm HTN, nl LVEDP, EF >65%   . Diabetes mellitus (HCC)   . GERD (gastroesophageal reflux disease)   . Gout   . Hyperlipidemia   . Hypertension   . Morbid obesity (HCC)   . Obesity hypoventilation syndrome (HCC)   . OSA (obstructive sleep apnea)   . Wears hearing aid    right ear    Patient Active Problem List   Diagnosis Date Noted  . Chest pain, central 10/08/2015  . CAD (coronary artery disease) 06/28/2011  .  Obesity 06/28/2011  . Hyperlipidemia 06/28/2011  . HTN (hypertension) 06/28/2011    Past Surgical History:  Procedure Laterality Date  . ATRIAL ABLATION SURGERY     A-Fib  . BARIATRIC SURGERY    . CARDIAC CATHETERIZATION     s/p stent to RCA.   [name]. CARDIAC CATHETERIZATION  2008   Dr[facility]wood [facility] .  [name]. CHOLECYSTECTOMY  2012  . heart stint  2013  . HIP FRACTURE SURGERY    . STOMACH SURGERY     gastric sleeve    Prior to Admission medications   Medication Sig Start Date End Date Taking? Authorizing Provider  albuterol (PROVENTIL HFA;VENTOLIN HFA) 108 (90 Base) MCG/ACT inhaler Inhale 2 puffs into the lungs every 6 (six) hours as needed for wheezing or shortness of breath. 12/15/17   Merrily Brittleifenbark, Vershawn Westrup, MD  aspirin 81 MG tablet Take 81 mg by mouth daily. Take 162 mg daily.    [provider]  atorvastatin (LIPITOR) 40 MG tablet Take 1 tablet by mouth daily. 08/10/15   [provider]  baclofen (LIORESAL) 10 MG tablet Take 1 tablet (10 mg total) by mouth 2 (two) times daily. 07/02/17 07/02/18  Fisher, Roselyn BeringSusan W, PA-C  colchicine 0.6 MG tablet Take 0.6 mg by mouth daily. 07/21/15   [provider]  diclofenac sodium (VOLTAREN) 1 % GEL Apply 2  g topically 4 (four) times daily. 08/22/16   Enid Derry, PA-C  gabapentin (NEURONTIN) 300 MG capsule Take 300 mg by mouth at bedtime.    [provider]  metoprolol succinate (TOPROL-XL) 25 MG 24 hr tablet Take 1 tablet by mouth daily. 07/26/15   [provider]  nitroGLYCERIN (NITROSTAT) 0.4 MG SL tablet Place 0.4 mg under the tongue every 5 (five) minutes as needed.  07/08/15   [provider]  predniSONE (STERAPRED UNI-PAK 21 TAB) 10 MG (21) TBPK tablet Take 6 pills on day one then decrease by 1 pill each day 07/02/17   Faythe Ghee, PA-C  Spacer/Aero Chamber Mouthpiece MISC 1 Units by Does not apply route every 4 (four) hours as needed (wheezing). 12/15/17   Merrily Brittle, MD  traZODone  (DESYREL) 100 MG tablet Take 1.5 tablets by mouth daily. 08/19/15   [provider]    Allergies Erythromycin  Family History  Problem Relation Age of Onset  . Heart disease Father   . Heart attack Father     Social History Social History   Tobacco Use  . Smoking status: Former Smoker    Packs/day: 1.00    Years: 30.00    Pack years: 30.00    Types: Cigarettes    Last attempt to quit: 06/28/2003    Years since quitting: 14.4  . Smokeless tobacco: Never Used  Substance Use Topics  . Alcohol use: No  . Drug use: No    Review of Systems Constitutional: Positive for fevers Eyes: No visual changes. ENT: No sore throat. Cardiovascular: Positive for chest pain. Respiratory: Positive for shortness of breath. Gastrointestinal: No abdominal pain.  No nausea, no vomiting.  No diarrhea.  No constipation. Genitourinary: Negative for dysuria. Musculoskeletal: Negative for back pain. Skin: Negative for rash. Neurological: Negative for headaches, focal weakness or numbness.   ____________________________________________   PHYSICAL EXAM:  VITAL SIGNS: ED Triage Vitals  Enc Vitals Group     BP 12/15/17 0638 140/76     Pulse Rate 12/15/17 0638 66     Resp 12/15/17 0638 20     Temp 12/15/17 0638 98 F (36.7 C)     Temp Source 12/15/17 0638 Oral     SpO2 12/15/17 0638 96 %     Weight 12/15/17 0635 300 lb (136.1 kg)     Height 12/15/17 0635 5\' 6"  (1.676 m)     Head Circumference --      Peak Flow --      Pain Score 12/15/17 0634 0     Pain Loc --      Pain Edu? --      Excl. in GC? --     Constitutional: Alert and oriented x4 morbidly obese audible wheeze when talking Eyes: PERRL EOMI. Head: Atraumatic. Nose: No congestion/rhinnorhea. Mouth/Throat: No trismus Neck: No stridor.   Cardiovascular: Normal rate, regular rhythm. Grossly normal heart sounds.  Good peripheral circulation. Respiratory: Slightly increased respiratory effort with diffuse expiratory  wheeze in all fields Gastrointestinal: Morbidly obese soft nontender Musculoskeletal: Legs are equal in size with 2+ pitting edema to upper shins bilaterally Neurologic:  Normal speech and language. No gross focal neurologic deficits are appreciated. Skin:  Skin is warm, dry and intact. No rash noted. Psychiatric: Mood and affect are normal. Speech and behavior are normal.    ____________________________________________   DIFFERENTIAL includes but not limited to  Pneumonia, bronchitis, influenza, CHF exacerbation, pulmonary embolism ____________________________________________   LABS (all labs ordered are listed, but  only abnormal results are displayed)  Labs Reviewed  CBC WITH DIFFERENTIAL/PLATELET - Abnormal; Notable for the following components:      Result Value   RBC 3.96 (*)    Hemoglobin 12.2 (*)    HCT 37.3 (*)    RDW 14.8 (*)    Lymphs Abs 0.8 (*)    All other components within normal limits  COMPREHENSIVE METABOLIC PANEL - Abnormal; Notable for the following components:   Glucose, Bld 140 (*)    BUN 27 (*)    Creatinine, Ser 1.25 (*)    Calcium 8.3 (*)    AST 43 (*)    GFR calc non Af Amer 58 (*)    All other components within normal limits  INFLUENZA PANEL BY PCR (TYPE A & B) - Abnormal; Notable for the following components:   Influenza A By PCR POSITIVE (*)    All other components within normal limits  TROPONIN I  BRAIN NATRIURETIC PEPTIDE    Lab work reviewed by me shows the patient is flu positive __________________________________________  EKG  ED ECG REPORT I, Merrily Brittle, the attending physician, personally viewed and interpreted this ECG.  Date: 12/15/2017 EKG Time:  Rate: 61 Rhythm: normal sinus rhythm QRS Axis: normal Intervals: normal ST/T Wave abnormalities: normal Narrative Interpretation: no evidence of acute ischemia  ____________________________________________  RADIOLOGY  Chest x-ray reviewed by me with chronic cardiomegaly  but no acute disease ____________________________________________   PROCEDURES  Procedure(s) performed: no  Procedures  Critical Care performed: no  Observation: no ____________________________________________   INITIAL IMPRESSION / ASSESSMENT AND PLAN / ED COURSE  Pertinent labs & imaging results that were available during my care of the patient were reviewed by me and considered in my medical decision making (see chart for details).       ----------------------------------------- 7:56 AM on 12/15/2017 -----------------------------------------  The patient arrives with diffuse wheezes in all fields.  Differential includes pulmonary edema from CHF versus bronchitis.  Will give a trial of albuterol now. ____________________________________________   ----------------------------------------- 8:37 AM on 12/15/2017 -----------------------------------------  After 5 mg of albuterol the patient feels significantly improved and his lungs are much less wheezy.  Chest x-ray with no infiltrate.  He likely has viral bronchitis.  Influenza testing is pending as is another breathing treatment.   After the patient's second breathing treatment his symptoms are nearly completely resolved.  He is influenza A positive.  His symptoms began a week ago and we discussed that he is outside the window of treatment for Tamiflu.  I will treat him symptomatically with albuterol.  He is able to get up and ambulate without desaturation.  He is discharged home in improved condition verbalizes understanding and agreement with the plan.  Strict return precautions have been discussed.  FINAL CLINICAL IMPRESSION(S) / ED DIAGNOSES  Final diagnoses:  Influenza A  Viral bronchitis      NEW MEDICATIONS STARTED DURING THIS VISIT:  New Prescriptions   ALBUTEROL (PROVENTIL HFA;VENTOLIN HFA) 108 (90 BASE) MCG/ACT INHALER    Inhale 2 puffs into the lungs every 6 (six) hours as needed for wheezing or  shortness of breath.   SPACER/AERO CHAMBER MOUTHPIECE MISC    1 Units by Does not apply route every 4 (four) hours as needed (wheezing).     Note:  This document was prepared using Dragon voice recognition software and may include unintentional dictation errors.     Merrily Brittle, MD 12/15/17 1135

## 2018-01-03 DIAGNOSIS — G2581 Restless legs syndrome: Secondary | ICD-10-CM | POA: Insufficient documentation

## 2018-03-26 ENCOUNTER — Emergency Department
Admission: EM | Admit: 2018-03-26 | Discharge: 2018-03-26 | Disposition: A | Discharge status: Home / Self Care | Payer: Medicare Other | Attending: Student in an Organized Health Care Education/Training Program | Admitting: Student in an Organized Health Care Education/Training Program

## 2018-03-26 ENCOUNTER — Emergency Department: Payer: Medicare Other

## 2018-03-26 ENCOUNTER — Encounter: Payer: Self-pay | Admitting: Emergency Medicine

## 2018-03-26 ENCOUNTER — Other Ambulatory Visit: Payer: Self-pay

## 2018-03-26 DIAGNOSIS — M545 Low back pain: Secondary | ICD-10-CM | POA: Diagnosis present

## 2018-03-26 DIAGNOSIS — I110 Hypertensive heart disease with heart failure: Secondary | ICD-10-CM | POA: Diagnosis not present

## 2018-03-26 DIAGNOSIS — I2510 Atherosclerotic heart disease of native coronary artery without angina pectoris: Secondary | ICD-10-CM | POA: Insufficient documentation

## 2018-03-26 DIAGNOSIS — J449 Chronic obstructive pulmonary disease, unspecified: Secondary | ICD-10-CM | POA: Insufficient documentation

## 2018-03-26 DIAGNOSIS — Z9049 Acquired absence of other specified parts of digestive tract: Secondary | ICD-10-CM | POA: Insufficient documentation

## 2018-03-26 DIAGNOSIS — Z9884 Bariatric surgery status: Secondary | ICD-10-CM | POA: Diagnosis not present

## 2018-03-26 DIAGNOSIS — Z79899 Other long term (current) drug therapy: Secondary | ICD-10-CM | POA: Insufficient documentation

## 2018-03-26 DIAGNOSIS — Z87891 Personal history of nicotine dependence: Secondary | ICD-10-CM | POA: Diagnosis not present

## 2018-03-26 DIAGNOSIS — Z7982 Long term (current) use of aspirin: Secondary | ICD-10-CM | POA: Insufficient documentation

## 2018-03-26 DIAGNOSIS — M5432 Sciatica, left side: Secondary | ICD-10-CM | POA: Diagnosis not present

## 2018-03-26 DIAGNOSIS — E119 Type 2 diabetes mellitus without complications: Secondary | ICD-10-CM | POA: Diagnosis not present

## 2018-03-26 DIAGNOSIS — I5032 Chronic diastolic (congestive) heart failure: Secondary | ICD-10-CM | POA: Diagnosis not present

## 2018-03-26 DIAGNOSIS — I700 Atherosclerosis of aorta: Secondary | ICD-10-CM

## 2018-03-26 MED ORDER — PREDNISONE 10 MG PO TABS: ORAL_TABLET | ORAL | 0 refills | Status: DC

## 2018-03-26 MED ORDER — METHYLPREDNISOLONE SODIUM SUCC 125 MG IJ SOLR
80.0000 mg | Freq: Once | INTRAMUSCULAR | Status: AC
  Administered 2018-03-26: 80 mg via INTRAMUSCULAR
  Filled 2018-03-26: qty 2

## 2018-03-26 NOTE — ED Triage Notes (Signed)
Pain ;low back and L leg since bending over yesterday.

## 2018-03-26 NOTE — ED Notes (Signed)
States felt something pop yesterday to left hip/ top of left leg while bending down.  States pain shoots down left leg.

## 2018-03-26 NOTE — ED Provider Notes (Signed)
[facility] Emergency Department Provider Note  ____________________________________________  Time seen: Approximately 1:39 PM  I have reviewed the triage vital signs and the nursing notes.   HISTORY  Chief Complaint Back Pain    HPI Aaron Ball is a 67 y.o. male that presents to the emergency department for evaluation of left low back pain for 1 week.  He was bending down yesterday when pain worsened. Pain starts in his left buttocks and radiates to the back of his upper left leg.  It is more painful with standing and sitting for a long time.  No bowel or bladder dysfunction or saddle paresthesias.  He has been taking Meloxicam and Flexeril that have not been helping.  He has had a cortisone injection in the past, which has helped. No IV drug use.  No fever, chills, leg swelling, calf pain.     Past Medical History:  Diagnosis Date  . A-fib (HCC) 04/2011   a. s/p ablation x 2 w/o evi of recurrence   . Chronic diastolic CHF (congestive heart failure) (HCC)    a. echo 2012: EF >55%, nl RVSP  . COPD (chronic obstructive pulmonary disease) (HCC)   . Coronary artery disease    a. s/p stent in RCA in 1997 w/ ISR 2003 & 05/2013 s/p DES to mid RCA 05/2013; b. cardiac cath 02/2014: nonobstructive CAD with patent stent in RCA with 40% ISR, no evi of pulm HTN, nl LVEDP, EF >65%   . Diabetes mellitus (HCC)   . GERD (gastroesophageal reflux disease)   . Gout   . Hyperlipidemia   . Hypertension   . Morbid obesity (HCC)   . Obesity hypoventilation syndrome (HCC)   . OSA (obstructive sleep apnea)   . Wears hearing aid    right ear    Patient Active Problem List   Diagnosis Date Noted  . Chest pain, central 10/08/2015  . CAD (coronary artery disease) 06/28/2011  . Obesity 06/28/2011  . Hyperlipidemia 06/28/2011  . HTN (hypertension) 06/28/2011    Past Surgical History:  Procedure Laterality Date  . ATRIAL ABLATION SURGERY     A-Fib  . BARIATRIC SURGERY     . CARDIAC CATHETERIZATION     s/p stent to RCA.   [name]. CARDIAC CATHETERIZATION  2008   Dr[facility] Juliann [facility]wood [facility] .  [name]. CHOLECYSTECTOMY  2012  . heart stint  2013  . HIP FRACTURE SURGERY    . STOMACH SURGERY     gastric sleeve    Prior to Admission medications   Medication Sig Start Date End Date Taking? Authorizing Provider  albuterol (PROVENTIL HFA;VENTOLIN HFA) 108 (90 Base) MCG/ACT inhaler Inhale 2 puffs into the lungs every 6 (six) hours as needed for wheezing or shortness of breath. 12/15/17   Merrily Brittleifenbark, Neil, MD  aspirin 81 MG tablet Take 81 mg by mouth daily. Take 162 mg daily.    [provider]  atorvastatin (LIPITOR) 40 MG tablet Take 1 tablet by mouth daily. 08/10/15   [provider]  baclofen (LIORESAL) 10 MG tablet Take 1 tablet (10 mg total) by mouth 2 (two) times daily. 07/02/17 07/02/18  Fisher, Roselyn BeringSusan W, PA-C  colchicine 0.6 MG tablet Take 0.6 mg by mouth daily. 07/21/15   [provider]  diclofenac sodium (VOLTAREN) 1 % GEL Apply 2 g topically 4 (four) times daily. 08/22/16   Enid DerryWagner, Blakley Michna, PA-C  gabapentin (NEURONTIN) 300 MG capsule Take 300 mg by mouth at bedtime.    [provider]  metoprolol succinate (TOPROL-XL) 25 MG 24 hr tablet Take 1 tablet by mouth daily. 07/26/15   [provider]  nitroGLYCERIN (NITROSTAT) 0.4 MG SL tablet Place 0.4 mg under the tongue every 5 (five) minutes as needed.  07/08/15   [provider]  predniSONE (DELTASONE) 10 MG tablet Take 6 tablets on day 1, take 5 tablets on day 2, take 4 tablets on day 3, take 3 tablets on day 4, take 2 tablets on day 5, take 1 tablet on day 6 03/26/18   Enid Derry, PA-C  Spacer/Aero Chamber Mouthpiece MISC 1 Units by Does not apply route every 4 (four) hours as needed (wheezing). 12/15/17   Merrily Brittle, MD  traZODone (DESYREL) 100 MG tablet Take 1.5 tablets by mouth daily. 08/19/15   [provider]    Allergies Erythromycin  Family History   Problem Relation Age of Onset  . Heart disease Father   . Heart attack Father     Social History Social History   Tobacco Use  . Smoking status: Former Smoker    Packs/day: 1.00    Years: 30.00    Pack years: 30.00    Types: Cigarettes    Last attempt to quit: 06/28/2003    Years since quitting: 14.7  . Smokeless tobacco: Never Used  Substance Use Topics  . Alcohol use: No  . Drug use: No     Review of Systems  Constitutional: No fever/chills Cardiovascular: No chest pain. Respiratory: No SOB. Gastrointestinal: No abdominal pain.  No nausea, no vomiting.  Musculoskeletal: Positive for left back pain.  Skin: Negative for rash, abrasions, lacerations, ecchymosis. Neurological: Negative for numbness or tingling   ____________________________________________   PHYSICAL EXAM:  VITAL SIGNS: ED Triage Vitals  Enc Vitals Group     BP 03/26/18 1224 (!) 109/51     Pulse Rate 03/26/18 1224 65     Resp 03/26/18 1224 18     Temp 03/26/18 1224 98.1 F (36.7 C)     Temp Source 03/26/18 1224 Oral     SpO2 03/26/18 1224 97 %     Weight 03/26/18 1225 (!) 320 lb (145.2 kg)     Height 03/26/18 1225 5\' 6"  (1.676 m)     Head Circumference --      Peak Flow --      Pain Score 03/26/18 1225 10     Pain Loc --      Pain Edu? --      Excl. in GC? --      Constitutional: Alert and oriented. Well appearing and in no acute distress. Eyes: Conjunctivae are normal. PERRL. EOMI. Head: Atraumatic. ENT:      Ears:      Nose: No congestion/rhinnorhea.      Mouth/Throat: Mucous membranes are moist.  Neck: No stridor.   Cardiovascular: Normal rate, regular rhythm.  Good peripheral circulation. Respiratory: Normal respiratory effort without tachypnea or retractions. Lungs CTAB. Good air entry to the bases with no decreased or absent breath sounds. Gastrointestinal: Bowel sounds 4 quadrants. Soft and nontender to palpation. No guarding or rigidity. No palpable masses. No  distention. Musculoskeletal: Full range of motion to all extremities. No gross deformities appreciated. No tenderness to palpation of lumbar spine or hip. Tenderness to palpation over left buttocks.  Positive straight leg raise.  Full range of motion of left hip.  Strength equal in lower extremities bilaterally. Neurologic:  Normal speech and language. No gross focal neurologic deficits are appreciated.  Skin:  Skin is warm, dry and intact. No rash noted. Psychiatric: Mood and affect are normal. Speech and behavior are normal. Patient exhibits appropriate insight and judgement.   ____________________________________________   LABS (all labs ordered are listed, but only abnormal results are displayed)  Labs Reviewed - No data to display ____________________________________________  EKG   ____________________________________________  RADIOLOGY Lexine Baton, personally viewed and evaluated these images (plain radiographs) as part of my medical decision making, as well as reviewing the written report by the radiologist.  Dg Lumbar Spine Complete  Result Date: 03/26/2018 CLINICAL DATA:  67 year old male with lumbar back pain radiating to the left leg since bending yesterday. EXAM: LUMBAR SPINE - COMPLETE 4+ VIEW COMPARISON:  Lumbar radiographs 07/02/2017. CT Abdomen and Pelvis 01/31/2012. FINDINGS: Stable cholecystectomy clips. Chronic right proximal femur ORIF hardware. Normal lumbar segmentation. Stable vertebral height and alignment since 2018. Stable disc spaces and widespread endplate spurring. No pars fracture. There is chronic mild to moderate lower lumbar facet hypertrophy. The visible lower thoracic levels appear intact. The sacral ala and SI joints appear stable and negative. No acute osseous abnormality identified. Negative visible bowel gas pattern. Calcified aortic atherosclerosis. IMPRESSION: 1.  No acute osseous abnormality identified in the lumbar spine. 2.  Aortic  Atherosclerosis (ICD10-I70.0). Electronically Signed   By: Odessa Fleming M.D.   On: 03/26/2018 14:29    ____________________________________________    PROCEDURES  Procedure(s) performed:    Procedures    Medications  methylPREDNISolone sodium succinate (SOLU-MEDROL) 125 mg/2 mL injection 80 mg (80 mg Intramuscular Given 03/26/18 1514)     ____________________________________________   INITIAL IMPRESSION / ASSESSMENT AND PLAN / ED COURSE  Pertinent labs & imaging results that were available during my care of the patient were reviewed by me and considered in my medical decision making (see chart for details).  Review of the [place] CSRS was performed in accordance of the [facility] prior to dispensing any controlled drugs.   Patient's diagnosis is consistent with sciatica.  Vital signs and exam are reassuring.  Lumbar spine x-ray negative for acute bony ab normalities.  All findings were discussed with patient.  IM Solu-Medrol was given. He will continue flexeril and meloxicam.  Patient will be discharged home with prescriptions for prednisone. Patient is to follow up with ortho as directed. Patient is given ED precautions to return to the ED for any worsening or new symptoms.     ____________________________________________  FINAL CLINICAL IMPRESSION(S) / ED DIAGNOSES  Final diagnoses:  Sciatica of left side  Aortic atherosclerosis (HCC)      NEW MEDICATIONS STARTED DURING THIS VISIT:  ED Discharge Orders        Ordered    predniSONE (DELTASONE) 10 MG tablet     03/26/18 1536          This chart was dictated using voice recognition software/Dragon. Despite best efforts to proofread, errors can occur which can change the meaning. Any change was purely unintentional.    Enid Derry, PA-C 03/26/18 1619    Willy Eddy, MD 03/26/18 [phone]

## 2018-04-09 ENCOUNTER — Emergency Department
Admission: EM | Admit: 2018-04-09 | Discharge: 2018-04-09 | Disposition: A | Discharge status: Home / Self Care | Payer: Medicare Other | Attending: Emergency Medicine | Admitting: Emergency Medicine

## 2018-04-09 ENCOUNTER — Emergency Department: Payer: Medicare Other

## 2018-04-09 ENCOUNTER — Other Ambulatory Visit: Payer: Self-pay

## 2018-04-09 DIAGNOSIS — I2510 Atherosclerotic heart disease of native coronary artery without angina pectoris: Secondary | ICD-10-CM | POA: Diagnosis not present

## 2018-04-09 DIAGNOSIS — E119 Type 2 diabetes mellitus without complications: Secondary | ICD-10-CM | POA: Insufficient documentation

## 2018-04-09 DIAGNOSIS — I110 Hypertensive heart disease with heart failure: Secondary | ICD-10-CM | POA: Diagnosis not present

## 2018-04-09 DIAGNOSIS — S79821A Other specified injuries of right thigh, initial encounter: Secondary | ICD-10-CM | POA: Diagnosis present

## 2018-04-09 DIAGNOSIS — Z7982 Long term (current) use of aspirin: Secondary | ICD-10-CM | POA: Diagnosis not present

## 2018-04-09 DIAGNOSIS — S76311A Strain of muscle, fascia and tendon of the posterior muscle group at thigh level, right thigh, initial encounter: Secondary | ICD-10-CM | POA: Insufficient documentation

## 2018-04-09 DIAGNOSIS — Y999 Unspecified external cause status: Secondary | ICD-10-CM | POA: Insufficient documentation

## 2018-04-09 DIAGNOSIS — W010XXA Fall on same level from slipping, tripping and stumbling without subsequent striking against object, initial encounter: Secondary | ICD-10-CM | POA: Insufficient documentation

## 2018-04-09 DIAGNOSIS — I5032 Chronic diastolic (congestive) heart failure: Secondary | ICD-10-CM | POA: Diagnosis not present

## 2018-04-09 DIAGNOSIS — Z87891 Personal history of nicotine dependence: Secondary | ICD-10-CM | POA: Diagnosis not present

## 2018-04-09 DIAGNOSIS — Y929 Unspecified place or not applicable: Secondary | ICD-10-CM | POA: Diagnosis not present

## 2018-04-09 DIAGNOSIS — Z79899 Other long term (current) drug therapy: Secondary | ICD-10-CM | POA: Insufficient documentation

## 2018-04-09 DIAGNOSIS — Y9389 Activity, other specified: Secondary | ICD-10-CM | POA: Diagnosis not present

## 2018-04-09 MED ORDER — OXYCODONE-ACETAMINOPHEN 5-325 MG PO TABS
2.0000 | ORAL_TABLET | ORAL | Status: AC
  Administered 2018-04-09: 2 via ORAL
  Filled 2018-04-09: qty 2

## 2018-04-09 MED ORDER — OXYCODONE-ACETAMINOPHEN 5-325 MG PO TABS: 1.0000 | ORAL_TABLET | Freq: Four times a day (QID) | ORAL | 0 refills | Status: DC | PRN

## 2018-04-09 NOTE — ED Triage Notes (Signed)
Pt had mechanical fall while mopping the kitchen. Right leg went forward and left leg went backwards. Did not hit head. No LOC. C/o right hamstring and upper back pain since the fall.

## 2018-04-09 NOTE — ED Provider Notes (Signed)
[facility] Emergency Department Provider Note   ____________________________________________   First MD Initiated Contact with Patient 04/09/18 1551     (approximate)  I have reviewed the triage vital signs and the nursing notes.   HISTORY  Chief Complaint Fall    HPI Aaron HammanWilliam Toney Ball is a 67 y.o. male with a history of A. fib COPD obesity diabetes  Patient was mopping in his house he reports while doing so he stepped over and is feet slipped on the wet floor and he essentially "try to do the splits".  He had immediate pain behind the right thigh.  Reports he thinks he may have torn 1 of his muscles behind the leg.  He had to have EMS come to assist him up, and due to ongoing pain need to come to the ER because he is not able to move the leg around well because of pain.  He was not able to walk on his own at the house due to pain.  Does not think he hurt his hip, denies pain in the knee or other aspects of the right foot but reports all the pain is behind the right thigh muscle, is pretty comfortable sitting still but whenever he moves it feels like a sharp achiness behind the leg.  Did not strike his head.  Denies neck or back pain.  Reports he really just thinks he tore the muscle behind the right leg.  No numbness tingling or cool foot.  No fevers or chills.  Denies recent medical issues except he does have some back pain a week ago but that is gotten better with treatment.      Past Medical History:  Diagnosis Date  . A-fib (HCC) 04/2011   a. s/p ablation x 2 w/o evi of recurrence   . Chronic diastolic CHF (congestive heart failure) (HCC)    a. echo 2012: EF >55%, nl RVSP  . COPD (chronic obstructive pulmonary disease) (HCC)   . Coronary artery disease    a. s/p stent in RCA in 1997 w/ ISR 2003 & 05/2013 s/p DES to mid RCA 05/2013; b. cardiac cath 02/2014: nonobstructive CAD with patent stent in RCA with 40% ISR, no evi of pulm HTN, nl LVEDP, EF >65%    . Diabetes mellitus (HCC)   . GERD (gastroesophageal reflux disease)   . Gout   . Hyperlipidemia   . Hypertension   . Morbid obesity (HCC)   . Obesity hypoventilation syndrome (HCC)   . OSA (obstructive sleep apnea)   . Wears hearing aid    right ear    Patient Active Problem List   Diagnosis Date Noted  . Chest pain, central 10/08/2015  . CAD (coronary artery disease) 06/28/2011  . Obesity 06/28/2011  . Hyperlipidemia 06/28/2011  . HTN (hypertension) 06/28/2011    Past Surgical History:  Procedure Laterality Date  . ATRIAL ABLATION SURGERY     A-Fib  . BARIATRIC SURGERY    . CARDIAC CATHETERIZATION     s/p stent to RCA.   [name]. CARDIAC CATHETERIZATION  2008   Dr[facility] .  [name]. CHOLECYSTECTOMY  2012  . heart stint  2013  . HIP FRACTURE SURGERY    . STOMACH SURGERY     gastric sleeve    Prior to Admission medications   Medication Sig Start Date End Date Taking? Authorizing Provider  albuterol (PROVENTIL HFA;VENTOLIN HFA) 108 (90 Base) MCG/ACT inhaler Inhale 2 puffs into the lungs every 6 (six)  hours as needed for wheezing or shortness of breath. 12/15/17   Merrily Brittle, MD  aspirin 81 MG tablet Take 81 mg by mouth daily. Take 162 mg daily.    [provider]  atorvastatin (LIPITOR) 40 MG tablet Take 1 tablet by mouth daily. 08/10/15   [provider]  baclofen (LIORESAL) 10 MG tablet Take 1 tablet (10 mg total) by mouth 2 (two) times daily. 07/02/17 07/02/18  Fisher, Roselyn Bering, PA-C  colchicine 0.6 MG tablet Take 0.6 mg by mouth daily. 07/21/15   [provider]  diclofenac sodium (VOLTAREN) 1 % GEL Apply 2 g topically 4 (four) times daily. 08/22/16   Enid Derry, PA-C  gabapentin (NEURONTIN) 300 MG capsule Take 300 mg by mouth at bedtime.    [provider]  metoprolol succinate (TOPROL-XL) 25 MG 24 hr tablet Take 1 tablet by mouth daily. 07/26/15   [provider]  nitroGLYCERIN (NITROSTAT) 0.4 MG SL tablet Place 0.4  mg under the tongue every 5 (five) minutes as needed.  07/08/15   [provider]  oxyCODONE-acetaminophen (PERCOCET/ROXICET) 5-325 MG tablet Take 1-2 tablets by mouth every 6 (six) hours as needed for severe pain. 04/09/18   Sharyn Creamer, MD  predniSONE (DELTASONE) 10 MG tablet Take 6 tablets on day 1, take 5 tablets on day 2, take 4 tablets on day 3, take 3 tablets on day 4, take 2 tablets on day 5, take 1 tablet on day 6 03/26/18   Enid Derry, PA-C  Spacer/Aero Chamber Mouthpiece MISC 1 Units by Does not apply route every 4 (four) hours as needed (wheezing). 12/15/17   Merrily Brittle, MD  traZODone (DESYREL) 100 MG tablet Take 1.5 tablets by mouth daily. 08/19/15   [provider]    Allergies Erythromycin  Family History  Problem Relation Age of Onset  . Heart disease Father   . Heart attack Father     Social History Social History   Tobacco Use  . Smoking status: Former Smoker    Packs/day: 1.00    Years: 30.00    Pack years: 30.00    Types: Cigarettes    Last attempt to quit: 06/28/2003    Years since quitting: 14.7  . Smokeless tobacco: Never Used  Substance Use Topics  . Alcohol use: No  . Drug use: No    Review of Systems Constitutional: No fever/chills Eyes: No visual changes. ENT: No sore throat. Cardiovascular: Denies chest pain. Respiratory: Denies shortness of breath. Gastrointestinal: No abdominal pain.   Musculoskeletal: Negative for back pain today, but was having back pain about a week ago better with treatment.  See HPI. Skin: Negative for rash.  Has not noticed any swelling Neurological: Negative for headaches, focal weakness or numbness.    ____________________________________________   PHYSICAL EXAM:  VITAL SIGNS: ED Triage Vitals  Enc Vitals Group     BP 04/09/18 1534 (!) 150/75     Pulse Rate 04/09/18 1534 69     Resp 04/09/18 1534 20     Temp 04/09/18 1534 97.6 F (36.4 C)     Temp Source 04/09/18 1534 Oral     SpO2  04/09/18 1534 100 %     Weight 04/09/18 1529 (!) 320 lb (145.2 kg)     Height 04/09/18 1529 5\' 6"  (1.676 m)     Head Circumference --      Peak Flow --      Pain Score 04/09/18 1528 8     Pain Loc --  Pain Edu? --      Excl. in GC? --     Constitutional: Alert and oriented. Well appearing and in no acute distress.  Very pleasant.  He is quite obese. Eyes: Conjunctivae are normal. Head: Atraumatic. Nose: No congestion/rhinnorhea. Mouth/Throat: Mucous membranes are moist. Neck: No stridor.   Cardiovascular: Normal rate, regular rhythm. Grossly normal heart sounds.  Good peripheral circulation. Respiratory: Normal respiratory effort.  No retractions. Lungs CTAB. Gastrointestinal: Soft and nontender. No distention. Musculoskeletal:   RIGHT Right upper extremity demonstrates normal strength, good use of all muscles. No edema bruising or contusions of the right shoulder/upper arm, right elbow, right forearm / hand. Full range of motion of the right right upper extremity without pain. No evidence of trauma. Strong radial pulse. Intact median/ulnar/radial neuro-muscular exam.  LEFT Left upper extremity demonstrates normal strength, good use of all muscles. No edema bruising or contusions of the left shoulder/upper arm, left elbow, left forearm / hand. Full range of motion of the left  upper extremity without pain. No evidence of trauma. Strong radial pulse. Intact median/ulnar/radial neuro-muscular exam.  Lower Extremities  No edema. Normal DP/PT pulses bilateral with good cap refill.  Normal neuro-motor function lower extremities bilateral.  RIGHT Right lower extremity demonstrates normal strength, good use of all muscles except pain limits his ability to flex but particularly extend the right thigh.  There is no large muscle mass or obvious defect in the hamstring, but he does have tenderness over the mid posterior hamstring region without deformity.  No edema bruising or contusions  of the right hip, right knee, right ankle.  No pain over Achilles or right calf.  Decent range of motion right lower extremity, but somewhat limited due to pain behind the right thigh.  No pain on axial loading. No evidence of trauma.  No shortening or rotation.  LEFT Left lower extremity demonstrates normal strength, good use of all muscles. No edema bruising or contusions of the hip,  knee, ankle. Full range of motion of the left lower extremity without pain. No pain on axial loading. No evidence of trauma.   Neurologic:  Normal speech and language. No gross focal neurologic deficits are appreciated.  Skin:  Skin is warm, dry and intact. No rash noted. Psychiatric: Mood and affect are normal. Speech and behavior are normal.  ____________________________________________   LABS (all labs ordered are listed, but only abnormal results are displayed)  Labs Reviewed - No data to display ____________________________________________  EKG   ____________________________________________  RADIOLOGY    X-rays reviewed, negative for acute. ____________________________________________   PROCEDURES  Procedure(s) performed: None  Procedures  Critical Care performed: No  ____________________________________________   INITIAL IMPRESSION / ASSESSMENT AND PLAN / ED COURSE  Pertinent labs & imaging results that were available during my care of the patient were reviewed by me and considered in my medical decision making (see chart for details).  Mechanical fall.  No head neck or abdominal/torso injury.  Reports she slipped, clinical exam seem to suggest likely a hamstring muscle tear that is likely incomplete.  No evidence of a complete tear to noted by clinical exam.  Will obtain films to assure no underlying fracture or hip injury, though the patient really does not report pain in that area he reports pain over the hamstring primarily.  Provide Percocet for pain relief.  No preceding  symptoms such as neurologic cardiac or pulmonary symptoms.  He is very clear that he was mopping and slipped on the floor causing  him to fall the injury.    ----------------------------------------- 6:11 PM on 04/09/2018 -----------------------------------------  After pain control, patient is able to use a walker and walk well.  I suspect a hamstring strain is the likely cause of his pain given the location and symptoms.  Discussed with the patient his family, he will follow-up with orthopedics and his primary care doctor.  Return precautions discussed, also patient will not be driving himself.  I will prescribe the patient a narcotic pain medicine due to their condition which I anticipate will cause at least moderate pain short term. I discussed with the patient safe use of narcotic pain medicines, and that they are not to drive, work in dangerous areas, or ever take more than prescribed (no more than 1 pill every 6 hours). We discussed that this is the type of medication that can be  overdosed on and the risks of this type of medicine. Patient is very agreeable to only use as prescribed and to never use more than prescribed.  Return precautions and treatment recommendations and follow-up discussed with the patient who is agreeable with the plan.   ____________________________________________   FINAL CLINICAL IMPRESSION(S) / ED DIAGNOSES  Final diagnoses:  Partial hamstring tear, right, initial encounter      NEW MEDICATIONS STARTED DURING THIS VISIT:  New Prescriptions   OXYCODONE-ACETAMINOPHEN (PERCOCET/ROXICET) 5-325 MG TABLET    Take 1-2 tablets by mouth every 6 (six) hours as needed for severe pain.     Note:  This document was prepared using Dragon voice recognition software and may include unintentional dictation errors.     Sharyn Creamer, MD 04/09/18 [phone]

## 2018-04-09 NOTE — Discharge Instructions (Signed)
No driving today or while taking oxycodone.  °

## 2018-04-09 NOTE — ED Notes (Signed)
Pt was able to ambulate with the walker, no difficulty.

## 2018-04-12 DIAGNOSIS — S76319A Strain of muscle, fascia and tendon of the posterior muscle group at thigh level, unspecified thigh, initial encounter: Secondary | ICD-10-CM | POA: Insufficient documentation

## 2018-05-22 DIAGNOSIS — Z9181 History of falling: Secondary | ICD-10-CM | POA: Insufficient documentation

## 2018-09-02 ENCOUNTER — Other Ambulatory Visit: Payer: Self-pay

## 2018-09-02 ENCOUNTER — Emergency Department: Payer: No Typology Code available for payment source

## 2018-09-02 ENCOUNTER — Emergency Department
Admission: EM | Admit: 2018-09-02 | Discharge: 2018-09-02 | Disposition: A | Discharge status: Home / Self Care | Payer: No Typology Code available for payment source | Attending: Emergency Medicine | Admitting: Emergency Medicine

## 2018-09-02 DIAGNOSIS — M25572 Pain in left ankle and joints of left foot: Secondary | ICD-10-CM | POA: Diagnosis not present

## 2018-09-02 DIAGNOSIS — Z79899 Other long term (current) drug therapy: Secondary | ICD-10-CM | POA: Diagnosis not present

## 2018-09-02 DIAGNOSIS — J449 Chronic obstructive pulmonary disease, unspecified: Secondary | ICD-10-CM | POA: Insufficient documentation

## 2018-09-02 DIAGNOSIS — E119 Type 2 diabetes mellitus without complications: Secondary | ICD-10-CM | POA: Diagnosis not present

## 2018-09-02 DIAGNOSIS — Z87891 Personal history of nicotine dependence: Secondary | ICD-10-CM | POA: Insufficient documentation

## 2018-09-02 DIAGNOSIS — Z9884 Bariatric surgery status: Secondary | ICD-10-CM | POA: Insufficient documentation

## 2018-09-02 DIAGNOSIS — M542 Cervicalgia: Secondary | ICD-10-CM | POA: Insufficient documentation

## 2018-09-02 DIAGNOSIS — I5032 Chronic diastolic (congestive) heart failure: Secondary | ICD-10-CM | POA: Insufficient documentation

## 2018-09-02 DIAGNOSIS — I2510 Atherosclerotic heart disease of native coronary artery without angina pectoris: Secondary | ICD-10-CM | POA: Insufficient documentation

## 2018-09-02 DIAGNOSIS — Z7982 Long term (current) use of aspirin: Secondary | ICD-10-CM | POA: Insufficient documentation

## 2018-09-02 DIAGNOSIS — R51 Headache: Secondary | ICD-10-CM | POA: Diagnosis not present

## 2018-09-02 DIAGNOSIS — Z9049 Acquired absence of other specified parts of digestive tract: Secondary | ICD-10-CM | POA: Diagnosis not present

## 2018-09-02 DIAGNOSIS — I110 Hypertensive heart disease with heart failure: Secondary | ICD-10-CM | POA: Diagnosis not present

## 2018-09-02 MED ORDER — TRAMADOL HCL 50 MG PO TABS: 50.0000 mg | ORAL_TABLET | Freq: Four times a day (QID) | ORAL | 0 refills | Status: DC | PRN

## 2018-09-02 MED ORDER — CYCLOBENZAPRINE HCL 5 MG PO TABS: 5.0000 mg | ORAL_TABLET | Freq: Three times a day (TID) | ORAL | 0 refills | Status: DC | PRN
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
## 2019-04-22 ENCOUNTER — Emergency Department: Payer: Medicare Other

## 2019-04-22 ENCOUNTER — Encounter: Payer: Self-pay | Admitting: Emergency Medicine

## 2019-04-22 ENCOUNTER — Other Ambulatory Visit: Payer: Self-pay

## 2019-04-22 ENCOUNTER — Emergency Department
Admission: EM | Admit: 2019-04-22 | Discharge: 2019-04-22 | Disposition: A | Discharge status: Home / Self Care | Payer: Medicare Other | Attending: Emergency Medicine | Admitting: Emergency Medicine

## 2019-04-22 DIAGNOSIS — Z7982 Long term (current) use of aspirin: Secondary | ICD-10-CM | POA: Insufficient documentation

## 2019-04-22 DIAGNOSIS — Y939 Activity, unspecified: Secondary | ICD-10-CM | POA: Insufficient documentation

## 2019-04-22 DIAGNOSIS — S8992XA Unspecified injury of left lower leg, initial encounter: Secondary | ICD-10-CM | POA: Diagnosis present

## 2019-04-22 DIAGNOSIS — Y999 Unspecified external cause status: Secondary | ICD-10-CM | POA: Diagnosis not present

## 2019-04-22 DIAGNOSIS — Y92000 Kitchen of unspecified non-institutional (private) residence as  the place of occurrence of the external cause: Secondary | ICD-10-CM | POA: Diagnosis not present

## 2019-04-22 DIAGNOSIS — I110 Hypertensive heart disease with heart failure: Secondary | ICD-10-CM | POA: Insufficient documentation

## 2019-04-22 DIAGNOSIS — J449 Chronic obstructive pulmonary disease, unspecified: Secondary | ICD-10-CM | POA: Insufficient documentation

## 2019-04-22 DIAGNOSIS — I5032 Chronic diastolic (congestive) heart failure: Secondary | ICD-10-CM | POA: Insufficient documentation

## 2019-04-22 DIAGNOSIS — I2510 Atherosclerotic heart disease of native coronary artery without angina pectoris: Secondary | ICD-10-CM | POA: Insufficient documentation

## 2019-04-22 DIAGNOSIS — Z79899 Other long term (current) drug therapy: Secondary | ICD-10-CM | POA: Diagnosis not present

## 2019-04-22 DIAGNOSIS — S8392XA Sprain of unspecified site of left knee, initial encounter: Secondary | ICD-10-CM | POA: Diagnosis not present

## 2019-04-22 DIAGNOSIS — W010XXA Fall on same level from slipping, tripping and stumbling without subsequent striking against object, initial encounter: Secondary | ICD-10-CM | POA: Insufficient documentation

## 2019-04-22 DIAGNOSIS — W19XXXA Unspecified fall, initial encounter: Secondary | ICD-10-CM

## 2019-04-22 DIAGNOSIS — Z87891 Personal history of nicotine dependence: Secondary | ICD-10-CM | POA: Diagnosis not present

## 2019-04-22 MED ORDER — HYDROCODONE-ACETAMINOPHEN 5-325 MG PO TABS: 1.0000 | ORAL_TABLET | Freq: Three times a day (TID) | ORAL | 0 refills | Status: AC | PRN

## 2019-04-22 MED ORDER — OXYCODONE-ACETAMINOPHEN 5-325 MG PO TABS
1.0000 | ORAL_TABLET | Freq: Once | ORAL | Status: AC
  Administered 2019-04-22: 12:00:00 1 via ORAL
  Filled 2019-04-22: qty 1

## 2019-04-22 NOTE — ED Triage Notes (Signed)
Presents vis EMS  States he was walking at home and felt a "pop" to left knee   Denies any fall

## 2019-04-22 NOTE — ED Provider Notes (Signed)
[facility] Emergency Department Provider Note ____________________________________________  Time seen: 1135  I have reviewed the triage vital signs and the nursing notes.  HISTORY  Chief Complaint  Knee Pain  HPI Aaron Ball is a 68 y.o. male presents to the ED from home via EMS, for evaluation of injury sustained following a fall.  Patient describes he tripped on his kitchen floor as the floor is on level, and felt his knee buckle just before he fell.  He describes left knee pain primarily localized to the posterior lateral aspect.  He denies any other injury at this time. He uses a walker and cane to ambulate at baseline. He is also denied any head injury, loss of consciousness, chest pain, shortness breath.  Patient was able to notify EMS at the time of the fall and they present him here for further evaluation.  He took a tramadol after the fall for his pain, which is prescribed for his chronic back pain.  Past Medical History:  Diagnosis Date  . A-fib (HCC) 04/2011   a. s/p ablation x 2 w/o evi of recurrence   . Chronic diastolic CHF (congestive heart failure) (HCC)    a. echo 2012: EF >55%, nl RVSP  . COPD (chronic obstructive pulmonary disease) (HCC)   . Coronary artery disease    a. s/p stent in RCA in 1997 w/ ISR 2003 & 05/2013 s/p DES to mid RCA 05/2013; b. cardiac cath 02/2014: nonobstructive CAD with patent stent in RCA with 40% ISR, no evi of pulm HTN, nl LVEDP, EF >65%   . Diabetes mellitus (HCC)   . GERD (gastroesophageal reflux disease)   . Gout   . Hyperlipidemia   . Hypertension   . Morbid obesity (HCC)   . Obesity hypoventilation syndrome (HCC)   . OSA (obstructive sleep apnea)   . Wears hearing aid    right ear    Patient Active Problem List   Diagnosis Date Noted  . Chest pain, central 10/08/2015  . CAD (coronary artery disease) 06/28/2011  . Obesity 06/28/2011  . Hyperlipidemia 06/28/2011  . HTN (hypertension) 06/28/2011     Past Surgical History:  Procedure Laterality Date  . ATRIAL ABLATION SURGERY     A-Fib  . BARIATRIC SURGERY    . CARDIAC CATHETERIZATION     s/p stent to RCA.   [name]. CARDIAC CATHETERIZATION  2008   [facility] Julian[facility]wood [facility] .  [name]. CHOLECYSTECTOMY  2012  . heart stint  2013  . HIP FRACTURE SURGERY    . STOMACH SURGERY     gastric sleeve    Prior to Admission medications   Medication Sig Start Date End Date Taking? Authorizing Provider  albuterol (PROVENTIL HFA;VENTOLIN HFA) 108 (90 Base) MCG/ACT inhaler Inhale 2 puffs into the lungs every 6 (six) hours as needed for wheezing or shortness of breath. 12/15/17   Merrily Brittleifenbark, Neil, MD  aspirin 81 MG tablet Take 81 mg by mouth daily. Take 162 mg daily.    [provider]  atorvastatin (LIPITOR) 40 MG tablet Take 1 tablet by mouth daily. 08/10/15   [provider]  colchicine 0.6 MG tablet Take 0.6 mg by mouth daily. 07/21/15   [provider]  cyclobenzaprine (FLEXERIL) 5 MG tablet Take 1 tablet (5 mg total) by mouth 3 (three) times daily as needed for muscle spasms. 09/02/18   Minna AntisPaduchowski, Kevin, MD  diclofenac sodium (VOLTAREN) 1 % GEL Apply 2 g topically 4 (four) times daily. 08/22/16  Laban Emperor, PA-C  gabapentin (NEURONTIN) 300 MG capsule Take 300 mg by mouth at bedtime.    [provider]  HYDROcodone-acetaminophen (NORCO) 5-325 MG tablet Take 1 tablet by mouth 3 (three) times daily as needed for up to 3 days. 04/22/19 04/25/19  Preciosa Bundrick, Dannielle Karvonen, PA-C  metoprolol succinate (TOPROL-XL) 25 MG 24 hr tablet Take 1 tablet by mouth daily. 07/26/15   [provider]  nitroGLYCERIN (NITROSTAT) 0.4 MG SL tablet Place 0.4 mg under the tongue every 5 (five) minutes as needed.  07/08/15   [provider]  oxyCODONE-acetaminophen (PERCOCET/ROXICET) 5-325 MG tablet Take 1-2 tablets by mouth every 6 (six) hours as needed for severe pain. 04/09/18   Delman Kitten, MD  predniSONE (DELTASONE) 10 MG tablet  Take 6 tablets on day 1, take 5 tablets on day 2, take 4 tablets on day 3, take 3 tablets on day 4, take 2 tablets on day 5, take 1 tablet on day 6 03/26/18   Laban Emperor, PA-C  Spacer/Aero Chamber Mouthpiece MISC 1 Units by Does not apply route every 4 (four) hours as needed (wheezing). 12/15/17   Darel Hong, MD  traZODone (DESYREL) 100 MG tablet Take 1.5 tablets by mouth daily. 08/19/15   [provider]    Allergies Erythromycin  Family History  Problem Relation Age of Onset  . Heart disease Father   . Heart attack Father     Social History Social History   Tobacco Use  . Smoking status: Former Smoker    Packs/day: 1.00    Years: 30.00    Pack years: 30.00    Types: Cigarettes    Quit date: 06/28/2003    Years since quitting: 15.8  . Smokeless tobacco: Never Used  Substance Use Topics  . Alcohol use: No  . Drug use: No    Review of Systems  Constitutional: Negative for fever. Eyes: Negative for visual changes. ENT: Negative for sore throat. Cardiovascular: Negative for chest pain. Respiratory: Negative for shortness of breath. Gastrointestinal: Negative for abdominal pain, vomiting and diarrhea. Genitourinary: Negative for dysuria. Musculoskeletal: Negative for back pain.  Left knee pain as above. Skin: Negative for rash. Neurological: Negative for headaches, focal weakness or numbness. ____________________________________________  PHYSICAL EXAM:  VITAL SIGNS: ED Triage Vitals  Enc Vitals Group     BP 04/22/19 1118 (!) 122/52     Pulse Rate 04/22/19 1118 65     Resp 04/22/19 1118 20     Temp 04/22/19 1118 98.1 F (36.7 C)     Temp Source 04/22/19 1118 Oral     SpO2 04/22/19 1118 94 %     Weight 04/22/19 1120 (!) 325 lb (147.4 kg)     Height 04/22/19 1120 5\' 6"  (1.676 m)     Head Circumference --      Peak Flow --      Pain Score 04/22/19 1120 8     Pain Loc --      Pain Edu? --      Excl. in [place]? --     Constitutional: Alert and  oriented. Well appearing and in no distress. Head: Normocephalic and atraumatic. Eyes: Conjunctivae are normal. Normal extraocular movements Ears: Canals clear. TMs intact bilaterally.  Hearing aids noted bilaterally. Cardiovascular: Normal rate, regular rhythm. Normal distal pulses. Respiratory: Normal respiratory effort. No wheezes/rales/rhonchi. Gastrointestinal: Soft, protuberant, and nontender. No distention. Musculoskeletal: Left knee without any obvious deformity, effusion, or dislocation.  Patient with normal flexion and extension range noted actively.  He is mild tender palpation to the lateral posterior aspect of the knee joint.  No popliteal space fullness is noted.  No calf or Achilles tendon is noted distally.  Foot and ankle exam is benign.  Nontender with normal range of motion in all extremities.  Neurologic: Normal speech and language. No gross focal neurologic deficits are appreciated. Skin:  Skin is warm, dry and intact. No rash noted. ____________________________________________   RADIOLOGY  DG Left Knee  IMPRESSION: Joint effusion. Chondrocalcinosis of the knee joint cartilage. Patellofemoral osteoarthritis.  I, Lissa HoardJenise V Bacon-Hadassa Cermak, personally viewed and evaluated these images (plain radiographs) as part of my medical decision making, as well as reviewing the written report by the radiologist. ____________________________________________  PROCEDURES  Procedures Percocet 5-325 mg PO ____________________________________________  INITIAL IMPRESSION / ASSESSMENT AND PLAN / ED COURSE  Aaron Ball was evaluated in Emergency Department on 04/22/2019 for the symptoms described in the history of present illness. He was evaluated in the context of the global COVID-19 pandemic, which necessitated consideration that the patient might be at risk for infection with the SARS-CoV-2 virus that causes COVID-19. Institutional protocols and algorithms that pertain to the  evaluation of patients at risk for COVID-19 are in a state of rapid change based on information released by regulatory bodies including the CDC and federal and state organizations. These policies and algorithms were followed during the patient's care in the ED.   DDX: knee sprain, tibial fracture, patella tendon rupture, DVT  Patient with ED evaluation of sudden left knee pain and subsequent fall. His exam is reassuring. Lateral left knee pain concerning for knee sprain. No radiological evidence of fracture or dislocation. He is placed in a knee immobilizer and given a prescription for Norco. He is referred to Ortho for further care.   I reviewed the patient's prescription history over the last 12 months in the multi-state controlled substances database(s) that includes [place], [place], [place], [place], [place], [place], [place], [place], [place], [place], [place], [place], and [place].  Results were notable for monthly Utram prescriptions.  ____________________________________________  FINAL CLINICAL IMPRESSION(S) / ED DIAGNOSES  Final diagnoses:  Fall, initial encounter  Sprain of left knee, unspecified ligament, initial encounter      Lissa HoardMenshew, Ludie Pavlik V Bacon, PA-C 04/22/19 1409    Jene EveryKinner, Robert, MD 04/22/19 1447

## 2019-04-22 NOTE — Discharge Instructions (Signed)
Your exam and XR are consistent with a sprain to the left knee. There is no evidence of fracture or dislocation. You should wear the knee brace as needed for support. Use your walker to assist with walking. Take the prescription pain medicine as needed. Follow-up with Dr. Roland Rack or your Ortho provider for ongoing symptoms this week or next.

## 2019-09-03 ENCOUNTER — Emergency Department: Payer: Medicare Other

## 2019-09-03 ENCOUNTER — Other Ambulatory Visit: Payer: Self-pay

## 2019-09-03 ENCOUNTER — Inpatient Hospital Stay
Admission: EM | Admit: 2019-09-03 | Discharge: 2019-09-12 | DRG: 308 | Disposition: A | Discharge status: Home Health | Payer: Medicare Other | Attending: Family Medicine | Admitting: Family Medicine

## 2019-09-03 DIAGNOSIS — Z5329 Procedure and treatment not carried out because of patient's decision for other reasons: Secondary | ICD-10-CM | POA: Diagnosis not present

## 2019-09-03 DIAGNOSIS — Z8249 Family history of ischemic heart disease and other diseases of the circulatory system: Secondary | ICD-10-CM

## 2019-09-03 DIAGNOSIS — F419 Anxiety disorder, unspecified: Secondary | ICD-10-CM | POA: Diagnosis present

## 2019-09-03 DIAGNOSIS — E213 Hyperparathyroidism, unspecified: Secondary | ICD-10-CM | POA: Diagnosis present

## 2019-09-03 DIAGNOSIS — M109 Gout, unspecified: Secondary | ICD-10-CM | POA: Diagnosis present

## 2019-09-03 DIAGNOSIS — G8929 Other chronic pain: Secondary | ICD-10-CM | POA: Diagnosis present

## 2019-09-03 DIAGNOSIS — Z79891 Long term (current) use of opiate analgesic: Secondary | ICD-10-CM

## 2019-09-03 DIAGNOSIS — J9601 Acute respiratory failure with hypoxia: Secondary | ICD-10-CM | POA: Diagnosis not present

## 2019-09-03 DIAGNOSIS — T502X5A Adverse effect of carbonic-anhydrase inhibitors, benzothiadiazides and other diuretics, initial encounter: Secondary | ICD-10-CM | POA: Diagnosis present

## 2019-09-03 DIAGNOSIS — M545 Low back pain: Secondary | ICD-10-CM | POA: Diagnosis not present

## 2019-09-03 DIAGNOSIS — E662 Morbid (severe) obesity with alveolar hypoventilation: Secondary | ICD-10-CM | POA: Diagnosis present

## 2019-09-03 DIAGNOSIS — Z87891 Personal history of nicotine dependence: Secondary | ICD-10-CM

## 2019-09-03 DIAGNOSIS — I10 Essential (primary) hypertension: Secondary | ICD-10-CM | POA: Diagnosis present

## 2019-09-03 DIAGNOSIS — I441 Atrioventricular block, second degree: Secondary | ICD-10-CM | POA: Diagnosis present

## 2019-09-03 DIAGNOSIS — Z791 Long term (current) use of non-steroidal anti-inflammatories (NSAID): Secondary | ICD-10-CM

## 2019-09-03 DIAGNOSIS — I952 Hypotension due to drugs: Secondary | ICD-10-CM | POA: Diagnosis not present

## 2019-09-03 DIAGNOSIS — I110 Hypertensive heart disease with heart failure: Secondary | ICD-10-CM | POA: Diagnosis present

## 2019-09-03 DIAGNOSIS — Z23 Encounter for immunization: Secondary | ICD-10-CM

## 2019-09-03 DIAGNOSIS — I5030 Unspecified diastolic (congestive) heart failure: Secondary | ICD-10-CM | POA: Diagnosis not present

## 2019-09-03 DIAGNOSIS — T411X5A Adverse effect of intravenous anesthetics, initial encounter: Secondary | ICD-10-CM | POA: Diagnosis not present

## 2019-09-03 DIAGNOSIS — R002 Palpitations: Secondary | ICD-10-CM | POA: Diagnosis not present

## 2019-09-03 DIAGNOSIS — Z79899 Other long term (current) drug therapy: Secondary | ICD-10-CM

## 2019-09-03 DIAGNOSIS — G4733 Obstructive sleep apnea (adult) (pediatric): Secondary | ICD-10-CM | POA: Diagnosis not present

## 2019-09-03 DIAGNOSIS — I471 Supraventricular tachycardia: Principal | ICD-10-CM | POA: Diagnosis present

## 2019-09-03 DIAGNOSIS — Z6841 Body Mass Index (BMI) 40.0 and over, adult: Secondary | ICD-10-CM

## 2019-09-03 DIAGNOSIS — R197 Diarrhea, unspecified: Secondary | ICD-10-CM | POA: Diagnosis not present

## 2019-09-03 DIAGNOSIS — Z7982 Long term (current) use of aspirin: Secondary | ICD-10-CM

## 2019-09-03 DIAGNOSIS — J811 Chronic pulmonary edema: Secondary | ICD-10-CM

## 2019-09-03 DIAGNOSIS — J449 Chronic obstructive pulmonary disease, unspecified: Secondary | ICD-10-CM | POA: Diagnosis present

## 2019-09-03 DIAGNOSIS — Z955 Presence of coronary angioplasty implant and graft: Secondary | ICD-10-CM

## 2019-09-03 DIAGNOSIS — I2510 Atherosclerotic heart disease of native coronary artery without angina pectoris: Secondary | ICD-10-CM | POA: Diagnosis not present

## 2019-09-03 DIAGNOSIS — Z20828 Contact with and (suspected) exposure to other viral communicable diseases: Secondary | ICD-10-CM | POA: Diagnosis present

## 2019-09-03 DIAGNOSIS — I484 Atypical atrial flutter: Secondary | ICD-10-CM

## 2019-09-03 DIAGNOSIS — I5033 Acute on chronic diastolic (congestive) heart failure: Secondary | ICD-10-CM | POA: Diagnosis present

## 2019-09-03 DIAGNOSIS — I071 Rheumatic tricuspid insufficiency: Secondary | ICD-10-CM | POA: Diagnosis present

## 2019-09-03 DIAGNOSIS — I700 Atherosclerosis of aorta: Secondary | ICD-10-CM | POA: Diagnosis present

## 2019-09-03 DIAGNOSIS — I872 Venous insufficiency (chronic) (peripheral): Secondary | ICD-10-CM | POA: Diagnosis present

## 2019-09-03 DIAGNOSIS — E785 Hyperlipidemia, unspecified: Secondary | ICD-10-CM | POA: Diagnosis present

## 2019-09-03 DIAGNOSIS — M549 Dorsalgia, unspecified: Secondary | ICD-10-CM | POA: Diagnosis present

## 2019-09-03 DIAGNOSIS — I4719 Other supraventricular tachycardia: Secondary | ICD-10-CM | POA: Diagnosis present

## 2019-09-03 DIAGNOSIS — Z888 Allergy status to other drugs, medicaments and biological substances status: Secondary | ICD-10-CM

## 2019-09-03 DIAGNOSIS — Z881 Allergy status to other antibiotic agents status: Secondary | ICD-10-CM

## 2019-09-03 DIAGNOSIS — Z9049 Acquired absence of other specified parts of digestive tract: Secondary | ICD-10-CM

## 2019-09-03 DIAGNOSIS — Z9119 Patient's noncompliance with other medical treatment and regimen: Secondary | ICD-10-CM

## 2019-09-03 DIAGNOSIS — J309 Allergic rhinitis, unspecified: Secondary | ICD-10-CM | POA: Diagnosis present

## 2019-09-03 DIAGNOSIS — I4892 Unspecified atrial flutter: Secondary | ICD-10-CM

## 2019-09-03 DIAGNOSIS — Z9884 Bariatric surgery status: Secondary | ICD-10-CM

## 2019-09-03 DIAGNOSIS — Y92230 Patient room in hospital as the place of occurrence of the external cause: Secondary | ICD-10-CM | POA: Diagnosis not present

## 2019-09-03 DIAGNOSIS — I4819 Other persistent atrial fibrillation: Secondary | ICD-10-CM | POA: Diagnosis present

## 2019-09-03 DIAGNOSIS — E119 Type 2 diabetes mellitus without complications: Secondary | ICD-10-CM

## 2019-09-03 DIAGNOSIS — N179 Acute kidney failure, unspecified: Secondary | ICD-10-CM

## 2019-09-03 LAB — BASIC METABOLIC PANEL
Anion gap: 13 (ref 5–15)
BUN: 26 mg/dL — ABNORMAL HIGH (ref 8–23)
CO2: 25 mmol/L (ref 22–32)
Calcium: 9.1 mg/dL (ref 8.9–10.3)
Chloride: 100 mmol/L (ref 98–111)
Creatinine, Ser: 1.37 mg/dL — ABNORMAL HIGH (ref 0.61–1.24)
GFR calc Af Amer: >60 mL/min (ref >60)
GFR calc non Af Amer: 53 mL/min — ABNORMAL LOW (ref >60)
Glucose, Bld: 142 mg/dL — ABNORMAL HIGH (ref 70–99)
Potassium: 4.4 mmol/L (ref 3.5–5.1)
Sodium: 138 mmol/L (ref 135–145)

## 2019-09-03 LAB — CBC
HCT: 42.2 % (ref 39.0–52.0)
Hemoglobin: 13.2 g/dL (ref 13.0–17.0)
MCH: 30.1 pg (ref 26.0–34.0)
MCHC: 31.3 g/dL (ref 30.0–36.0)
MCV: 96.3 fL (ref 80.0–100.0)
Platelets: 249 10*3/uL (ref 150–400)
RBC: 4.38 MIL/uL (ref 4.22–5.81)
RDW: 14.5 % (ref 11.5–15.5)
WBC: 6.6 10*3/uL (ref 4.0–10.5)
nRBC: 0 % (ref 0.0–0.2)

## 2019-09-03 LAB — PROTIME-INR
INR: 1 (ref 0.8–1.2)
Prothrombin Time: 13.3 seconds (ref 11.4–15.2)

## 2019-09-03 LAB — HEPARIN LEVEL (UNFRACTIONATED): Heparin Unfractionated: 0.18 IU/mL — ABNORMAL LOW (ref 0.30–0.70)

## 2019-09-03 LAB — SARS CORONAVIRUS 2 (TAT 6-24 HRS): SARS Coronavirus 2: NEGATIVE

## 2019-09-03 LAB — APTT: aPTT: 65 seconds — ABNORMAL HIGH (ref 24–36)

## 2019-09-03 LAB — TSH: TSH: 3.291 u[IU]/mL (ref 0.350–4.500)

## 2019-09-03 LAB — GLUCOSE, CAPILLARY: Glucose-Capillary: 152 mg/dL — ABNORMAL HIGH (ref 70–99)

## 2019-09-03 LAB — TROPONIN I (HIGH SENSITIVITY)
Troponin I (High Sensitivity): 6 ng/L (ref <18)
Troponin I (High Sensitivity): 4 ng/L (ref <18)

## 2019-09-03 LAB — BRAIN NATRIURETIC PEPTIDE: B Natriuretic Peptide: 125 pg/mL — ABNORMAL HIGH (ref 0.0–100.0)

## 2019-09-03 LAB — FIBRIN DERIVATIVES D-DIMER (ARMC ONLY): Fibrin derivatives D-dimer (ARMC): 1165.6 ng/mL (FEU) — ABNORMAL HIGH (ref 0.00–499.00)

## 2019-09-03 MED ORDER — ACETAMINOPHEN 650 MG RE SUPP: 650.0000 mg | Freq: Four times a day (QID) | RECTAL | Status: DC | PRN

## 2019-09-03 MED ORDER — LISINOPRIL 20 MG PO TABS
20.0000 mg | ORAL_TABLET | Freq: Two times a day (BID) | ORAL | Status: DC
  Filled 2019-09-03 (×2): qty 1

## 2019-09-03 MED ORDER — METOPROLOL TARTRATE 50 MG PO TABS: 75.0000 mg | ORAL_TABLET | Freq: Two times a day (BID) | ORAL | Status: DC

## 2019-09-03 MED ORDER — HEPARIN (PORCINE) 25000 UT/250ML-% IV SOLN
1700.0000 [IU]/h | INTRAVENOUS | Status: DC
  Administered 2019-09-03: 1400 [IU]/h via INTRAVENOUS
  Administered 2019-09-04: 1700 [IU]/h via INTRAVENOUS
  Filled 2019-09-03 (×2): qty 250

## 2019-09-03 MED ORDER — SODIUM CHLORIDE 0.9% FLUSH: 3.0000 mL | Freq: Once | INTRAVENOUS | Status: DC

## 2019-09-03 MED ORDER — ACETAMINOPHEN 325 MG PO TABS
650.0000 mg | ORAL_TABLET | Freq: Four times a day (QID) | ORAL | Status: DC | PRN
  Administered 2019-09-04 – 2019-09-12 (×6): 650 mg via ORAL
  Filled 2019-09-03 (×7): qty 2

## 2019-09-03 MED ORDER — DILTIAZEM HCL 100 MG IV SOLR
5.0000 mg/h | INTRAVENOUS | Status: DC
  Administered 2019-09-03 – 2019-09-04 (×3): 15 mg/h via INTRAVENOUS
  Filled 2019-09-03 (×4): qty 100

## 2019-09-03 MED ORDER — HEPARIN BOLUS VIA INFUSION
5500.0000 [IU] | Freq: Once | INTRAVENOUS | Status: AC
  Administered 2019-09-03: 5500 [IU] via INTRAVENOUS
  Filled 2019-09-03: qty 5500

## 2019-09-03 MED ORDER — ALPRAZOLAM 0.25 MG PO TABS
0.2500 mg | ORAL_TABLET | Freq: Two times a day (BID) | ORAL | Status: DC | PRN
  Administered 2019-09-07: 0.25 mg via ORAL
  Filled 2019-09-03: qty 1

## 2019-09-03 MED ORDER — TRAMADOL HCL 50 MG PO TABS
50.0000 mg | ORAL_TABLET | Freq: Three times a day (TID) | ORAL | Status: DC | PRN
  Administered 2019-09-04 – 2019-09-12 (×8): 50 mg via ORAL
  Filled 2019-09-03 (×8): qty 1

## 2019-09-03 MED ORDER — DILTIAZEM HCL 100 MG IV SOLR
5.0000 mg/h | INTRAVENOUS | Status: DC
  Administered 2019-09-03: 5 mg/h via INTRAVENOUS
  Filled 2019-09-03: qty 100

## 2019-09-03 MED ORDER — METOPROLOL TARTRATE 25 MG PO TABS
25.0000 mg | ORAL_TABLET | Freq: Four times a day (QID) | ORAL | Status: DC
  Administered 2019-09-03 – 2019-09-04 (×3): 25 mg via ORAL
  Filled 2019-09-03 (×3): qty 1

## 2019-09-03 MED ORDER — TIZANIDINE HCL 2 MG PO TABS
2.0000 mg | ORAL_TABLET | Freq: Three times a day (TID) | ORAL | Status: DC | PRN
  Administered 2019-09-09: 20:00:00 2 mg via ORAL
  Filled 2019-09-03 (×3): qty 1

## 2019-09-03 MED ORDER — ALLOPURINOL 300 MG PO TABS
300.0000 mg | ORAL_TABLET | Freq: Every day | ORAL | Status: DC
  Administered 2019-09-04 – 2019-09-10 (×7): 300 mg via ORAL
  Filled 2019-09-03 (×7): qty 1

## 2019-09-03 MED ORDER — MAGNESIUM CITRATE PO SOLN
1.0000 | Freq: Once | ORAL | Status: DC | PRN
  Filled 2019-09-03: qty 296

## 2019-09-03 MED ORDER — HEPARIN SODIUM (PORCINE) 5000 UNIT/ML IJ SOLN: 5000.0000 [IU] | Freq: Three times a day (TID) | INTRAMUSCULAR | Status: DC

## 2019-09-03 MED ORDER — ATORVASTATIN CALCIUM 20 MG PO TABS
40.0000 mg | ORAL_TABLET | Freq: Every day | ORAL | Status: DC
  Administered 2019-09-03 – 2019-09-11 (×9): 40 mg via ORAL
  Filled 2019-09-03 (×10): qty 2

## 2019-09-03 MED ORDER — INFLUENZA VAC A&B SA ADJ QUAD 0.5 ML IM PRSY
0.5000 mL | PREFILLED_SYRINGE | INTRAMUSCULAR | Status: AC
  Administered 2019-09-09: 0.5 mL via INTRAMUSCULAR
  Filled 2019-09-03: qty 0.5

## 2019-09-03 MED ORDER — GABAPENTIN 300 MG PO CAPS
300.0000 mg | ORAL_CAPSULE | Freq: Every day | ORAL | Status: DC
  Administered 2019-09-03 – 2019-09-11 (×9): 300 mg via ORAL
  Filled 2019-09-03 (×9): qty 1

## 2019-09-03 MED ORDER — TRAZODONE HCL 50 MG PO TABS
50.0000 mg | ORAL_TABLET | Freq: Every day | ORAL | Status: DC
  Administered 2019-09-03 – 2019-09-11 (×9): 50 mg via ORAL
  Filled 2019-09-03 (×9): qty 1

## 2019-09-03 MED ORDER — DILTIAZEM LOAD VIA INFUSION
10.0000 mg | Freq: Once | INTRAVENOUS | Status: AC
  Administered 2019-09-03: 12:00:00 10 mg via INTRAVENOUS
  Filled 2019-09-03: qty 10

## 2019-09-03 MED ORDER — PNEUMOCOCCAL VAC POLYVALENT 25 MCG/0.5ML IJ INJ
0.5000 mL | INJECTION | INTRAMUSCULAR | Status: DC
  Filled 2019-09-03 (×2): qty 0.5

## 2019-09-03 MED ORDER — ADENOSINE 6 MG/2ML IV SOLN
6.0000 mg | Freq: Once | INTRAVENOUS | Status: AC
  Administered 2019-09-03: 6 mg via INTRAVENOUS
  Filled 2019-09-03: qty 2

## 2019-09-03 MED ORDER — NITROGLYCERIN 0.4 MG SL SUBL: 0.4000 mg | SUBLINGUAL_TABLET | SUBLINGUAL | Status: DC | PRN

## 2019-09-03 MED ORDER — ONDANSETRON HCL 4 MG/2ML IJ SOLN
4.0000 mg | Freq: Four times a day (QID) | INTRAMUSCULAR | Status: DC | PRN
  Administered 2019-09-11: 10:00:00 4 mg via INTRAVENOUS
  Filled 2019-09-03: qty 2

## 2019-09-03 MED ORDER — LORATADINE 10 MG PO TABS
10.0000 mg | ORAL_TABLET | Freq: Every day | ORAL | Status: DC
  Administered 2019-09-03 – 2019-09-11 (×8): 10 mg via ORAL
  Filled 2019-09-03 (×9): qty 1

## 2019-09-03 MED ORDER — BISACODYL 5 MG PO TBEC: 5.0000 mg | DELAYED_RELEASE_TABLET | Freq: Every day | ORAL | Status: DC | PRN

## 2019-09-03 MED ORDER — ONDANSETRON HCL 4 MG PO TABS: 4.0000 mg | ORAL_TABLET | Freq: Four times a day (QID) | ORAL | Status: DC | PRN

## 2019-09-03 MED ORDER — TRAZODONE HCL 50 MG PO TABS: 50.0000 mg | ORAL_TABLET | Freq: Every evening | ORAL | Status: DC | PRN

## 2019-09-03 MED ORDER — HEPARIN BOLUS VIA INFUSION
2800.0000 [IU] | Freq: Once | INTRAVENOUS | Status: AC
  Administered 2019-09-03: 22:00:00 2800 [IU] via INTRAVENOUS
  Filled 2019-09-03: qty 2800

## 2019-09-03 MED ORDER — ASPIRIN 81 MG PO CHEW
81.0000 mg | CHEWABLE_TABLET | Freq: Every day | ORAL | Status: DC
  Filled 2019-09-03: qty 1

## 2019-09-03 MED ORDER — POLYETHYLENE GLYCOL 3350 17 G PO PACK: 17.0000 g | PACK | Freq: Every day | ORAL | Status: DC | PRN

## 2019-09-03 NOTE — ED Provider Notes (Signed)
[facility] Emergency Department Provider Note ____________________________________________   First MD Initiated Contact with Patient 09/03/19 1002     (approximate)  I have reviewed the triage vital signs and the nursing notes.  HISTORY  Chief Complaint Chest Pain  HPI Aaron Ball is a 68 y.o. male presents today for racing heart  Patient reports last night during the middle of the evening he noticed it felt like his heart was racing.  Reports has had the same in the past with atrial fibrillation.  He is having racing heart.  Not real short of breath or anything with it.  He does have swelling in both of his lower legs but reports that that is normal for him and he takes Lasix.  No shortness of breath.  Reports a racing sensation and slight discomfort across the chest.  No fevers or chills.  Denies that it is "painful" but he did take 4 baby aspirin during the mid morning when this started.  He had previously atrial fibrillation.    Past Medical History:  Diagnosis Date  . A-fib (HCC) 04/2011   a. s/p ablation x 2 w/o evi of recurrence   . Chronic diastolic CHF (congestive heart failure) (HCC)    a. echo 2012: EF >55%, nl RVSP  . COPD (chronic obstructive pulmonary disease) (HCC)   . Coronary artery disease    a. s/p stent in RCA in 1997 w/ ISR 2003 & 05/2013 s/p DES to mid RCA 05/2013; b. cardiac cath 02/2014: nonobstructive CAD with patent stent in RCA with 40% ISR, no evi of pulm HTN, nl LVEDP, EF >65%   . Diabetes mellitus (HCC)   . GERD (gastroesophageal reflux disease)   . Gout   . Hyperlipidemia   . Hypertension   . Morbid obesity (HCC)   . Obesity hypoventilation syndrome (HCC)   . OSA (obstructive sleep apnea)   . Wears hearing aid    right ear    Patient Active Problem List   Diagnosis Date Noted  . Atrial tachycardia (HCC) 09/03/2019  . Chest pain, central 10/08/2015  . CAD (coronary artery disease) 06/28/2011  . Obesity  06/28/2011  . Hyperlipidemia 06/28/2011  . HTN (hypertension) 06/28/2011    Past Surgical History:  Procedure Laterality Date  . ATRIAL ABLATION SURGERY     A-Fib  . BARIATRIC SURGERY    . CARDIAC CATHETERIZATION     s/p stent to RCA.   [name]. CARDIAC CATHETERIZATION  2008   [facility] .  [name]. CHOLECYSTECTOMY  2012  . heart stint  2013  . HIP FRACTURE SURGERY    . STOMACH SURGERY     gastric sleeve    Prior to Admission medications   Medication Sig Start Date End Date Taking? Authorizing Provider  albuterol (PROVENTIL HFA;VENTOLIN HFA) 108 (90 Base) MCG/ACT inhaler Inhale 2 puffs into the lungs every 6 (six) hours as needed for wheezing or shortness of breath. 12/15/17   Merrily Brittleifenbark, Neil, MD  aspirin 81 MG tablet Take 81 mg by mouth daily. Take 162 mg daily.    [provider]  atorvastatin (LIPITOR) 40 MG tablet Take 1 tablet by mouth daily. 08/10/15   [provider]  colchicine 0.6 MG tablet Take 0.6 mg by mouth daily. 07/21/15   [provider]  cyclobenzaprine (FLEXERIL) 5 MG tablet Take 1 tablet (5 mg total) by mouth 3 (three) times daily as needed for muscle spasms. 09/02/18   Minna AntisPaduchowski, Kevin, MD  diclofenac  sodium (VOLTAREN) 1 % GEL Apply 2 g topically 4 (four) times daily. 08/22/16   Enid Derry, PA-C  gabapentin (NEURONTIN) 300 MG capsule Take 300 mg by mouth at bedtime.    [provider]  metoprolol succinate (TOPROL-XL) 25 MG 24 hr tablet Take 1 tablet by mouth daily. 07/26/15   [provider]  nitroGLYCERIN (NITROSTAT) 0.4 MG SL tablet Place 0.4 mg under the tongue every 5 (five) minutes as needed.  07/08/15   [provider]  oxyCODONE-acetaminophen (PERCOCET/ROXICET) 5-325 MG tablet Take 1-2 tablets by mouth every 6 (six) hours as needed for severe pain. 04/09/18   Sharyn Creamer, MD  predniSONE (DELTASONE) 10 MG tablet Take 6 tablets on day 1, take 5 tablets on day 2, take 4 tablets on day 3, take 3 tablets on day  4, take 2 tablets on day 5, take 1 tablet on day 6 03/26/18   Enid Derry, PA-C  Spacer/Aero Chamber Mouthpiece MISC 1 Units by Does not apply route every 4 (four) hours as needed (wheezing). 12/15/17   Merrily Brittle, MD  traZODone (DESYREL) 100 MG tablet Take 1.5 tablets by mouth daily. 08/19/15   [provider]    Allergies Erythromycin  Family History  Problem Relation Age of Onset  . Heart disease Father   . Heart attack Father     Social History Social History   Tobacco Use  . Smoking status: Former Smoker    Packs/day: 1.00    Years: 30.00    Pack years: 30.00    Types: Cigarettes    Quit date: 06/28/2003    Years since quitting: 16.1  . Smokeless tobacco: Never Used  Substance Use Topics  . Alcohol use: No  . Drug use: No    Review of Systems Constitutional: No fever/chills Eyes: No visual changes. ENT: No sore throat. Cardiovascular: See HPI Respiratory: Denies shortness of breath. Gastrointestinal: No abdominal pain.   Genitourinary: Negative for dysuria. Musculoskeletal: Negative for back pain. Skin: Negative for rash. Neurological: Negative for headaches, areas of focal weakness or numbness.    ____________________________________________   PHYSICAL EXAM:  VITAL SIGNS: ED Triage Vitals  Enc Vitals Group     BP 09/03/19 0948 (!) 140/96     Pulse Rate 09/03/19 0948 (!) 129     Resp 09/03/19 0948 (!) 22     Temp 09/03/19 0948 97.7 F (36.5 C)     Temp Source 09/03/19 0948 Oral     SpO2 09/03/19 0948 98 %     Weight 09/03/19 0949 300 lb (136.1 kg)     Height 09/03/19 0949 5\' 5"  (1.651 m)     Head Circumference --      Peak Flow --      Pain Score 09/03/19 0949 3     Pain Loc --      Pain Edu? --      Excl. in [place]? --     Constitutional: Alert and oriented. Well appearing and in no acute distress. Eyes: Conjunctivae are normal. Head: Atraumatic. Nose: No congestion/rhinnorhea. Mouth/Throat: Mucous membranes are moist. Neck: No  stridor.  Cardiovascular: Tachycardic rate, regular rhythm. Grossly normal heart sounds.  Good peripheral circulation. Respiratory: Normal respiratory effort.  No retractions. Lungs CTAB. Gastrointestinal: Soft and nontender. No distention. Musculoskeletal: No lower extremity tenderness but has about 3+ pitting edema lower extremities bilateral with venous stasis changes bilateral.  Patient reports that this is chronic and not at all unusual for him to be this swollen. Neurologic:  Normal speech and language. No gross focal neurologic deficits are appreciated.  Skin:  Skin is warm, dry and intact. No rash noted. Psychiatric: Mood and affect are normal. Speech and behavior are normal.  ____________________________________________   LABS (all labs ordered are listed, but only abnormal results are displayed)  Labs Reviewed  BASIC METABOLIC PANEL - Abnormal; Notable for the following components:      Result Value   Glucose, Bld 142 (*)    BUN 26 (*)    Creatinine, Ser 1.37 (*)    GFR calc non Af Amer 53 (*)    All other components within normal limits  BRAIN NATRIURETIC PEPTIDE - Abnormal; Notable for the following components:   B Natriuretic Peptide 125.0 (*)    All other components within normal limits  FIBRIN DERIVATIVES D-DIMER ([facility] ONLY) - Abnormal; Notable for the following components:   Fibrin derivatives D-dimer ([facility]) 1,165.60 (*)    All other components within normal limits  SARS CORONAVIRUS 2 (TAT 6-24 HRS)  CBC  TSH  HIV ANTIBODY (ROUTINE TESTING W REFLEX)  TROPONIN I (HIGH SENSITIVITY)  TROPONIN I (HIGH SENSITIVITY)   ____________________________________________  EKG  Reviewed entered by me at 945 Heart rate 130 QRS 90 QTc 460 Sinus tachycardia, rate of 130.  No evidence of acute ischemia denoted.  I do believe the EKG represents sinus tachycardia, though there are some elements of that the P wave seems just slightly unusual.  I discussed his EKG and reviewed with  Dr. Fletcher Anon Who advises consideration for SVT or atrial ectopic  ____________________________________________  RADIOLOGY  DG Chest 2 View  Result Date: 09/03/2019 CLINICAL DATA:  Heart palpitations EXAM: CHEST - 2 VIEW COMPARISON:  12/15/2017 FINDINGS: The heart is mildly enlarged but stable. Stable tortuosity and ectasia of the thoracic aorta. Chronic bronchitic type lung changes and probable emphysema. No definite acute overlying pulmonary process. No pleural effusion. The bony thorax is intact. IMPRESSION: Stable cardiac enlargement. Chronic bronchitic and probable emphysematous changes but no acute pulmonary findings. Electronically Signed   By: Marijo Sanes M.D.   On: 09/03/2019 10:27    Imaging reviewed negative for acute finding ____________________________________________   PROCEDURES  Procedure(s) performed: None  Procedures  Critical Care performed: Yes, see critical care note(s)  CRITICAL CARE Performed by: Delman Kitten   Total critical care time: 40 minutes  Critical care time was exclusive of separately billable procedures and treating other patients.  Critical care was necessary to treat or prevent imminent or life-threatening deterioration.  Critical care was time spent personally by me on the following activities: development of treatment plan with patient and/or surrogate as well as nursing, discussions with consultants, evaluation of patient's response to treatment, examination of patient, obtaining history from patient or surrogate, ordering and performing treatments and interventions, ordering and review of laboratory studies, ordering and review of radiographic studies, pulse oximetry and re-evaluation of patient's condition.  ____________________________________________   INITIAL IMPRESSION / ASSESSMENT AND PLAN / ED COURSE  Pertinent labs & imaging results that were available during my care of the patient were reviewed by me and considered in my medical  decision making (see chart for details).   Patient was for palpitations.  He does have rapid heart rate in about the 130 range.  Appears likely to be sinus tachycardia.  However he has had previous ablation, denies associated chest pain except reports a slight discomfort in the chest.  Took aspirin at home.  Await cardiac markers.  No evidence of acute  ischemia denoted on EKG.  Etiology for elevated heart rate is not yet known, plan to consult with cardiology once they are out of the Cath Lab.  Denies infectious symptoms.  No acute shortness of breath.  Does appear volume overloaded we will add BNP as well.  Clinical Course as of Sep 02 1416  Tue Sep 03, 2019  1053 Case and EKG discussed with Dr. Lady Gary   [MQ]  1058 Dr. Lady Gary reviewed records, so not see patient has been a [facility] cardiology patient prior. Recommends [facility] consultation as prior visits with Dr. Mariah Milling    [MQ]  1106 Discussed case with Dr. Kirke Corin. [facility] will consult on patient. Recommends consider bolus of adenosine for treatment and evaluation for possibel underlying.    [MQ]  1243 Patient was given a [place], his rate briefly slowed, there is a slight period where I believe there may be some sort of underlying atrial tachycardia.  His heart rate quickly went right back to a steady 130.  Patient being admitted, cardiology consulting, now on diltiazem drip, rate slowing at times to about the 110.,  Patient tolerating very well, but slight variability however primarily reliance at a rate of about 128-1 30   [MQ]    Clinical Course User Index [MQ] Sharyn Creamer, MD   Aaron Ball was evaluated in Emergency Department on 09/03/2019 for the symptoms described in the history of present illness. He was evaluated in the context of the global COVID-19 pandemic, which necessitated consideration that the patient might be at risk for infection with the SARS-CoV-2 virus that causes COVID-19. Institutional protocols and algorithms that pertain to  the evaluation of patients at risk for COVID-19 are in a state of rapid change based on information released by regulatory bodies including the CDC and federal and state organizations. These policies and algorithms were followed during the patient's care in the ED.   ____________________________________________   FINAL CLINICAL IMPRESSION(S) / ED DIAGNOSES  Final diagnoses:  Sustained SVT (HCC)  Probable atrial tachycardia or other atrial ectopic rhythm      Note:  This document was prepared using Dragon voice recognition software and may include unintentional dictation errors       Sharyn Creamer, MD 09/03/19 1418

## 2019-09-03 NOTE — H&P (Signed)
History and Physical    Aaron Ball XBM:[id number] DOB: March 25, 1951 DOA: 09/03/2019  PCP: System, Pcp Not In   Patient coming from: Home  I have personally briefly reviewed patient's old medical records in [facility]  Chief Complaint: Palpitations  HPI: Aaron Ball is a 68 y.o. male with history of multiple comorbidities as outlined below, including coronary artery disease status post multiple stents, persistent A. fib status post ablation x2, type 2 diabetes, morbid obesity status post sleeve gastrectomy, OHS/OSA on CPAP, COPD.  He presented to the ED today with persistent palpitations and heart racing.  Reports symptoms woke him up from sleep at 3 AM this morning.  He denies any chest pain, but does report associated shortness of breath.  He denies any other recent symptoms including fever, chills, congestion, abdominal pain, nausea vomiting or diarrhea.  Also denies any sick contacts.  He states that his symptoms at this time feels similar to his A. fib with RVR prior to his ablations in the past.  In addition, states he does have chronic back pain for which he takes tramadol at home.  ED Course: Heart rate 120s to upper 130s, labile blood pressure as low as 101/85, as high as 163/104, other vitals stable.  Labs notable for BUN 26, creatinine 1.37, BNP 125, D-dimer 1165, TSH normal.  Initially treated with adenosine with very temporary improvement in heart rate.  Subsequently started on Cardizem drip with somewhat better rate control, 110s to 120s during my encounter with Cardizem at 15 mg/h.  Patient admitted to telemetry bed for further management with cardiology consulted.  COVID-19 PCR pending.  Review of Systems: As per HPI otherwise 10 point review of systems negative.   Past Medical History:  Diagnosis Date  . A-fib (HCC) 04/2011   a. s/p ablation x 2 w/o evi of recurrence   . Chronic diastolic CHF (congestive heart failure) (HCC)    a. echo 2012: EF >55%, nl  RVSP  . COPD (chronic obstructive pulmonary disease) (HCC)   . Coronary artery disease    a. s/p stent in RCA in 1997 w/ ISR 2003 & 05/2013 s/p DES to mid RCA 05/2013; b. cardiac cath 02/2014: nonobstructive CAD with patent stent in RCA with 40% ISR, no evi of pulm HTN, nl LVEDP, EF >65%   . Diabetes mellitus (HCC)   . GERD (gastroesophageal reflux disease)   . Gout   . Hyperlipidemia   . Hypertension   . Morbid obesity (HCC)   . Obesity hypoventilation syndrome (HCC)   . OSA (obstructive sleep apnea)   . Wears hearing aid    right ear    Past Surgical History:  Procedure Laterality Date  . ATRIAL ABLATION SURGERY     A-Fib  . BARIATRIC SURGERY    . CARDIAC CATHETERIZATION     s/p stent to RCA.   [name] CARDIAC CATHETERIZATION  2008   Dr. Juliann Pares @ [facility] .  [name] CHOLECYSTECTOMY  2012  . heart stint  2013  . HIP FRACTURE SURGERY    . STOMACH SURGERY     gastric sleeve     reports that he quit smoking about 16 years ago. His smoking use included cigarettes. He has a 30.00 pack-year smoking history. He has never used smokeless tobacco. He reports that he does not drink alcohol or use drugs.  Allergies  Allergen Reactions  . Erythromycin Hives and Anaphylaxis  . Cyclobenzaprine Palpitations    Triggered a fib    Family  History  Problem Relation Age of Onset  . Heart disease Father   . Heart attack Father    Unacceptable: Noncontributory, unremarkable, or negative. Acceptable: Family history reviewed and not pertinent (If you reviewed it)  Prior to Admission medications   Medication Sig Start Date End Date Taking? Authorizing Provider  albuterol (PROVENTIL HFA;VENTOLIN HFA) 108 (90 Base) MCG/ACT inhaler Inhale 2 puffs into the lungs every 6 (six) hours as needed for wheezing or shortness of breath. 12/15/17   Merrily Brittle, MD  aspirin 81 MG tablet Take 81 mg by mouth daily. Take 162 mg daily.    [provider]  atorvastatin (LIPITOR) 40 MG tablet Take 1 tablet by mouth  daily. 08/10/15   [provider]  colchicine 0.6 MG tablet Take 0.6 mg by mouth daily. 07/21/15   [provider]  cyclobenzaprine (FLEXERIL) 5 MG tablet Take 1 tablet (5 mg total) by mouth 3 (three) times daily as needed for muscle spasms. 09/02/18   Minna Antis, MD  diclofenac sodium (VOLTAREN) 1 % GEL Apply 2 g topically 4 (four) times daily. 08/22/16   Enid Derry, PA-C  gabapentin (NEURONTIN) 300 MG capsule Take 300 mg by mouth at bedtime.    [provider]  metoprolol succinate (TOPROL-XL) 25 MG 24 hr tablet Take 1 tablet by mouth daily. 07/26/15   [provider]  nitroGLYCERIN (NITROSTAT) 0.4 MG SL tablet Place 0.4 mg under the tongue every 5 (five) minutes as needed.  07/08/15   [provider]  oxyCODONE-acetaminophen (PERCOCET/ROXICET) 5-325 MG tablet Take 1-2 tablets by mouth every 6 (six) hours as needed for severe pain. 04/09/18   Sharyn Creamer, MD  predniSONE (DELTASONE) 10 MG tablet Take 6 tablets on day 1, take 5 tablets on day 2, take 4 tablets on day 3, take 3 tablets on day 4, take 2 tablets on day 5, take 1 tablet on day 6 03/26/18   Enid Derry, PA-C  Spacer/Aero Chamber Mouthpiece MISC 1 Units by Does not apply route every 4 (four) hours as needed (wheezing). 12/15/17   Merrily Brittle, MD  traZODone (DESYREL) 100 MG tablet Take 1.5 tablets by mouth daily. 08/19/15   [provider]    Physical Exam: Vitals:   09/03/19 1838 09/03/19 1859 09/03/19 1900 09/03/19 1901  BP: 124/87  125/88   Pulse: (!) 136 (!) 139 (!) 139 (!) 138  Resp:   18   Temp:      TempSrc:      SpO2: 99% 99% 99% 99%  Weight:      Height:        Constitutional: NAD, calm, comfortable Vitals:   09/03/19 1838 09/03/19 1859 09/03/19 1900 09/03/19 1901  BP: 124/87  125/88   Pulse: (!) 136 (!) 139 (!) 139 (!) 138  Resp:   18   Temp:      TempSrc:      SpO2: 99% 99% 99% 99%  Weight:      Height:       Eyes: PERRL, lids and  conjunctivae normal ENMT: Mucous membranes are moist. Posterior pharynx clear of any exudate or lesions.Normal dentition.  Neck: normal, supple, no masses, no thyromegaly Respiratory: clear to auscultation bilaterally, no wheezing, no crackles. Normal respiratory effort. No accessory muscle use.  Cardiovascular: Regular rate and rhythm, no murmurs / rubs / gallops. No extremity edema. 2+ pedal pulses. No carotid bruits.  Abdomen: no tenderness, no masses palpated. No hepatosplenomegaly. Bowel sounds positive.  Musculoskeletal: no clubbing /  cyanosis. No joint deformity upper and lower extremities. Good ROM, no contractures. Normal muscle tone.  Skin: no rashes, lesions, ulcers. No induration Neurologic: CN 2-12 grossly intact. Sensation intact, DTR normal. Strength 5/5 in all 4.  Psychiatric: Normal judgment and insight. Alert and oriented x 3. Normal mood.   (Anything < 9 systems with 2 bullets each down codes to level 1) (If patient refuses exam can't bill higher level) (Make sure to document decubitus ulcers present on admission -- if possible -- and whether patient has chronic indwelling catheter at time of admission)  Labs on Admission: I have personally reviewed following labs and imaging studies  CBC: Recent Labs  Lab 09/03/19 0955  WBC 6.6  HGB 13.2  HCT 42.2  MCV 96.3  PLT 249   Basic Metabolic Panel: Recent Labs  Lab 09/03/19 0955  NA 138  K 4.4  CL 100  CO2 25  GLUCOSE 142*  BUN 26*  CREATININE 1.37*  CALCIUM 9.1   GFR: Estimated Creatinine Clearance: 66.6 mL/min (A) (by C-G formula based on SCr of 1.37 mg/dL (H)). Liver Function Tests: No results for input(s): AST, ALT, ALKPHOS, BILITOT, PROT, ALBUMIN in the last 168 hours. No results for input(s): LIPASE, AMYLASE in the last 168 hours. No results for input(s): AMMONIA in the last 168 hours. Coagulation Profile: No results for input(s): INR, PROTIME in the last 168 hours. Cardiac Enzymes: No results for  input(s): CKTOTAL, CKMB, CKMBINDEX, TROPONINI in the last 168 hours. BNP (last 3 results) No results for input(s): PROBNP in the last 8760 hours. HbA1C: No results for input(s): HGBA1C in the last 72 hours. CBG: No results for input(s): GLUCAP in the last 168 hours. Lipid Profile: No results for input(s): CHOL, HDL, LDLCALC, TRIG, CHOLHDL, LDLDIRECT in the last 72 hours. Thyroid Function Tests: Recent Labs    09/03/19 0955  TSH 3.291   Anemia Panel: No results for input(s): VITAMINB12, FOLATE, FERRITIN, TIBC, IRON, RETICCTPCT in the last 72 hours. Urine analysis:    Component Value Date/Time   COLORURINE STRAW (A) 08/20/2015 1410   APPEARANCEUR CLEAR (A) 08/20/2015 1410   APPEARANCEUR Cloudy 01/31/2012 1220   LABSPEC 1.012 08/20/2015 1410   LABSPEC 1.019 01/31/2012 1220   PHURINE 7.0 08/20/2015 1410   GLUCOSEU NEGATIVE 08/20/2015 1410   GLUCOSEU Negative 01/31/2012 1220   HGBUR NEGATIVE 08/20/2015 1410   BILIRUBINUR NEGATIVE 08/20/2015 1410   BILIRUBINUR Negative 01/31/2012 1220   KETONESUR NEGATIVE 08/20/2015 1410   PROTEINUR NEGATIVE 08/20/2015 1410   NITRITE NEGATIVE 08/20/2015 1410   LEUKOCYTESUR NEGATIVE 08/20/2015 1410   LEUKOCYTESUR 3+ 01/31/2012 1220    Radiological Exams on Admission: DG Chest 2 View  Result Date: 09/03/2019 CLINICAL DATA:  Heart palpitations EXAM: CHEST - 2 VIEW COMPARISON:  12/15/2017 FINDINGS: The heart is mildly enlarged but stable. Stable tortuosity and ectasia of the thoracic aorta. Chronic bronchitic type lung changes and probable emphysema. No definite acute overlying pulmonary process. No pleural effusion. The bony thorax is intact. IMPRESSION: Stable cardiac enlargement. Chronic bronchitic and probable emphysematous changes but no acute pulmonary findings. Electronically Signed   By: Rudie MeyerP.  Gallerani M.D.   On: 09/03/2019 10:27    EKG: Independently reviewed.   Assessment/Plan Principal Problem:   Atrial tachycardia (HCC) Active  Problems:   CAD (coronary artery disease)   HTN (hypertension)   Type 2 diabetes mellitus (HCC)   Obesity   Hyperlipidemia   Chronic back pain   Gout   Allergic rhinitis   OSA (obstructive sleep  apnea)   COPD (chronic obstructive pulmonary disease) (HCC)   Atrial tachycardia (HCC) - Atypical atrial flutter with RVR --Cardiology following -Continue Cardizem drip -Metoprolol added for additional rate control -Heparin drip for anticoagulation -Will need long-term anticoagulant  CAD (coronary artery disease) -stable --Continue aspirin and Plavix, sublingual nitro as needed  HTN (hypertension)  -Continue home medications  Type 2 diabetes mellitus ([place]) Last A1c in epic was 6.9 back in 2017.  Currently does not appear to be on any medications for this. --We will monitor glucose by BMP for now and add sliding scale coverage if glucose over 180  Obesity Status post sleeve gastrectomy --Counseled on diet and lifestyle for weight loss  Hyperlipidemia -Continue home Lipitor  Chronic back pain -Continue home tramadol  Gout -not acutely flared -Continue home allopurinol  Allergic rhinitis -Continue home loratadine  OSA (obstructive sleep apnea) / OHS (obesity hypoventilation syndrome) --CPAP ordered  COPD (chronic obstructive pulmonary disease) (HCC)  Lower extremity edema -Continue home Lasix as needed    DVT prophylaxis: On heparin drip  Code Status: Full Family Communication: None at bedside Disposition Plan: Expect discharge home pending further improvement cardiology clearance Consults called: Cardiology, Dr. Fletcher Anon Admission status: Obs   Ezekiel Slocumb, DO [facility]   If 7PM-7AM, please contact night-coverage [email or web address] Password [email or web address]  09/03/2019, 7:10 PM

## 2019-09-03 NOTE — Progress Notes (Addendum)
Received pt from ED; HR cont in 120's 140. Pt is DOE, but relieved with rest. On RA with sat 95. Dilt gtt infusing at 15 mg/hr. Heparin infusing at 1400 units per hour. Pt A&O x 4, and is very HOH.  Pt instructed to stay in bed. On call APP notified of pt's status.   No additional orders received. Will CTM

## 2019-09-03 NOTE — Progress Notes (Signed)
Pt refuses to wear CPAP. Pt states he knows he's suppose to wear at night but he doesn't. He does not wear his CPAP at home either. Left CPAP at bedside.

## 2019-09-03 NOTE — Consult Note (Signed)
Cardiology Consultation:   Patient ID: Aaron Ball; [id number]; 1951/04/12   Admit date: 09/03/2019 Date of Consult: 09/03/2019  Primary Care Provider: System, Pcp Not In Primary Cardiologist: [facility]   Patient Profile:   Aaron Ball is a 68 y.o. male with a hx of CAD status post PCI to the proximal RCA in 9509 complicated by ISR in 3267 and 2014 status post PCI/DES to the mid RCA in 05/2013, persistent A. fib status post prior ablation x2 in 2010 and 2012, HFpEF, paroxysmal SVT, DM2, COPD quitting tobacco use in 2005, morbid obesity status post sleeve gastrectomy, OHS/OSA on CPAP, hyperparathyroidism, and GERD who is being seen today for the evaluation of atrial tachycardia at the request of Dr. Jacqualine Code.  History of Present Illness:   Aaron Ball was previously followed by Dr. Rockey Situ though more recently has established with [facility].  Most recent LHC from 02/2014 showed 20 to 30% proximal left main stenosis, mild luminal irregularities of the LAD, mild to moderate disease of the LCx up to 30%, and 40% ISR in the mid RCA with an LVEF greater than 65% and LVEDP 12 mmHg.  Medical management was recommended.  Most recent myocardial PET stress test from 11/2015 was low risk with a small in size, mild in severity, and nearly completely reversible defect involving the apical inferior and apical lateral segments consistent with probable artifact though cannot rule out subtle ischemia with an EF of 61%.  Most recent echo from 09/2015 was a technically difficult study with an EF of 55 to 60%, normal LV diastolic function, mildly dilated left atrium.  Patient has undergone his Zio patch monitoring in 11/2014 which showed a predominantly sinus rhythm with 6 runs of SVT lasting up to 9 beats with a maximal rate of 222 bpm along with occasional PACs representing a 3.7% burden.  He was last seen by his primary cardiology group in 10/2018 and noted an isolated episode of tachypalpitations lasting  approximately 30 minutes after hearing some unfortunate news.  Given this was an isolated event no further work-up was recommended at that time at the patient's request.  He presented to [facility] on 09/03/2019 after developing sudden onset of tachypalpitations early this morning waking him from sleep which felt similar to his prior episodes of A. fib.  Because the symptoms persisted he presented to the ED.  He denies any chest pain, dizziness, presyncope, syncope, diaphoresis, worsening lower extremity swelling, abdominal distention, orthopnea, PND, or early satiety.  He does note some increase shortness of breath with his palpitations.  He did to 4 baby aspirin prior to his arrival though again was not experiencing angina.  Upon his arrival he was noted to be in a narrow complex tachycardic rhythm, possibly atrial tachycardia.  High-sensitivity troponin of 6 with a delta of 4.  D-dimer elevated at 1165.60.  BNP 125.  BP stable.  Outside of tachypalpitations, patient is asymptomatic.  After cardiology evaluated the patient, IV was established with patient being given adenosine with strips to be reviewed.  He has subsequently been started on diltiazem gtt.     Past Medical History:  Diagnosis Date  . A-fib ([place]) 04/2011   a. s/p ablation x 2 w/o evi of recurrence   . Chronic diastolic CHF (congestive heart failure) ([place])    a. echo 2012: EF >55%, nl RVSP  . COPD (chronic obstructive pulmonary disease) ([place])   . Coronary artery disease    a. s/p stent in RCA in  1997 w/ ISR 2003 & 05/2013 s/p DES to mid RCA 05/2013; b. cardiac cath 02/2014: nonobstructive CAD with patent stent in RCA with 40% ISR, no evi of pulm HTN, nl LVEDP, EF >65%   . Diabetes mellitus (HCC)   . GERD (gastroesophageal reflux disease)   . Gout   . Hyperlipidemia   . Hypertension   . Morbid obesity (HCC)   . Obesity hypoventilation syndrome (HCC)   . OSA (obstructive sleep apnea)   . Wears hearing aid    right ear    Past Surgical  History:  Procedure Laterality Date  . ATRIAL ABLATION SURGERY     A-Fib  . BARIATRIC SURGERY    . CARDIAC CATHETERIZATION     s/p stent to RCA.   [name] CARDIAC CATHETERIZATION  2008   Dr. Juliann Pares @ [facility] .  [name] CHOLECYSTECTOMY  2012  . heart stint  2013  . HIP FRACTURE SURGERY    . STOMACH SURGERY     gastric sleeve     Home Meds: Prior to Admission medications   Medication Sig Start Date End Date Taking? Authorizing Provider  albuterol (PROVENTIL HFA;VENTOLIN HFA) 108 (90 Base) MCG/ACT inhaler Inhale 2 puffs into the lungs every 6 (six) hours as needed for wheezing or shortness of breath. 12/15/17   Merrily Brittle, MD  aspirin 81 MG tablet Take 81 mg by mouth daily. Take 162 mg daily.    [provider]  atorvastatin (LIPITOR) 40 MG tablet Take 1 tablet by mouth daily. 08/10/15   [provider]  colchicine 0.6 MG tablet Take 0.6 mg by mouth daily. 07/21/15   [provider]  cyclobenzaprine (FLEXERIL) 5 MG tablet Take 1 tablet (5 mg total) by mouth 3 (three) times daily as needed for muscle spasms. 09/02/18   Minna Antis, MD  diclofenac sodium (VOLTAREN) 1 % GEL Apply 2 g topically 4 (four) times daily. 08/22/16   Enid Derry, PA-C  gabapentin (NEURONTIN) 300 MG capsule Take 300 mg by mouth at bedtime.    [provider]  metoprolol succinate (TOPROL-XL) 25 MG 24 hr tablet Take 1 tablet by mouth daily. 07/26/15   [provider]  nitroGLYCERIN (NITROSTAT) 0.4 MG SL tablet Place 0.4 mg under the tongue every 5 (five) minutes as needed.  07/08/15   [provider]  oxyCODONE-acetaminophen (PERCOCET/ROXICET) 5-325 MG tablet Take 1-2 tablets by mouth every 6 (six) hours as needed for severe pain. 04/09/18   Sharyn Creamer, MD  predniSONE (DELTASONE) 10 MG tablet Take 6 tablets on day 1, take 5 tablets on day 2, take 4 tablets on day 3, take 3 tablets on day 4, take 2 tablets on day 5, take 1 tablet on day 6 03/26/18   Enid Derry, PA-C   Spacer/Aero Chamber Mouthpiece MISC 1 Units by Does not apply route every 4 (four) hours as needed (wheezing). 12/15/17   Merrily Brittle, MD  traZODone (DESYREL) 100 MG tablet Take 1.5 tablets by mouth daily. 08/19/15   [provider]    Inpatient Medications: Scheduled Meds:  Continuous Infusions: . diltiazem (CARDIZEM) infusion 5 mg/hr (09/03/19 1216)   PRN Meds:   Allergies:   Allergies  Allergen Reactions  . Erythromycin Hives    Social History:   Social History   Socioeconomic History  . Marital status: Divorced    Spouse name: Not on file  . Number of children: Not on file  . Years of education: Not on file  . Highest education level: Not  on file  Occupational History  . Not on file  Tobacco Use  . Smoking status: Former Smoker    Packs/day: 1.00    Years: 30.00    Pack years: 30.00    Types: Cigarettes    Quit date: 06/28/2003    Years since quitting: 16.1  . Smokeless tobacco: Never Used  Substance and Sexual Activity  . Alcohol use: No  . Drug use: No  . Sexual activity: Not on file  Other Topics Concern  . Not on file  Social History Narrative  . Not on file   Social Determinants of Health   Financial Resource Strain:   . Difficulty of Paying Living Expenses: Not on file  Food Insecurity:   . Worried About Programme researcher, broadcasting/film/video in the Last Year: Not on file  . Ran Out of Food in the Last Year: Not on file  Transportation Needs:   . Lack of Transportation (Medical): Not on file  . Lack of Transportation (Non-Medical): Not on file  Physical Activity:   . Days of Exercise per Week: Not on file  . Minutes of Exercise per Session: Not on file  Stress:   . Feeling of Stress : Not on file  Social Connections:   . Frequency of Communication with Friends and Family: Not on file  . Frequency of Social Gatherings with Friends and Family: Not on file  . Attends Religious Services: Not on file  . Active Member of Clubs or Organizations: Not on  file  . Attends Banker Meetings: Not on file  . Marital Status: Not on file  Intimate Partner Violence:   . Fear of Current or Ex-Partner: Not on file  . Emotionally Abused: Not on file  . Physically Abused: Not on file  . Sexually Abused: Not on file     Family History:   Family History  Problem Relation Age of Onset  . Heart disease Father   . Heart attack Father     ROS:  Review of Systems  Constitutional: Positive for malaise/fatigue. Negative for chills, diaphoresis, fever and weight loss.  HENT: Negative for congestion.   Eyes: Negative for discharge and redness.  Respiratory: Positive for shortness of breath. Negative for cough, hemoptysis, sputum production and wheezing.   Cardiovascular: Positive for palpitations and leg swelling. Negative for chest pain, orthopnea, claudication and PND.  Gastrointestinal: Negative for abdominal pain, blood in stool, heartburn, melena, nausea and vomiting.  Genitourinary: Negative for hematuria.  Musculoskeletal: Negative for falls and myalgias.  Skin: Negative for rash.  Neurological: Positive for weakness. Negative for dizziness, tingling, tremors, sensory change, speech change, focal weakness and loss of consciousness.  Endo/Heme/Allergies: Does not bruise/bleed easily.  Psychiatric/Behavioral: Negative for substance abuse. The patient is not nervous/anxious.   All other systems reviewed and are negative.     Physical Exam/Data:   Vitals:   09/03/19 1022 09/03/19 1030 09/03/19 1100 09/03/19 1200  BP: 117/77 129/87 101/85 (!) 163/104  Pulse: (!) 130 (!) 129 (!) 130 (!) 129  Resp: (!) Temp:      TempSrc:      SpO2: 100% 99% 100% 100%  Weight:      Height:       No intake or output data in the 24 hours ending 09/03/19 1249 Filed Weights   09/03/19 0949  Weight: 136.1 kg   Body mass index is 49.92 kg/m.   Physical Exam: General: Well developed, well nourished, in no  acute distress. Head:  Normocephalic, atraumatic, sclera non-icteric, no xanthomas, nares without discharge.  Neck: Negative for carotid bruits. JVD not elevated. Lungs: Clear bilaterally to auscultation without wheezes, rales, or rhonchi. Breathing is unlabored. Heart: Tachycardic with S1 S2. No murmurs, rubs, or gallops appreciated. Abdomen: Soft, non-tender, non-distended with normoactive bowel sounds. No hepatomegaly. No rebound/guarding. No obvious abdominal masses. Msk:  Strength and tone appear normal for age. Extremities: No clubbing or cyanosis.  Trace to 1+ bilateral lower extremity pitting edema with changes consistent with chronic woody edema. Distal pedal pulses are 2+ and equal bilaterally. Neuro: Alert and oriented X 3. No facial asymmetry. No focal deficit. Moves all extremities spontaneously. Psych:  Responds to questions appropriately with a normal affect.   EKG:  The EKG was personally reviewed and demonstrates: EKG 9:44 - atrial tachycardia, 129 bpm, nonspecific ST-T changes.  EKG 12:01 - atrial tachycardia, 128 bpm, nonspecific ST-T changes Telemetry:  Telemetry was personally reviewed and demonstrates: Narrow complex tachycardia with rates ranging from 128 to 130 bpm, short episode of coarse atrial flutter following adenosine push  Weights: Filed Weights   09/03/19 0949  Weight: 136.1 kg    Relevant CV Studies: Cardiovascular Studies: Cath / PCI:  LHC (02/24/14): Nonobstructive CAD with 20-30% proximal LMCA, mild luminal irregularities of the LAD, mild to moderate disease of the LCx up to 30%, and 40% in-stent restenosis of mid RCA. LVEF is greater than 65% with LVEDP of 12 mmHg.  RHC (02/24/14): RA mean 8, RV 25/10, PA 27/15 (mean 21), PCWP 10 mmHg. SVO 267%. Fick CO 7.7 L/min, Fick CI 3.0 L/min/m; PVR 1.4 Wood Units.  LHC/PCI (05/27/13): LMCA 30% distal, LAD 40% mid, T1 40%, LCx with mild luminal irregularity, RCA with 95% mid in-stent restenosis and 30% distal RCA disease. Proximal RCA  stent is patent. Mid RCA was treated with 3.0 x 32 mm Promus drug-eluting stent.  Multiple left heart catheterizations and PCI's at [facility] and [facility].  EP Procedures and Devices:  Atrial fibrillation ablation 2 in 12/2008 and 04/2011 (Dr. Herbert DeanerGehi)  Zio Patch 11/2014: Predominantly sinus rhyhtm with 6 runs of SVT up to 9 beats with max HR of 222 bpm. Occasional PAC's noted (3.7%).   Non-Invasive Evaluation(s):  TTE (09/2015): Procedure narrative: Transthoracic echocardiography. The study was technically difficult.- Left ventricle: The cavity size was normal. There was moderate concentric hypertrophy. Systolic function was normal. The estimated ejection fraction was in the range of 55% to 60%. Left ventricular diastolic function parameters were normal. - Left atrium: The atrium was mildly dilated.  TTE (07/2015): Non-diagnostic study.   TTE (12/26/13): Technically difficult study with probably normal LV systolic function. Mild left and right atrial dilation. Probably normal RV contraction. Question trivial pericardial effusion versus subendocardial fat.  TTE (03/27/09): Normal biventricular systolic function.  Myocardial PET stress (01/11/12): No evidence of significant ischemia or scar. LVEF is 60%.  Myocardial PET stress (11/2015): Low risk study. There is a small in size, mild in severity, nearly completely reversible defect involving the apical inferior and apical lateral segments. This is consistent with probable artifact (misregistration artifact and attenuation) but, cannot rule out subtle ischemia. EF 61%, RV dilated   Laboratory Data:  Chemistry Recent Labs  Lab 09/03/19 0955  NA 138  K 4.4  CL 100  CO2 25  GLUCOSE 142*  BUN 26*  CREATININE 1.37*  CALCIUM 9.1  GFRNONAA 53*  GFRAA >60  ANIONGAP 13    No results for input(s): PROT, ALBUMIN, AST, ALT,  ALKPHOS, BILITOT in the last 168 hours. Hematology Recent Labs  Lab 09/03/19 0955  WBC 6.6  RBC 4.38  HGB 13.2    HCT 42.2  MCV 96.3  MCH 30.1  MCHC 31.3  RDW 14.5  PLT 249   Cardiac EnzymesNo results for input(s): TROPONINI in the last 168 hours. No results for input(s): TROPIPOC in the last 168 hours.  BNP Recent Labs  Lab 09/03/19 0955  BNP 125.0*    DDimer No results for input(s): DDIMER in the last 168 hours.  Radiology/Studies:  DG Chest 2 View  Result Date: 09/03/2019 IMPRESSION: Stable cardiac enlargement. Chronic bronchitic and probable emphysematous changes but no acute pulmonary findings. Electronically Signed   By: Rudie Meyer M.D.   On: 09/03/2019 10:27    Assessment and Plan:   1. Atrial flutter with RVR: -Presented with narrow complex tachycardia possibly atrial tach vs flutter -Short episode noted flutter waves on telemetry following adenosine push  -Diltiazem gtt -Potassium at goal -Check TSH and magnesium -Upon improved heart rate, plan for echo -CHADS2VASc at least 5 (CHF, HTN, DM, age x 1, vascular disease) -Start heparin gtt -Plan to transition to DOAC prior to discharge with the discontinuation of ASA to minimize bleeding risk -If he has difficult to control ventricular rates, he will need a TEE-guided DCCV prior to discharge -Will make NPO at midnight in case he needs a TEE/DCCV on 12/23  2. CAD involving the native coronary arteries without angina: -HS-Tn negative x 2 -Check echo as above -Stop ASA as above  3. HFpEF: -He does not appear grossly volume up, though volume status is somewhat difficult given his body habitus  -He indicates his lower extremity swelling is at baseline  -Would hold PTA Lasix for a day, and resume 12/23 pending renal function  4. AKI: -More recently, his SCr has been 0.9-1.1 -Recommend holding Lasix for today   For questions or updates, please contact [facility] Please consult [email or web address] for contact info under Cardiology/STEMI.   Signed, Eula Listen, PA-C [facility] Pager: [phone] 09/03/2019, 12:49  PM

## 2019-09-03 NOTE — ED Notes (Signed)
6mg  adenosine given. EDP Quale at bedside. HR reduced initially but immediately returned to 120-130s. EKG done. Continuous rhythm strip printed by EDP from [place] while adenosine given.

## 2019-09-03 NOTE — ED Notes (Signed)
Pt refused lunch tray 

## 2019-09-03 NOTE — ED Notes (Signed)
Notified by lab that pt needs new blue top for PTT/PT-INR as last tube is too old to use. New sample sent.

## 2019-09-03 NOTE — ED Triage Notes (Signed)
Pt c/o having heart palpitations since around 3am, states he has a hx of a-fib with cardiac ablation several years ago and states "I feel like im in a-fib again". States he is having SOB with the discomfort.

## 2019-09-03 NOTE — Consult Note (Signed)
ANTICOAGULATION CONSULT NOTE - Initial Consult  Pharmacy Consult for Heparin Infusion Indication: atrial fibrillation  Allergies  Allergen Reactions  . Erythromycin Hives    Patient Measurements: Height: 5\' 5"  (165.1 cm) Weight: 300 lb (136.1 kg) IBW/kg (Calculated) : 61.5 Heparin Dosing Weight: 94.6 kg  Vital Signs: Temp: 97.7 F (36.5 C) (12/22 0948) Temp Source: Oral (12/22 0948) BP: 113/82 (12/22 1403) Pulse Rate: 134 (12/22 1403)  Labs: Recent Labs    09/03/19 0955 09/03/19 1218  HGB 13.2  --   HCT 42.2  --   PLT 249  --   CREATININE 1.37*  --   TROPONINIHS 6 4    Estimated Creatinine Clearance: 66.6 mL/min (A) (by C-G formula based on SCr of 1.37 mg/dL (H)).   Medical History: Past Medical History:  Diagnosis Date  . A-fib ([place]) 04/2011   a. s/p ablation x 2 w/o evi of recurrence   . Chronic diastolic CHF (congestive heart failure) ([place])    a. echo 2012: EF >55%, nl RVSP  . COPD (chronic obstructive pulmonary disease) ([place])   . Coronary artery disease    a. s/p stent in RCA in 1997 w/ ISR 2003 & 05/2013 s/p DES to mid RCA 05/2013; b. cardiac cath 02/2014: nonobstructive CAD with patent stent in RCA with 40% ISR, no evi of pulm HTN, nl LVEDP, EF >65%   . Diabetes mellitus ([place])   . GERD (gastroesophageal reflux disease)   . Gout   . Hyperlipidemia   . Hypertension   . Morbid obesity ([place])   . Obesity hypoventilation syndrome ([place])   . OSA (obstructive sleep apnea)   . Wears hearing aid    right ear    Medications:  (Not in a hospital admission)  Scheduled:  . heparin  5,500 Units Intravenous Once   Infusions:  . diltiazem (CARDIZEM) infusion 15 mg/hr (09/03/19 1333)  . heparin     PRN: acetaminophen **OR** acetaminophen, bisacodyl, magnesium citrate, ondansetron **OR** ondansetron (ZOFRAN) IV, polyethylene glycol, traZODone Anti-infectives (From admission, onward)   None      Assessment: Pharmacy has been consulted to initiate Heparin  Infusion in 68yo patient presenting with racing heart. Patient was previously followed by Dr. Rockey Situ though more recently has established with [facility]. Has a history of CAD status post PCI to the proximal RCA in 8299 complicated by ISR in 3716 and 2014 status post PCI/DES to the mid RCA in September 2014. Also has persistent Atrial fibrillation post prior ablation x2 in 2010 and 2012.   Patient has no prior history of anticoagulation use PTA. On diltiazem drip currently. Plan to transition to Grover Schar when appropriate. Baseline labs have been ordered and are pending.  Goal of Therapy:  Heparin level 0.3-0.7 units/ml Monitor platelets by anticoagulation protocol: Yes   Plan:  Give 5500 units bolus x 1 Start heparin infusion at 1400 units/hr Check anti-Xa level in 6 hours and daily while on heparin Continue to monitor H&H and platelets  Payten Beaumier A Durrell Barajas 09/03/2019,2:42 PM

## 2019-09-03 NOTE — Consult Note (Signed)
ANTICOAGULATION CONSULT NOTE - Initial Consult  Pharmacy Consult for Heparin Infusion Indication: atrial fibrillation  Allergies  Allergen Reactions  . Erythromycin Hives and Anaphylaxis  . Cyclobenzaprine Palpitations    Triggered a fib    Patient Measurements: Height: 5\' 5"  (165.1 cm) Weight: 300 lb (136.1 kg) IBW/kg (Calculated) : 61.5 Heparin Dosing Weight: 94.6 kg  Vital Signs: Temp: 98.4 F (36.9 C) (12/22 2021) Temp Source: Oral (12/22 2021) BP: 108/88 (12/22 2133) Pulse Rate: 128 (12/22 2133)  Labs: Recent Labs    09/03/19 0955 09/03/19 1218 09/03/19 1925 09/03/19 2123  HGB 13.2  --   --   --   HCT 42.2  --   --   --   PLT 249  --   --   --   APTT  --   --  65*  --   LABPROT  --   --  13.3  --   INR  --   --  1.0  --   HEPARINUNFRC  --   --   --  0.18*  CREATININE 1.37*  --   --   --   TROPONINIHS 6 4  --   --     Estimated Creatinine Clearance: 66.6 mL/min (A) (by C-G formula based on SCr of 1.37 mg/dL (H)).   Medical History: Past Medical History:  Diagnosis Date  . A-fib (HCC) 04/2011   a. s/p ablation x 2 w/o evi of recurrence   . Chronic diastolic CHF (congestive heart failure) (HCC)    a. echo 2012: EF >55%, nl RVSP  . COPD (chronic obstructive pulmonary disease) (HCC)   . Coronary artery disease    a. s/p stent in RCA in 1997 w/ ISR 2003 & 05/2013 s/p DES to mid RCA 05/2013; b. cardiac cath 02/2014: nonobstructive CAD with patent stent in RCA with 40% ISR, no evi of pulm HTN, nl LVEDP, EF >65%   . Diabetes mellitus (HCC)   . GERD (gastroesophageal reflux disease)   . Gout   . Hyperlipidemia   . Hypertension   . Morbid obesity (HCC)   . Obesity hypoventilation syndrome (HCC)   . OSA (obstructive sleep apnea)   . Wears hearing aid    right ear    Medications:  Medications Prior to Admission  Medication Sig Dispense Refill Last Dose  . acetaminophen (TYLENOL) 500 MG tablet Take 1,000 mg by mouth every 4 (four) hours as needed for pain.    09/03/2019 at 0800  . allopurinol (ZYLOPRIM) 300 MG tablet Take 300 mg by mouth daily.   09/03/2019 at 0800  . ALPRAZolam (XANAX) 0.25 MG tablet Take 0.25 mg by mouth 2 (two) times daily as needed for sleep.   09/02/2019 at 2000  . aspirin 81 MG EC tablet Take 81 mg by mouth daily.   09/03/2019 at 0800  . atorvastatin (LIPITOR) 40 MG tablet Take 1 tablet by mouth daily.   09/02/2019 at 2000  . colchicine 0.6 MG tablet Take 0.6 mg by mouth daily.   09/02/2019 at 0800  . furosemide (LASIX) 40 MG tablet Take 40 mg by mouth 2 (two) times daily as needed for edema.   09/03/2019 at 0800  . gabapentin (NEURONTIN) 300 MG capsule Take 300 mg by mouth at bedtime.   09/02/2019 at 2000  . lisinopril (ZESTRIL) 20 MG tablet Take 20 mg by mouth 2 (two) times daily.   09/03/2019 at 0800  . loratadine (CLARITIN) 10 MG tablet Take 10 mg by mouth at  bedtime.   09/02/2019 at 2000  . meloxicam (MOBIC) 7.5 MG tablet Take 7.5 mg by mouth 2 (two) times daily.   09/03/2019 at 0800  . metoprolol tartrate (LOPRESSOR) 50 MG tablet Take 75 mg by mouth 2 (two) times daily.   09/03/2019 at 0800  . tiZANidine (ZANAFLEX) 2 MG tablet Take 2 mg by mouth every 8 (eight) hours as needed.   09/02/2019 at 2000  . traMADol (ULTRAM) 50 MG tablet Take 50 mg by mouth every 8 (eight) hours as needed for pain.   09/02/2019 at 2000  . traZODone (DESYREL) 50 MG tablet Take 50 mg by mouth at bedtime.   09/02/2019 at 2000  . nitroGLYCERIN (NITROSTAT) 0.4 MG SL tablet Place 0.4 mg under the tongue every 5 (five) minutes as needed.    prn at prn   Scheduled:  . [START ON 09/04/2019] allopurinol  300 mg Oral Daily  . [START ON 09/04/2019] aspirin  81 mg Oral Daily  . atorvastatin  40 mg Oral Daily  . gabapentin  300 mg Oral QHS  . heparin  2,800 Units Intravenous Once  . [START ON 09/04/2019] influenza vaccine adjuvanted  0.5 mL Intramuscular Tomorrow-1000  . lisinopril  20 mg Oral BID  . loratadine  10 mg Oral QHS  . metoprolol tartrate  25  mg Oral Q6H  . [START ON 09/04/2019] pneumococcal 23 valent vaccine  0.5 mL Intramuscular Tomorrow-1000  . traZODone  50 mg Oral QHS   Infusions:  . diltiazem (CARDIZEM) infusion 15 mg/hr (09/03/19 1917)  . heparin 1,400 Units/hr (09/03/19 1556)   PRN: acetaminophen **OR** acetaminophen, ALPRAZolam, bisacodyl, magnesium citrate, nitroGLYCERIN, ondansetron **OR** ondansetron (ZOFRAN) IV, polyethylene glycol, tiZANidine, traMADol, traZODone Anti-infectives (From admission, onward)   None      Assessment: Pharmacy has been consulted to initiate Heparin Infusion in 68yo patient presenting with racing heart. Patient was previously followed by Dr. Rockey Situ though more recently has established with [facility]. Has a history of CAD status post PCI to the proximal RCA in 1610 complicated by ISR in 9604 and 2014 status post PCI/DES to the mid RCA in September 2014. Also has persistent Atrial fibrillation post prior ablation x2 in 2010 and 2012.   Patient has no prior history of anticoagulation use PTA. On diltiazem drip currently. Plan to transition to [place] when appropriate. Baseline labs have been ordered and are pending.  Goal of Therapy:  Heparin level 0.3-0.7 units/ml Monitor platelets by anticoagulation protocol: Yes   Plan:  Give 5500 units bolus x 1 Start heparin infusion at 1400 units/hr Check anti-Xa level in 6 hours and daily while on heparin Continue to monitor H&H and platelets  12/22:  HL @ 2123 = 0.18 Will order Heparin 2800 units IV X 1 bolus and increase drip rate to 1700 units/hr. Will recheck HL 6 hrs after rate change.   Rosemary Pentecost D 09/03/2019,10:05 PM

## 2019-09-04 ENCOUNTER — Inpatient Hospital Stay (HOSPITAL_COMMUNITY)
Admit: 2019-09-04 | Discharge: 2019-09-04 | Disposition: A | Discharge status: Home / Self Care | Payer: Medicare Other | Attending: Physician Assistant | Admitting: Physician Assistant

## 2019-09-04 DIAGNOSIS — I25118 Atherosclerotic heart disease of native coronary artery with other forms of angina pectoris: Secondary | ICD-10-CM | POA: Diagnosis not present

## 2019-09-04 DIAGNOSIS — I4891 Unspecified atrial fibrillation: Secondary | ICD-10-CM | POA: Diagnosis not present

## 2019-09-04 DIAGNOSIS — E119 Type 2 diabetes mellitus without complications: Secondary | ICD-10-CM | POA: Diagnosis present

## 2019-09-04 DIAGNOSIS — R002 Palpitations: Secondary | ICD-10-CM | POA: Diagnosis present

## 2019-09-04 DIAGNOSIS — M109 Gout, unspecified: Secondary | ICD-10-CM | POA: Diagnosis not present

## 2019-09-04 DIAGNOSIS — I952 Hypotension due to drugs: Secondary | ICD-10-CM | POA: Diagnosis not present

## 2019-09-04 DIAGNOSIS — I700 Atherosclerosis of aorta: Secondary | ICD-10-CM | POA: Diagnosis present

## 2019-09-04 DIAGNOSIS — E782 Mixed hyperlipidemia: Secondary | ICD-10-CM | POA: Diagnosis not present

## 2019-09-04 DIAGNOSIS — E1159 Type 2 diabetes mellitus with other circulatory complications: Secondary | ICD-10-CM | POA: Diagnosis not present

## 2019-09-04 DIAGNOSIS — N179 Acute kidney failure, unspecified: Secondary | ICD-10-CM | POA: Diagnosis present

## 2019-09-04 DIAGNOSIS — Z20828 Contact with and (suspected) exposure to other viral communicable diseases: Secondary | ICD-10-CM | POA: Diagnosis present

## 2019-09-04 DIAGNOSIS — I5031 Acute diastolic (congestive) heart failure: Secondary | ICD-10-CM | POA: Diagnosis not present

## 2019-09-04 DIAGNOSIS — T411X5A Adverse effect of intravenous anesthetics, initial encounter: Secondary | ICD-10-CM | POA: Diagnosis not present

## 2019-09-04 DIAGNOSIS — G4733 Obstructive sleep apnea (adult) (pediatric): Secondary | ICD-10-CM | POA: Diagnosis not present

## 2019-09-04 DIAGNOSIS — Z23 Encounter for immunization: Secondary | ICD-10-CM | POA: Diagnosis present

## 2019-09-04 DIAGNOSIS — T502X5A Adverse effect of carbonic-anhydrase inhibitors, benzothiadiazides and other diuretics, initial encounter: Secondary | ICD-10-CM | POA: Diagnosis present

## 2019-09-04 DIAGNOSIS — I484 Atypical atrial flutter: Secondary | ICD-10-CM | POA: Diagnosis present

## 2019-09-04 DIAGNOSIS — I2510 Atherosclerotic heart disease of native coronary artery without angina pectoris: Secondary | ICD-10-CM | POA: Diagnosis present

## 2019-09-04 DIAGNOSIS — R0602 Shortness of breath: Secondary | ICD-10-CM

## 2019-09-04 DIAGNOSIS — I4892 Unspecified atrial flutter: Secondary | ICD-10-CM | POA: Diagnosis not present

## 2019-09-04 DIAGNOSIS — E785 Hyperlipidemia, unspecified: Secondary | ICD-10-CM | POA: Diagnosis present

## 2019-09-04 DIAGNOSIS — I483 Typical atrial flutter: Secondary | ICD-10-CM | POA: Diagnosis not present

## 2019-09-04 DIAGNOSIS — J9601 Acute respiratory failure with hypoxia: Secondary | ICD-10-CM | POA: Diagnosis not present

## 2019-09-04 DIAGNOSIS — E213 Hyperparathyroidism, unspecified: Secondary | ICD-10-CM | POA: Diagnosis present

## 2019-09-04 DIAGNOSIS — I4819 Other persistent atrial fibrillation: Secondary | ICD-10-CM | POA: Diagnosis present

## 2019-09-04 DIAGNOSIS — Z6841 Body Mass Index (BMI) 40.0 and over, adult: Secondary | ICD-10-CM | POA: Diagnosis not present

## 2019-09-04 DIAGNOSIS — I071 Rheumatic tricuspid insufficiency: Secondary | ICD-10-CM | POA: Diagnosis present

## 2019-09-04 DIAGNOSIS — I5033 Acute on chronic diastolic (congestive) heart failure: Secondary | ICD-10-CM | POA: Diagnosis present

## 2019-09-04 DIAGNOSIS — Y92230 Patient room in hospital as the place of occurrence of the external cause: Secondary | ICD-10-CM | POA: Diagnosis not present

## 2019-09-04 DIAGNOSIS — J810 Acute pulmonary edema: Secondary | ICD-10-CM | POA: Diagnosis not present

## 2019-09-04 DIAGNOSIS — J432 Centrilobular emphysema: Secondary | ICD-10-CM

## 2019-09-04 DIAGNOSIS — J449 Chronic obstructive pulmonary disease, unspecified: Secondary | ICD-10-CM | POA: Diagnosis present

## 2019-09-04 DIAGNOSIS — I361 Nonrheumatic tricuspid (valve) insufficiency: Secondary | ICD-10-CM | POA: Diagnosis not present

## 2019-09-04 DIAGNOSIS — J309 Allergic rhinitis, unspecified: Secondary | ICD-10-CM | POA: Diagnosis present

## 2019-09-04 DIAGNOSIS — I10 Essential (primary) hypertension: Secondary | ICD-10-CM | POA: Diagnosis not present

## 2019-09-04 DIAGNOSIS — I441 Atrioventricular block, second degree: Secondary | ICD-10-CM | POA: Diagnosis present

## 2019-09-04 DIAGNOSIS — I471 Supraventricular tachycardia: Secondary | ICD-10-CM | POA: Diagnosis present

## 2019-09-04 DIAGNOSIS — E662 Morbid (severe) obesity with alveolar hypoventilation: Secondary | ICD-10-CM | POA: Diagnosis present

## 2019-09-04 DIAGNOSIS — I872 Venous insufficiency (chronic) (peripheral): Secondary | ICD-10-CM | POA: Diagnosis present

## 2019-09-04 DIAGNOSIS — I110 Hypertensive heart disease with heart failure: Secondary | ICD-10-CM | POA: Diagnosis present

## 2019-09-04 LAB — CBC
HCT: 37.9 % — ABNORMAL LOW (ref 39.0–52.0)
Hemoglobin: 12.7 g/dL — ABNORMAL LOW (ref 13.0–17.0)
MCH: 30 pg (ref 26.0–34.0)
MCHC: 33.5 g/dL (ref 30.0–36.0)
MCV: 89.6 fL (ref 80.0–100.0)
Platelets: 268 10*3/uL (ref 150–400)
RBC: 4.23 MIL/uL (ref 4.22–5.81)
RDW: 14.3 % (ref 11.5–15.5)
WBC: 9.4 10*3/uL (ref 4.0–10.5)
nRBC: 0 % (ref 0.0–0.2)

## 2019-09-04 LAB — BASIC METABOLIC PANEL
Anion gap: 12 (ref 5–15)
BUN: 20 mg/dL (ref 8–23)
CO2: 24 mmol/L (ref 22–32)
Calcium: 8.9 mg/dL (ref 8.9–10.3)
Chloride: 100 mmol/L (ref 98–111)
Creatinine, Ser: 1.23 mg/dL (ref 0.61–1.24)
GFR calc Af Amer: >60 mL/min (ref >60)
GFR calc non Af Amer: 60 mL/min — ABNORMAL LOW (ref >60)
Glucose, Bld: 170 mg/dL — ABNORMAL HIGH (ref 70–99)
Potassium: 4.4 mmol/L (ref 3.5–5.1)
Sodium: 136 mmol/L (ref 135–145)

## 2019-09-04 LAB — ECHOCARDIOGRAM COMPLETE
Height: 65 in
Weight: 5208 oz

## 2019-09-04 LAB — MAGNESIUM: Magnesium: 1.9 mg/dL (ref 1.7–2.4)

## 2019-09-04 LAB — HIV ANTIBODY (ROUTINE TESTING W REFLEX): HIV Screen 4th Generation wRfx: NONREACTIVE

## 2019-09-04 MED ORDER — FUROSEMIDE 10 MG/ML IJ SOLN
40.0000 mg | Freq: Once | INTRAMUSCULAR | Status: AC
  Administered 2019-09-04: 40 mg via INTRAVENOUS
  Filled 2019-09-04: qty 4

## 2019-09-04 MED ORDER — APIXABAN 5 MG PO TABS
5.0000 mg | ORAL_TABLET | Freq: Two times a day (BID) | ORAL | Status: DC
  Administered 2019-09-04 – 2019-09-12 (×17): 5 mg via ORAL
  Filled 2019-09-04 (×17): qty 1

## 2019-09-04 MED ORDER — SIMETHICONE 80 MG PO CHEW
80.0000 mg | CHEWABLE_TABLET | Freq: Four times a day (QID) | ORAL | Status: DC | PRN
  Filled 2019-09-04: qty 1

## 2019-09-04 MED ORDER — DIGOXIN 0.25 MG/ML IJ SOLN
0.2500 mg | Freq: Two times a day (BID) | INTRAMUSCULAR | Status: AC
  Administered 2019-09-04 (×2): 0.25 mg via INTRAVENOUS
  Filled 2019-09-04 (×2): qty 2

## 2019-09-04 MED ORDER — CHLORHEXIDINE GLUCONATE CLOTH 2 % EX PADS
6.0000 | MEDICATED_PAD | Freq: Every day | CUTANEOUS | Status: DC
  Administered 2019-09-04 – 2019-09-07 (×3): 6 via TOPICAL

## 2019-09-04 MED ORDER — POTASSIUM CHLORIDE CRYS ER 10 MEQ PO TBCR
10.0000 meq | EXTENDED_RELEASE_TABLET | Freq: Two times a day (BID) | ORAL | Status: AC
  Administered 2019-09-04 (×2): 10 meq via ORAL
  Filled 2019-09-04 (×2): qty 1

## 2019-09-04 MED ORDER — METOPROLOL TARTRATE 25 MG PO TABS
25.0000 mg | ORAL_TABLET | Freq: Four times a day (QID) | ORAL | Status: DC
  Filled 2019-09-04: qty 1

## 2019-09-04 MED ORDER — METOPROLOL TARTRATE 5 MG/5ML IV SOLN
5.0000 mg | INTRAVENOUS | Status: DC | PRN
  Administered 2019-09-04 – 2019-09-08 (×10): 5 mg via INTRAVENOUS
  Filled 2019-09-04 (×13): qty 5

## 2019-09-04 MED ORDER — METOPROLOL SUCCINATE ER 100 MG PO TB24
100.0000 mg | ORAL_TABLET | Freq: Every day | ORAL | Status: DC
  Administered 2019-09-04 – 2019-09-07 (×4): 100 mg via ORAL
  Filled 2019-09-04 (×4): qty 1

## 2019-09-04 MED ORDER — FUROSEMIDE 10 MG/ML IJ SOLN
20.0000 mg | Freq: Two times a day (BID) | INTRAMUSCULAR | Status: AC
  Administered 2019-09-04 (×2): 20 mg via INTRAVENOUS
  Filled 2019-09-04 (×2): qty 2

## 2019-09-04 MED ORDER — METOPROLOL SUCCINATE ER 100 MG PO TB24: 100.0000 mg | ORAL_TABLET | Freq: Every evening | ORAL | Status: DC

## 2019-09-04 MED ORDER — DILTIAZEM HCL ER COATED BEADS 180 MG PO CP24
300.0000 mg | ORAL_CAPSULE | Freq: Every day | ORAL | Status: DC
  Administered 2019-09-04 – 2019-09-08 (×5): 300 mg via ORAL
  Filled 2019-09-04 (×5): qty 1

## 2019-09-04 NOTE — Progress Notes (Addendum)
Pt continues to refuse CPAP. CPAP remains at bedside. Pt aware I can assist him with placing CPAP on at anytime if he changes his mind.  Placed pt on CPAP at 0055.

## 2019-09-04 NOTE — TOC Progression Note (Signed)
Transition of Care [facility]) - Progression Note    Patient Details  Name: Aaron Ball MRN: [id number] Date of Birth: 1951-06-06  Transition of Care [facility]) CM/SW [place], RN Phone Number: 09/04/2019, 3:56 PM  Clinical Narrative:     Met with patient to discuss discharge planning and assessment of home services.  Patient reports he lives at home by self.  DME reported using large walker in most of home, skinny walker to get to bedroom.  Reports having two "risers" on toilet, Shower seat and grab bars.  Planning to have ramp built by [employer] group, they are waiting on funding for lumber to start project.  Mentioned recommendation for [facility] PT, patient is agreeable.  [place] services arranged and will be provided by [place]   Expected Discharge Plan: [place] Barriers to Discharge: Continued Medical Work up  Expected Discharge Plan and Services Expected Discharge Plan: [place]   Discharge Planning Services: CM Consult   Living arrangements for the past 2 months: Mobile Home                 DME Arranged: N/A DME Agency: NA       HH Arranged: PT [place] Agency: [place] ([place]) Date HH Agency Contacted: 09/04/19 Time Rafael Capo: 1550 Representative spoke with at [place]: [place] ([place]) Interventions    Readmission Risk Interventions No flowsheet data found.

## 2019-09-04 NOTE — Evaluation (Signed)
Physical Therapy Evaluation Patient Details Name: Aaron Ball MRN: [id number] DOB: Mar 22, 1951 Today's Date: 09/04/2019   History of Present Illness  Patient is a 68 year old male admitted with atrial tachycardia. PMH includes: obesity, CAD, HLD, HTN, COPD, DM, chronic back pain.  Clinical Impression  Patient received in bed, reports he is tired. Cannot get comfortable here. HR 112 upon arrival. After checking with RN, patient agrees to get up to recliner. No assistance needed for bed mobility or transfers. During ambulation patient's HR increased to 147 and he reports sob. O2 saturations remained within the mid 90%s. Patient will continue to benefit from skilled PT while here to improve functional independence and to increase activity tolerance.      Follow Up Recommendations Home health PT;Supervision - Intermittent    Equipment Recommendations  None recommended by PT    Recommendations for Other Services       Precautions / Restrictions Precautions Precautions: Fall Restrictions Weight Bearing Restrictions: No      Mobility  Bed Mobility Overal bed mobility: Modified Independent             General bed mobility comments: use of rails  Transfers Overall transfer level: Needs assistance Equipment used: None Transfers: Sit to/from Stand Sit to Stand: Supervision            Ambulation/Gait Ambulation/Gait assistance: Supervision;Min guard Gait Distance (Feet): 15 Feet Assistive device: None Gait Pattern/deviations: WFL(Within Functional Limits);Step-through pattern Gait velocity: WFL   General Gait Details: patient reaching for bed during mobility  Stairs            Wheelchair Mobility    Modified Rankin (Stroke Patients Only)       Balance Overall balance assessment: Modified Independent;Mild deficits observed, not formally tested                                           Pertinent Vitals/Pain Pain Assessment:  No/denies pain    Home Living Family/patient expects to be discharged to:: Private residence Living Arrangements: Alone   Type of Home: House         Home Equipment: [place] - single point      Prior Function Level of Independence: Independent with assistive device(s)         Comments: patient having a difficult time hearing me, answering questions re: PFL     Hand Dominance        Extremity/Trunk Assessment   Upper Extremity Assessment Upper Extremity Assessment: Overall WFL for tasks assessed    Lower Extremity Assessment Lower Extremity Assessment: Generalized weakness    Cervical / Trunk Assessment Cervical / Trunk Assessment: Normal  Communication   Communication: HOH  Cognition Arousal/Alertness: Awake/alert Behavior During Therapy: WFL for tasks assessed/performed Overall Cognitive Status: Within Functional Limits for tasks assessed                                        General Comments      Exercises     Assessment/Plan    PT Assessment Patient needs continued PT services  PT Problem List Decreased strength;Decreased activity tolerance;Decreased mobility;Cardiopulmonary status limiting activity       PT Treatment Interventions Therapeutic activities;Gait training;Therapeutic exercise;Functional mobility training;Stair training;Balance training;Neuromuscular re-education;Patient/family education    PT Goals (Current goals can  be found in the Care Plan section)  Acute Rehab PT Goals Patient Stated Goal: none stated PT Goal Formulation: With patient Time For Goal Achievement: 09/11/19 Potential to Achieve Goals: Good    Frequency Min 2X/week   Barriers to discharge Decreased caregiver support      Co-evaluation               AM-PAC PT "6 Clicks" Mobility  Outcome Measure Help needed turning from your back to your side while in a flat bed without using bedrails?: None Help needed moving from lying on your back to  sitting on the side of a flat bed without using bedrails?: None Help needed moving to and from a bed to a chair (including a wheelchair)?: A Little Help needed standing up from a chair using your arms (e.g., wheelchair or bedside chair)?: A Little Help needed to walk in hospital room?: A Little Help needed climbing 3-5 steps with a railing? : A Lot 6 Click Score: 19    End of Session Equipment Utilized During Treatment: Gait belt Activity Tolerance: Treatment limited secondary to medical complications (Comment)(Atrial tachy) Patient left: in chair;with call bell/phone within reach;with nursing/sitter in room Nurse Communication: Mobility status PT Visit Diagnosis: Muscle weakness (generalized) (M62.81);Difficulty in walking, not elsewhere classified (R26.2)    Time: 0315-9458 PT Time Calculation (min) (ACUTE ONLY): 16 min   Charges:   PT Evaluation $PT Eval Moderate Complexity: 1 Mod PT Treatments $Gait Training: 8-22 mins        Montario Zilka, PT, GCS 09/04/19,12:57 PM

## 2019-09-04 NOTE — Progress Notes (Signed)
*  PRELIMINARY RESULTS* Echocardiogram 2D Echocardiogram has been performed.  Aaron Ball 09/04/2019, 2:05 PM

## 2019-09-04 NOTE — Plan of Care (Signed)

## 2019-09-04 NOTE — Progress Notes (Signed)
Progress Note  Patient Name: Lavina HammanWilliam Toney Kukla Date of Encounter: 09/04/2019  [facility]  Subjective   Feeling a little better this morning. Remains in atrial flutter with variable AV block as well as intermittent tachycardic episodes. Dyspneic with ambulation. He was given IV Lasix overnight with dyspnea going to the bedside commode. No chest pain.   Inpatient Medications    Scheduled Meds: . allopurinol  300 mg Oral Daily  . aspirin  81 mg Oral Daily  . atorvastatin  40 mg Oral Daily  . Chlorhexidine Gluconate Cloth  6 each Topical Daily  . gabapentin  300 mg Oral QHS  . influenza vaccine adjuvanted  0.5 mL Intramuscular Tomorrow-1000  . lisinopril  20 mg Oral BID  . loratadine  10 mg Oral QHS  . metoprolol tartrate  25 mg Oral Q6H  . pneumococcal 23 valent vaccine  0.5 mL Intramuscular Tomorrow-1000  . traZODone  50 mg Oral QHS   Continuous Infusions: . diltiazem (CARDIZEM) infusion 15 mg/hr (09/04/19 0600)  . heparin 1,700 Units/hr (09/04/19 0600)   PRN Meds: acetaminophen **OR** acetaminophen, ALPRAZolam, bisacodyl, magnesium citrate, nitroGLYCERIN, ondansetron **OR** ondansetron (ZOFRAN) IV, polyethylene glycol, tiZANidine, traMADol   Vital Signs    Vitals:   09/03/19 2355 09/04/19 0104 09/04/19 0235 09/04/19 0413  BP: 98/64 120/66 106/76 (!) 100/57  Pulse: (!) 138 (!) 142 (!) 122 95  Resp: 18 (!) 22 20 16   Temp: 98.4 F (36.9 C)   98.1 F (36.7 C)  TempSrc: Oral   Oral  SpO2: 95% 96% 100% 95%  Weight:  (!) 149 kg  (!) 147.6 kg  Height:        Intake/Output Summary (Last 24 hours) at 09/04/2019 [id number] Last data filed at 09/04/2019 0600 Gross per 24 hour  Intake 470.33 ml  Output 1275 ml  Net -804.67 ml   Filed Weights   09/03/19 0949 09/04/19 0104 09/04/19 0413  Weight: 136.1 kg (!) 149 kg (!) 147.6 kg    Telemetry    Atrial flutter with variable AV block with intermittent tachycardic episodes into the 150s bpm - Personally  Reviewed  ECG    No new tracings - Personally Reviewed  Physical Exam   GEN: No acute distress.   Neck: No JVD. Cardiac: Irregular, no murmurs, rubs, or gallops.  Respiratory: Diminished breath sounds bilaterally.  GI: Soft, nontender, mildly distended.   MS: Trace to 1+ bilateral lower extremity edema with chronic venous stasis; No deformity. Neuro:  Alert and oriented x 3; Nonfocal.  Psych: Normal affect.  Labs    Chemistry Recent Labs  Lab 09/03/19 0955 09/04/19 0511  NA 138 136  K 4.4 4.4  CL 100 100  CO2 25 24  GLUCOSE 142* 170*  BUN 26* 20  CREATININE 1.37* 1.23  CALCIUM 9.1 8.9  GFRNONAA 53* 60*  GFRAA >60 >60  ANIONGAP 13 12     Hematology Recent Labs  Lab 09/03/19 0955 09/04/19 0511  WBC 6.6 9.4  RBC 4.38 4.23  HGB 13.2 12.7*  HCT 42.2 37.9*  MCV 96.3 89.6  MCH 30.1 30.0  MCHC 31.3 33.5  RDW 14.5 14.3  PLT 249 268    Cardiac EnzymesNo results for input(s): TROPONINI in the last 168 hours. No results for input(s): TROPIPOC in the last 168 hours.   BNP Recent Labs  Lab 09/03/19 0955  BNP 125.0*     DDimer No results for input(s): DDIMER in the last 168 hours.   Radiology  DG Chest 2 View  Result Date: 09/03/2019 IMPRESSION: Stable cardiac enlargement. Chronic bronchitic and probable emphysematous changes but no acute pulmonary findings. Electronically Signed   By: Marijo Sanes M.D.   On: 09/03/2019 10:27    Cardiac Studies   Cardiovascular Studies: Cath / PCI:  LHC (02/24/14): Nonobstructive CAD with 20-30% proximal LMCA, mild luminal irregularities of the LAD, mild to moderate disease of the LCx up to 30%, and 40% in-stent restenosis of mid RCA. LVEF is greater than 65% with LVEDP of 12 mmHg.  RHC (02/24/14): RA mean 8, RV 25/10, PA 27/15 (mean 21), PCWP 10 mmHg. SVO 267%. Fick CO 7.7 L/min, Fick CI 3.0 L/min/m; PVR 1.4 [place] Units.  LHC/PCI (05/27/13): LMCA 30% distal, LAD 40% mid, T1 40%, LCx with mild luminal irregularity,  RCA with 95% mid in-stent restenosis and 30% distal RCA disease. Proximal RCA stent is patent. Mid RCA was treated with 3.0 x 32 mm Promus drug-eluting stent.  Multiple left heart catheterizations and PCI's at [facility] and [facility].  EP Procedures and Devices:  Atrial fibrillation ablation 2 in 12/2008 and 04/2011 (Dr. Willis Modena)  Zio Patch 11/2014: Predominantly sinus rhyhtm with 6 runs of SVT up to 9 beats with max HR of 222 bpm. Occasional PAC's noted (3.7%).   Non-Invasive Evaluation(s):  TTE (09/2015): Procedure narrative: Transthoracic echocardiography. The study was technically difficult.- Left ventricle: The cavity size was normal. There was moderate concentric hypertrophy. Systolic function was normal. The estimated ejection fraction was in the range of 55% to 60%. Left ventricular diastolic function parameters were normal. - Left atrium: The atrium was mildly dilated.  TTE (07/2015): Non-diagnostic study.   TTE (12/26/13): Technically difficult study with probably normal LV systolic function. Mild left and right atrial dilation. Probably normal RV contraction. Question trivial pericardial effusion versus subendocardial fat.  TTE (03/27/09): Normal biventricular systolic function.  Myocardial PET stress (01/11/12): No evidence of significant ischemia or scar. LVEF is 60%.  Myocardial PET stress (11/2015): Low risk study. There is a small in size, mild in severity, nearly completely reversible defect involving the apical inferior and apical lateral segments. This is consistent with probable artifact (misregistration artifact and attenuation) but, cannot rule out subtle ischemia. EF 61%, RV dilated  Patient Profile     68 y.o. male with history of CAD status post PCI to the proximal RCA in 5956 complicated by ISR in 3875 and 2014 status post PCI/DES to the mid RCA in 05/2013, persistent A. fib status post prior ablation x2 in 2010 and 2012, HFpEF, paroxysmal SVT, DM2, COPD quitting tobacco  use in 2005, morbid obesity status post sleeve gastrectomy, OHS/OSA on CPAP, hyperparathyroidism, and GERD who is being seen today for the evaluation of atrial flutter with RVR.  Assessment & Plan    1. Atrial flutter with RVR: -He remains in atrial flutter with variable AV block with intermittent tachycardic episodes into the 150s bpm with associated dyspnea with exertion  -Stop lisinopril to allow for added BP room to maximize metoprolol and diltiazem for added rate control with transition to Toprol XL 100 mg daily starting tonight  -Stop diltiazem gtt with transition to Cardizem CD 300 mg daily  -Given IV digoxin 0.25 mg for 2 doses today per MD -Hopefully, with escalation of rate control therapy, we can avoid TEE/DCCV -Transition from heparin gtt to Eliquis 5 mg bid -He will need to ambulate to assess for adequate rate control and symptoms prior to discharge  -If his ventricular rates are well  controlled at rest and with ambulation and if he is asymptomatic with ambulation would plan for DCCV after he has been adequately anticoagulated without interruption for 3-4 weeks -If his ventricular rates are poorly controlled or if he is symptomatic with ambulation, he would need a TEE/DCCV prior to discharge   2. Acute on chronic HFpEF: -Mildly volume up, in the setting of the above -IV Lasix 20 mg bid for 2 doses with KCl  -Check echo now that his ventricular rate is improved   3. CAD involving the native coronary arteries without angina: -No angina -HS-Tn negative this admission -Plan for echo once ventricular rate is improved -Stop ASA, he will be transitioned to DOAC  4. Afib: -Status post ablation x 2  5. AKI: -Improved  For questions or updates, please contact [facility] Please consult [email or web address] for contact info under Cardiology/STEMI.    Signed, Eula Listen, PA-C [facility] Pager: [phone] 09/04/2019, 8:12 AM

## 2019-09-04 NOTE — Evaluation (Signed)
Occupational Therapy Evaluation Patient Details Name: Aaron Ball MRN: [id number] DOB: [birth year] Today's Date: 09/04/2019    History of Present Illness Patient is a 68 year old male admitted with atrial tachycardia. PMH includes: obesity, CAD, HLD, HTN, COPD, DM, chronic back pain.   Clinical Impression   Pt seen for OT evaluation this date. Prior to hospital admission, pt was Indep with self care ADLs and used SPC occasionally for fxl mobility.  Pt lives in [facility] with 3-4 STE with b/l railing. Pt states he has offer from local church to build ramp and is awaiting this process.  States a [employer] member brings him meals on Tuesdays and [employer] Whe[employer] delivers to him as well. Currently pt demonstrates impairments in standing balance and tolerance requiring CGA to Supv for fxl mobility, standing ADLs, and ADL transfers.  Pt would benefit from skilled OT to address noted impairments and functional limitations (see below for any additional details) in order to maximize safety and independence while minimizing falls risk and caregiver burden.  Upon hospital discharge, recommend pt discharge home with [place]. While pt appears to be close to his fxl baseline with self care, pt endorses several falls getting to restroom, would be beneficial for HHOT to assess environment for safety modification.    Follow Up Recommendations  Home health OT    Equipment Recommendations  Tub/shower seat    Recommendations for Other Services       Precautions / Restrictions Precautions Precautions: Fall Restrictions Weight Bearing Restrictions: No      Mobility Bed Mobility Overal bed mobility: Modified Independent             General bed mobility comments: use of rails  Transfers Overall transfer level: Needs assistance Equipment used: None Transfers: Sit to/from Stand Sit to Stand: Supervision              Balance Overall balance assessment: Modified Independent;Mild deficits  observed, not formally tested                                         ADL either performed or assessed with clinical judgement   ADL Overall ADL's : Needs assistance/impaired Eating/Feeding: Independent;Sitting   Grooming: Wash/dry hands;Wash/dry face;Oral care;Independent;Sitting   Upper Body Bathing: Supervision/ safety;Sitting   Lower Body Bathing: Supervison/ safety;Sit to/from stand   Upper Body Dressing : Independent;Sitting   Lower Body Dressing: Supervision/safety;Sit to/from stand Lower Body Dressing Details (indicate cue type and reason): pt does not wear socks at basline, only wears slip on shoes, unable to bend far enough to simulate donning/doffing socks. But beds appropriately to simulate threading underwear in sitting. Toilet Transfer: Supervision/safety;BSC;Ambulation(SPC for amb to/from [facility])   Toileting- Water quality scientist and Hygiene: Supervision/safety;Sit to/from stand(with SPC from [facility].)       Functional mobility during ADLs: Min guard;Cane       Vision Baseline Vision/History: Wears glasses Wears Glasses: At all times Patient Visual Report: No change from baseline       Perception     Praxis      Pertinent Vitals/Pain Pain Assessment: No/denies pain     Hand Dominance     Extremity/Trunk Assessment Upper Extremity Assessment Upper Extremity Assessment: Overall WFL for tasks assessed   Lower Extremity Assessment Lower Extremity Assessment: Generalized weakness   Cervical / Trunk Assessment Cervical / Trunk Assessment: Normal   Communication Communication Communication: [facility]  Cognition Arousal/Alertness: Awake/alert Behavior During Therapy: WFL for tasks assessed/performed Overall Cognitive Status: Within Functional Limits for tasks assessed                                     General Comments       Exercises Other Exercises Other Exercises: OT facilitates education re: role of OT. Pt verbalized  understanding. Other Exercises: OT facilitates fall prevention education, use of call bell, notified pt of bed alarm, pt verbalized understanding.   Shoulder Instructions      Home Living Family/patient expects to be discharged to:: Private residence Living Arrangements: Alone Available Help at Discharge: Friend(s)(woman from church brings him 2-3 meals Q Tuesday.) Type of Home: House Home Access: Stairs to enter [employer] of Steps: 4 Entrance Stairs-Rails: Right;Left;Can reach both Home Layout: One level               Home Equipment: Cane - single point;Bedside commode          Prior Functioning/Environment Level of Independence: Independent with assistive device(s)        Comments: Pt uses SPC for longer distances, says he has BSC but doesn't use.        OT Problem List: Decreased strength;Decreased activity tolerance;Impaired balance (sitting and/or standing)      OT Treatment/Interventions: Self-care/ADL training;Therapeutic exercise;Energy conservation;DME and/or AE instruction;Therapeutic activities;Balance training    OT Goals(Current goals can be found in the care plan section) Acute Rehab OT Goals Patient Stated Goal: To get out of the hospital soon OT Goal Formulation: With patient Time For Goal Achievement: 09/18/19 Potential to Achieve Goals: Good  OT Frequency: Min 1X/week   Barriers to D/C:            Co-evaluation              AM-PAC OT "6 Clicks" Daily Activity     Outcome Measure Help from another person eating meals?: None Help from another person taking care of personal grooming?: A Little Help from another person toileting, which includes using toliet, bedpan, or urinal?: A Little Help from another person bathing (including washing, rinsing, drying)?: A Little Help from another person to put on and taking off regular upper body clothing?: None Help from another person to put on and taking off regular lower body  clothing?: A Little 6 Click Score: 20   End of Session Equipment Utilized During Treatment: Gait belt(SPC) Nurse Communication: Mobility status(notified that pt attempted to have BM, but just passed gas and has a little discomfort.)  Activity Tolerance: Patient tolerated treatment well Patient left: in bed;with call bell/phone within reach;with bed alarm set  OT Visit Diagnosis: Unsteadiness on feet (R26.81)                Time: 2025-4270 OT Time Calculation (min): 38 min Charges:  OT General Charges $OT Visit: 1 Visit OT Evaluation $OT Eval Low Complexity: 1 Low OT Treatments $Self Care/Home Management : 8-22 mins $Therapeutic Activity: 8-22 mins  Rejeana Brock, MS, OTR/L ascom [phone] 09/04/19, 4:59 PM

## 2019-09-04 NOTE — Progress Notes (Signed)
PROGRESS NOTE    Aaron Ball  HXT:[id number] DOB: 1951/08/11 DOA: 09/03/2019 PCP: System, Pcp Not In    Brief Narrative:  : Aaron Ball is a 68 y.o. male with history of multiple comorbidities as outlined below, including coronary artery disease status post multiple stents, persistent A. fib status post ablation x2, type 2 diabetes, morbid obesity status post sleeve gastrectomy, OHS/OSA on CPAP, COPD.  He presented to the ED today with persistent palpitations and heart racing.  Found with atypical flutter on cardizem gtt. Covid negative.   Consultants:   Cardiology  Procedures: Echo  Antimicrobials:   None   Subjective: Patient feeling mild tachycardia today.  Denies any shortness of breath or chest pain.  Dyspnea on exertion mildly going to bathroom. telemetry heart rate 148  Objective: Vitals:   09/04/19 1128 09/04/19 1248 09/04/19 1320 09/04/19 1540  BP: 128/88  122/72 136/86  Pulse: (!) 107  68 71  Resp:    18  Temp:    98.7 F (37.1 C)  TempSrc:    Oral  SpO2: 97% 95%  99%  Weight:      Height:        Intake/Output Summary (Last 24 hours) at 09/04/2019 1647 Last data filed at 09/04/2019 1615 Gross per 24 hour  Intake 950.33 ml  Output 1725 ml  Net -774.67 ml   Filed Weights   09/03/19 0949 09/04/19 0104 09/04/19 0413  Weight: 136.1 kg (!) 149 kg (!) 147.6 kg    Examination:  General exam: Appears calm and comfortable  Respiratory system: Clear to auscultation. Respiratory effort normal. Cardiovascular system: S1 & S2 heard, regular tacky. No JVD, murmurs, rubs, gallops or clicks.  Gastrointestinal system: Abdomen is nondistended, soft and nontender. Normal bowel sounds heard. Central nervous system: Alert and oriented. No focal neurological deficits. Extremities: mild edema b/l Skin: Warm dry Psychiatry: Judgement and insight appear normal. Mood & affect appropriate.     Data Reviewed: I have personally reviewed following labs and  imaging studies  CBC: Recent Labs  Lab 09/03/19 0955 09/04/19 0511  WBC 6.6 9.4  HGB 13.2 12.7*  HCT 42.2 37.9*  MCV 96.3 89.6  PLT 249 165   Basic Metabolic Panel: Recent Labs  Lab 09/03/19 0955 09/04/19 0511  NA 138 136  K 4.4 4.4  CL 100 100  CO2 25 24  GLUCOSE 142* 170*  BUN 26* 20  CREATININE 1.37* 1.23  CALCIUM 9.1 8.9  MG  --  1.9   GFR: Estimated Creatinine Clearance: 78 mL/min (by C-G formula based on SCr of 1.23 mg/dL). Liver Function Tests: No results for input(s): AST, ALT, ALKPHOS, BILITOT, PROT, ALBUMIN in the last 168 hours. No results for input(s): LIPASE, AMYLASE in the last 168 hours. No results for input(s): AMMONIA in the last 168 hours. Coagulation Profile: Recent Labs  Lab 09/03/19 1925  INR 1.0   Cardiac Enzymes: No results for input(s): CKTOTAL, CKMB, CKMBINDEX, TROPONINI in the last 168 hours. BNP (last 3 results) No results for input(s): PROBNP in the last 8760 hours. HbA1C: No results for input(s): HGBA1C in the last 72 hours. CBG: Recent Labs  Lab 09/03/19 2032  GLUCAP 152*   Lipid Profile: No results for input(s): CHOL, HDL, LDLCALC, TRIG, CHOLHDL, LDLDIRECT in the last 72 hours. Thyroid Function Tests: Recent Labs    09/03/19 0955  TSH 3.291   Anemia Panel: No results for input(s): VITAMINB12, FOLATE, FERRITIN, TIBC, IRON, RETICCTPCT in the last 72 hours. Sepsis Labs:  No results for input(s): PROCALCITON, LATICACIDVEN in the last 168 hours.  Recent Results (from the past 240 hour(s))  SARS CORONAVIRUS 2 (TAT 6-24 HRS) Nasopharyngeal Nasopharyngeal Swab     Status: None   Collection Time: 09/03/19 11:05 AM   Specimen: Nasopharyngeal Swab  Result Value Ref Range Status   SARS Coronavirus 2 NEGATIVE NEGATIVE Final    Comment: (NOTE) SARS-CoV-2 target nucleic acids are NOT DETECTED. The SARS-CoV-2 RNA is generally detectable in upper and lower respiratory specimens during the acute phase of infection.  Negative results do not preclude SARS-CoV-2 infection, do not rule out co-infections with other pathogens, and should not be used as the sole basis for treatment or other patient management decisions. Negative results must be combined with clinical observations, patient history, and epidemiological information. The expected result is Negative. Fact Sheet for Patients: SugarRoll.be Fact Sheet for Healthcare Providers: https://www.woods-mathews.com/ This test is not yet approved or cleared by the [place] FDA and  has been authorized for detection and/or diagnosis of SARS-CoV-2 by FDA under an Emergency Use Authorization (EUA). This EUA will remain  in effect (meaning this test can be used) for the duration of the COVID-19 declaration under Section 56 4(b)(1) of the Act, 21 U.S.C. section 360bbb-3(b)(1), unless the authorization is terminated or revoked sooner. Performed at [facility], [place] [address], [place], [place] [address]          Radiology Studies: DG Chest 2 View  Result Date: 09/03/2019 CLINICAL DATA:  Heart palpitations EXAM: CHEST - 2 VIEW COMPARISON:  12/15/2017 FINDINGS: The heart is mildly enlarged but stable. Stable tortuosity and ectasia of the thoracic aorta. Chronic bronchitic type lung changes and probable emphysema. No definite acute overlying pulmonary process. No pleural effusion. The bony thorax is intact. IMPRESSION: Stable cardiac enlargement. Chronic bronchitic and probable emphysematous changes but no acute pulmonary findings. Electronically Signed   By: Marijo Sanes M.D.   On: 09/03/2019 10:27        Scheduled Meds: . allopurinol  300 mg Oral Daily  . apixaban  5 mg Oral BID  . atorvastatin  40 mg Oral Daily  . Chlorhexidine Gluconate Cloth  6 each Topical Daily  . digoxin  0.25 mg Intravenous BID  . diltiazem  300 mg Oral Daily  . furosemide  20 mg Intravenous BID  . gabapentin  300 mg Oral  QHS  . influenza vaccine adjuvanted  0.5 mL Intramuscular Tomorrow-1000  . loratadine  10 mg Oral QHS  . metoprolol succinate  100 mg Oral Daily  . pneumococcal 23 valent vaccine  0.5 mL Intramuscular Tomorrow-1000  . potassium chloride  10 mEq Oral BID  . traZODone  50 mg Oral QHS   Continuous Infusions:  Assessment & Plan:   Principal Problem:   Atrial tachycardia (HCC) Active Problems:   CAD (coronary artery disease)   Obesity   Hyperlipidemia   HTN (hypertension)   OSA (obstructive sleep apnea)   COPD (chronic obstructive pulmonary disease) (HCC)   Chronic back pain   Gout   Type 2 diabetes mellitus (HCC)   Allergic rhinitis    Atypical atrial flutter with RVR-heart rate still not controlled --Cardiology following -on Cardizem drip, will transition to po cardizem. -Metoprolol change to met. XL 150m -plan to give digoxin load -Heparin drip for anticoagulation, transition to Eliquis -Will need long-term anticoagulant  CAD (coronary artery disease) -stable --Continue aspirin and Plavix, sublingual nitro as needed  HTN (hypertension)  -Continue home medications  Acute  DHF/pulmonary edema- Continue iv lasix.  Echo pending, will f/u Monitor labs Once HR controlled, may be able to get him more euvolemic at that time  Type 2 diabetes mellitus (HCC) Last A1c in epic was 6.9 back in 2017.  Currently does not appear to be on any medications for this. --monitor fs, riss  Obesity Status post sleeve gastrectomy --Counseled on diet and lifestyle for weight loss  Hyperlipidemia -Continue home Lipitor  Chronic back pain -Continue home tramadol  Gout -not acutely flared -Continue home allopurinol  Allergic rhinitis -Continue home loratadine  OSA (obstructive sleep apnea) / OHS (obesity hypoventilation syndrome) --on CPAP, ordered here.  COPD (chronic obstructive pulmonary disease) (HCC)- Stable without exacerbation  Lower extremity  edema -Continue home Lasix as needed  hx/o paf-x2 ablation in the past    DVT prophylaxis: heparin Code Status: Full Family Communication: None at bedside  disposition Plan: DC home when medically stable.  Patient was discussed with Dr. Rockey Situ.       LOS: 0 days   Time spent: 45 minutes with more than 50% COC    Nolberto Hanlon, MD [facility] Pager [phone]  If 7PM-7AM, please contact night-coverage [email or web address] Password [facility] 09/04/2019, 4:47 PM

## 2019-09-04 NOTE — Progress Notes (Addendum)
HR persists at 140-145 per Central tele. BP 98/64; On call APP notified and received order to administer metoprolol, (parameter order is to hold Metop for SBP less than 110) Will CTM.   Pt reminded NPO after midnight for possible TEE and DCCV. Pt verbalized understanding.

## 2019-09-04 NOTE — Progress Notes (Signed)
On call APP in to see pt. Pt was up to [facility] for BM, and experienced SOB as well as elevated HR to 150's. O2 placed temporarily while OOB due to SOB IV Lasix ordered and foley placed due to DOE, and increased HR. Tolerated foley placement very well; penis noted to be retracted, thus condom cath cannot be used, and urinal also not feasible. Pt also noted to have very large pannus, foam placed to protect pannus from securement device. Pt continues with IV heparin and diltiazem drip. HR back down to 116-124 at rest.

## 2019-09-04 NOTE — Progress Notes (Signed)
Notify Dr. Kurtis Bushman about patient's complaints of diarrhea, had 3 loose stool today per patient, asking for anti-diarrheal medication, per MD to check stool for cdiff first. RN will continue to monitor.

## 2019-09-05 DIAGNOSIS — I25118 Atherosclerotic heart disease of native coronary artery with other forms of angina pectoris: Secondary | ICD-10-CM

## 2019-09-05 DIAGNOSIS — I4819 Other persistent atrial fibrillation: Secondary | ICD-10-CM

## 2019-09-05 DIAGNOSIS — J810 Acute pulmonary edema: Secondary | ICD-10-CM

## 2019-09-05 DIAGNOSIS — J811 Chronic pulmonary edema: Secondary | ICD-10-CM

## 2019-09-05 DIAGNOSIS — I4892 Unspecified atrial flutter: Secondary | ICD-10-CM

## 2019-09-05 LAB — BASIC METABOLIC PANEL
Anion gap: 9 (ref 5–15)
BUN: 22 mg/dL (ref 8–23)
CO2: 28 mmol/L (ref 22–32)
Calcium: 8.8 mg/dL — ABNORMAL LOW (ref 8.9–10.3)
Chloride: 101 mmol/L (ref 98–111)
Creatinine, Ser: 1.45 mg/dL — ABNORMAL HIGH (ref 0.61–1.24)
GFR calc Af Amer: 57 mL/min — ABNORMAL LOW (ref >60)
GFR calc non Af Amer: 49 mL/min — ABNORMAL LOW (ref >60)
Glucose, Bld: 165 mg/dL — ABNORMAL HIGH (ref 70–99)
Potassium: 4.4 mmol/L (ref 3.5–5.1)
Sodium: 138 mmol/L (ref 135–145)

## 2019-09-05 MED ORDER — DIGOXIN 0.25 MG/ML IJ SOLN
0.2500 mg | Freq: Once | INTRAMUSCULAR | Status: AC
  Administered 2019-09-05: 11:00:00 0.25 mg via INTRAVENOUS
  Filled 2019-09-05: qty 2

## 2019-09-05 NOTE — Progress Notes (Signed)
PT Cancellation Note  Patient Details Name: Aaron Ball MRN: [id number] DOB: 05/31/1951   Cancelled Treatment:    Reason Eval/Treat Not Completed: Fatigue/lethargy limiting ability to participate   Pt in recliner.  Stated he was up walking in room to bedside commode and to recliner with SPC.  Stated he was doing better today but fatigued from activity and did not want to do any more at this time.  Will return later for session as appropriate and monitor HR with ambulation per Cardiology note dated today and relay to nursing as appropriate.    Chesley Noon 09/05/2019, 10:47 AM

## 2019-09-05 NOTE — Progress Notes (Signed)
Foley discontinued. Pt tolerated well. 220 cc output of amber colored urine.

## 2019-09-05 NOTE — Progress Notes (Signed)
Physical Therapy Treatment Patient Details Name: Aaron Ball MRN: [id number] DOB: Dec 03, 1950 Today's Date: 09/05/2019    History of Present Illness Patient is a 68 year old male admitted with atrial tachycardia. PMH includes: obesity, CAD, HLD, HTN, COPD, DM, chronic back pain.    PT Comments    Pt presented with deficits in strength, transfers, mobility, gait, balance, and activity tolerance.  Pt's baseline HR at rest HR 83 bpm, after supine therex 104 bpm, after amb 10' 114 bpm, after amb 20' 128 bpm all with no adverse symptoms, nursing notified.  Pt steady with amb with a RW with very slow cadence and with cues for amb closer to the RW with upright posture.  Pt required increased effort with bed mobility tasks and transfers but was steady without LOB.  Pt will benefit from HHPT services upon discharge to safely address above deficits for decreased caregiver assistance and decreased risk of further functional decline.     Follow Up Recommendations  Home health PT;Supervision - Intermittent     Equipment Recommendations  None recommended by PT    Recommendations for Other Services       Precautions / Restrictions Precautions Precautions: Fall Precaution Comments: Watch HR Restrictions Weight Bearing Restrictions: No    Mobility  Bed Mobility Overal bed mobility: Modified Independent             General bed mobility comments: Extra time and effort and use of rails required  Transfers Overall transfer level: Needs assistance Equipment used: Rolling walker (2 wheeled) Transfers: Sit to/from Stand Sit to Stand: Supervision         General transfer comment: Extra effort to stand but good stability upon standing and good eccentric control during stand to sit  Ambulation/Gait Ambulation/Gait assistance: Supervision;Min guard Gait Distance (Feet): 20 Feet x  1, 10 Feet x 1 Assistive device: Rolling walker (2 wheeled) Gait Pattern/deviations: Step-through  pattern;Decreased step length - right;Decreased step length - left;Trunk flexed Gait velocity: decreased   General Gait Details: Slow cadence but steady with cues to amb closer to the RW; Baseline HR 83, after amb 10' 114 bpm, after amb 20' 128 bpm all with no adverse symptoms   Stairs             Wheelchair Mobility    Modified Rankin (Stroke Patients Only)       Balance Overall balance assessment: Needs assistance Sitting-balance support: Feet supported Sitting balance-Leahy Scale: Good     Standing balance support: Bilateral upper extremity supported Standing balance-Leahy Scale: Good                              Cognition Arousal/Alertness: Awake/alert Behavior During Therapy: WFL for tasks assessed/performed Overall Cognitive Status: Within Functional Limits for tasks assessed                                        Exercises Total Joint Exercises Ankle Circles/Pumps: Strengthening;Both;10 reps Quad Sets: Strengthening;Both;10 reps Gluteal Sets: Strengthening;Both;10 reps Hip ABduction/ADduction: AROM;Both;10 reps Long Arc Quad: Strengthening;Both;10 reps;5 reps Knee Flexion: Strengthening;Both;10 reps;5 reps    General Comments        Pertinent Vitals/Pain Pain Assessment: No/denies pain    Home Living Family/patient expects to be discharged to:: Private residence Living Arrangements: Alone  Prior Function            PT Goals (current goals can now be found in the care plan section) Progress towards PT goals: Progressing toward goals    Frequency    Min 2X/week      PT Plan Current plan remains appropriate    Co-evaluation              AM-PAC PT "6 Clicks" Mobility   Outcome Measure  Help needed turning from your back to your side while in a flat bed without using bedrails?: A Little Help needed moving from lying on your back to sitting on the side of a flat bed without using  bedrails?: A Little Help needed moving to and from a bed to a chair (including a wheelchair)?: A Little Help needed standing up from a chair using your arms (e.g., wheelchair or bedside chair)?: A Little Help needed to walk in hospital room?: A Little Help needed climbing 3-5 steps with a railing? : A Little 6 Click Score: 18    End of Session Equipment Utilized During Treatment: Gait belt Activity Tolerance: Patient tolerated treatment well Patient left: Other (comment);with call bell/phone within reach(Pt on [facility] for BM) Nurse Communication: Mobility status;Other (comment)(Pt left on BSC for BM with call bell within reach; HR response to amb) PT Visit Diagnosis: Muscle weakness (generalized) (M62.81);Difficulty in walking, not elsewhere classified (R26.2)     Time: 5093-2671 PT Time Calculation (min) (ACUTE ONLY): 24 min  Charges:  $Gait Training: 8-22 mins $Therapeutic Exercise: 8-22 mins                     D. Scott Shaguana Love PT, DPT 09/05/19, 2:15 PM

## 2019-09-05 NOTE — Progress Notes (Signed)
PROGRESS NOTE    Aaron Ball  DZH:[id number] DOB: 10/06/1950 DOA: 09/03/2019 PCP: System, Pcp Not In    Brief Narrative:  Aaron Ball a 68 y.o.malewithhistory of multiple comorbidities as outlined below, including coronary artery disease status post multiple stents, persistent A. fib status post ablation x2, type 2 diabetes, morbid obesity status post sleeve gastrectomy, OHS/OSA on CPAP, COPD.He presented to the ED today with persistent palpitations and heart racing. Found with atypical flutter on cardizem gtt. Covid negative.   Consultants:   Cardiology  Procedures: Echo  Antimicrobials:   None Subjective: Patient has no complaints this AM.'  Refused wearing his CPAP last night.  States shortness of breath is better.  Telemetry with heart rate from 100-110  Objective: Vitals:   09/04/19 2206 09/05/19 0350 09/05/19 0836 09/05/19 0945  BP: 110/70 103/79 117/67 125/74  Pulse: 74 76 (!) 138 67  Resp:    18  Temp:  98.1 F (36.7 C) 98 F (36.7 C) 98 F (36.7 C)  TempSrc:  Oral Oral Oral  SpO2:  91% 97% 99%  Weight:  (!) 145.7 kg    Height:        Intake/Output Summary (Last 24 hours) at 09/05/2019 1549 Last data filed at 09/05/2019 0352 Gross per 24 hour  Intake --  Output 1550 ml  Net -1550 ml   Filed Weights   09/04/19 0104 09/04/19 0413 09/05/19 0350  Weight: (!) 149 kg (!) 147.6 kg (!) 145.7 kg    Examination:  General exam: Appears calm and comfortable , nad, sitting in chair Respiratory system: cta, no w/r/r Cardiovascular system:Reg-irreg, mildly tacky S1 & S2 heard,no murmurs, rubs, gallops or clicks.  Gastrointestinal system: Abdomen is nondistended, soft and nontender.  Normal bowel sounds heard. Central nervous system: Alert and oriented. No focal neurological deficits. Extremities: edema b/l , no cynosis Skin: Warm and dry Psychiatry: Judgement and insight appear normal. Mood & affect appropriate.     Data Reviewed:  I have personally reviewed following labs and imaging studies  CBC: Recent Labs  Lab 09/03/19 0955 09/04/19 0511  WBC 6.6 9.4  HGB 13.2 12.7*  HCT 42.2 37.9*  MCV 96.3 89.6  PLT 249 419   Basic Metabolic Panel: Recent Labs  Lab 09/03/19 0955 09/04/19 0511 09/05/19 0444  NA 138 136 138  K 4.4 4.4 4.4  CL 100 100 101  CO2 25 24 28   GLUCOSE 142* 170* 165*  BUN 26* 20 22  CREATININE 1.37* 1.23 1.45*  CALCIUM 9.1 8.9 8.8*  MG  --  1.9  --    GFR: Estimated Creatinine Clearance: 65.7 mL/min (A) (by C-G formula based on SCr of 1.45 mg/dL (H)). Liver Function Tests: No results for input(s): AST, ALT, ALKPHOS, BILITOT, PROT, ALBUMIN in the last 168 hours. No results for input(s): LIPASE, AMYLASE in the last 168 hours. No results for input(s): AMMONIA in the last 168 hours. Coagulation Profile: Recent Labs  Lab 09/03/19 1925  INR 1.0   Cardiac Enzymes: No results for input(s): CKTOTAL, CKMB, CKMBINDEX, TROPONINI in the last 168 hours. BNP (last 3 results) No results for input(s): PROBNP in the last 8760 hours. HbA1C: No results for input(s): HGBA1C in the last 72 hours. CBG: Recent Labs  Lab 09/03/19 2032  GLUCAP 152*   Lipid Profile: No results for input(s): CHOL, HDL, LDLCALC, TRIG, CHOLHDL, LDLDIRECT in the last 72 hours. Thyroid Function Tests: Recent Labs    09/03/19 0955  TSH 3.291   Anemia Panel:  No results for input(s): VITAMINB12, FOLATE, FERRITIN, TIBC, IRON, RETICCTPCT in the last 72 hours. Sepsis Labs: No results for input(s): PROCALCITON, LATICACIDVEN in the last 168 hours.  Recent Results (from the past 240 hour(s))  SARS CORONAVIRUS 2 (TAT 6-24 HRS) Nasopharyngeal Nasopharyngeal Swab     Status: None   Collection Time: 09/03/19 11:05 AM   Specimen: Nasopharyngeal Swab  Result Value Ref Range Status   SARS Coronavirus 2 NEGATIVE NEGATIVE Final    Comment: (NOTE) SARS-CoV-2 target nucleic acids are NOT DETECTED. The SARS-CoV-2 RNA is  generally detectable in upper and lower respiratory specimens during the acute phase of infection. Negative results do not preclude SARS-CoV-2 infection, do not rule out co-infections with other pathogens, and should not be used as the sole basis for treatment or other patient management decisions. Negative results must be combined with clinical observations, patient history, and epidemiological information. The expected result is Negative. Fact Sheet for Patients: HairSlick.no Fact Sheet for Healthcare Providers: quierodirigir.com This test is not yet approved or cleared by the [place] FDA and  has been authorized for detection and/or diagnosis of SARS-CoV-2 by FDA under an Emergency Use Authorization (EUA). This EUA will remain  in effect (meaning this test can be used) for the duration of the COVID-19 declaration under Section 56 4(b)(1) of the Act, 21 U.S.C. section 360bbb-3(b)(1), unless the authorization is terminated or revoked sooner. Performed at [facility], [address], [place], [place] [address]          Radiology Studies: ECHOCARDIOGRAM COMPLETE  Result Date: 09/04/2019   ECHOCARDIOGRAM REPORT   Patient Name:   Aaron Ball Date of Exam: 09/04/2019 Medical Rec #:  [id number]          Height:       65.0 in Accession #:    [id number]         Weight:       325.5 lb Date of Birth:  [birth year]         BSA:          2.43 m Patient Age:    68 years           BP:           122/72 mmHg Patient Gender: M                  HR:           68 bpm. Exam Location:  [facility] Procedure: 2D Echo, Color Doppler and Cardiac Doppler Indications:     Dyspnea 786.09  History:         Patient has prior history of Echocardiogram examinations, most                  recent 10/09/2015. CAD, COPD, Arrythmias:Atrial Fibrillation;                  Risk Factors:Diabetes, Hypertension and Sleep Apnea.  Sonographer:     Cristela Blue RDCS  (AE) Referring Phys:  [id number] Raymon Mutton DUNN Diagnosing Phys: Julien Nordmann MD  Sonographer Comments: Technically difficult study due to poor echo windows, no subcostal window, no apical window and suboptimal parasternal window. IMPRESSIONS  1. Left ventricular ejection fraction, by visual estimation, is 55 to 60%. The left ventricle has normal function. Unable to exclude hypertrophy or wall motion abnormality.  2. Left ventricular diastolic parameters are indeterminate.  3. Global right ventricle was not well visualized.  4. Left atrial size was not  well visualized.  5. The mitral valve was not well visualized.  6. The tricuspid valve is not well visualized.  7. The aortic valve was not well visualized.  8. The pulmonic valve was not well visualized.  9. TR signal is inadequate for assessing pulmonary artery systolic pressure. FINDINGS  Left Ventricle: Left ventricular ejection fraction, by visual estimation, is 55 to 60%. The left ventricle has normal function. The left ventricle is not well visualized. There is no left ventricular hypertrophy. Left ventricular diastolic parameters are indeterminate. Normal left atrial pressure. Right Ventricle: The right ventricular size is not well visualized. Right vetricular wall thickness was not assessed. Global RV systolic function is was not well visualized. Left Atrium: Left atrial size was not well visualized. Right Atrium: Right atrial size was not well visualized Pericardium: There is no evidence of pericardial effusion. Mitral Valve: The mitral valve was not well visualized. No evidence of mitral valve regurgitation. No evidence of mitral valve stenosis by observation. Tricuspid Valve: The tricuspid valve is not well visualized. Tricuspid valve regurgitation is not demonstrated. Aortic Valve: The aortic valve was not well visualized. Aortic valve regurgitation is not visualized. The aortic valve is structurally normal, with no evidence of sclerosis or stenosis. Pulmonic  Valve: The pulmonic valve was not well visualized. Pulmonic valve regurgitation is not visualized. Pulmonic regurgitation is not visualized. Aorta: The aortic root, ascending aorta and aortic arch are all structurally normal, with no evidence of dilitation or obstruction. Venous: The inferior vena cava is normal in size with greater than 50% respiratory variability, suggesting right atrial pressure of 3 mmHg. IAS/Shunts: No atrial level shunt detected by color flow Doppler. There is no evidence of a patent foramen ovale. No ventricular septal defect is seen or detected. There is no evidence of an atrial septal defect.  LEFT VENTRICLE PLAX 2D LVIDd:         3.07 cm LVIDs:         2.24 cm LV PW:         1.39 cm LV IVS:        1.92 cm LVOT diam:     2.00 cm LV SV:         20 ml LV SV Index:   7.45 LVOT Area:     3.14 cm  LEFT ATRIUM         Index LA diam:    3.10 cm 1.27 cm/m                        PULMONIC VALVE AORTA                 PV Vmax:        0.62 m/s Ao Root diam: 3.20 cm PV Peak grad:   1.5 mmHg                       RVOT Peak grad: 2 mmHg   SHUNTS Systemic Diam: 2.00 cm  Julien Nordmannimothy Gollan MD Electronically signed by Julien Nordmannimothy Gollan MD Signature Date/Time: 09/04/2019/6:02:47 PM    Final         Scheduled Meds:  allopurinol  300 mg Oral Daily   apixaban  5 mg Oral BID   atorvastatin  40 mg Oral Daily   Chlorhexidine Gluconate Cloth  6 each Topical Daily   diltiazem  300 mg Oral Daily   gabapentin  300 mg Oral QHS   influenza vaccine adjuvanted  0.5 mL  Intramuscular Tomorrow-1000   loratadine  10 mg Oral QHS   metoprolol succinate  100 mg Oral Daily   pneumococcal 23 valent vaccine  0.5 mL Intramuscular Tomorrow-1000   traZODone  50 mg Oral QHS   Continuous Infusions:  Assessment & Plan:   Principal Problem:   Atrial flutter (HCC) Active Problems:   CAD (coronary artery disease)   Hyperlipidemia   HTN (hypertension)   Atrial tachycardia (HCC)   OSA (obstructive sleep  apnea)   COPD (chronic obstructive pulmonary disease) (HCC)   Chronic back pain   Gout   Type 2 diabetes mellitus (HCC)   Allergic rhinitis   Pulmonary edema   Morbid obesity (HCC)  Atypical atrial flutter with RVR-heart rate still not well controlled --Cardiology following -Cardizem 300 -Metoprolol. XL 100mg  to be continued Continue with digoxin Will need long-term anticoagulant  CAD (coronary artery disease)-stable --Continue aspirin and Plavix, sublingual nitro as needed  HTN (hypertension) -Continue home medications  Acute DHF/pulmonary edema- DC Lasix Echo 50 to 55% More euvolemic  AKI-mildly elevated 2/2 diuresis D/c lasix.  Monitor.  Type 2 diabetes mellitus (HCC) Last A1c in epic was 6.9 back in 2017. Currently does not appear to be on any medications for this. --monitor fs, riss  Obesity Status post sleeve gastrectomy --Counseled on diet and lifestyle for weight loss  Hyperlipidemia -Continue home Lipitor  Chronic back pain -Continue home tramadol  Gout-not acutely flared -Continue home allopurinol  Allergic rhinitis -Continue home loratadine  OSA (obstructive sleep apnea)/ OHS (obesity hypoventilation syndrome) --on CPAP, ordered here.  COPD (chronic obstructive pulmonary disease) (HCC)- Stable without exacerbation  Lower extremity edema -Continue home Lasix as needed  hx/o paf-x2 ablation in the past    DVT prophylaxis: Eliquis Code Status: Full Family Communication: None at bedside  disposition Plan: DC home when medically stable. possibly tomorrow. Will need home PT/OT     LOS: 1 day   Time spent: 45 minutes with more than 50% COC    2018, MD [facility] Pager [phone]  If 7PM-7AM, please contact night-coverage [email or web address] Password [facility] 09/05/2019, 3:49 PM

## 2019-09-05 NOTE — Progress Notes (Signed)
Progress Note  Patient Name: Aaron HammanWilliam Toney Mcdonnell Date of Encounter: 09/05/2019  [facility]  Subjective   Palpitations and dyspnea improving. Remains in atrial flutter with variable AV block with RVR with rates mostly in the low 100s bpm with occasional rates into the 150s bpm. No chest pain. Loose stools.   Inpatient Medications    Scheduled Meds: . allopurinol  300 mg Oral Daily  . apixaban  5 mg Oral BID  . atorvastatin  40 mg Oral Daily  . Chlorhexidine Gluconate Cloth  6 each Topical Daily  . diltiazem  300 mg Oral Daily  . gabapentin  300 mg Oral QHS  . influenza vaccine adjuvanted  0.5 mL Intramuscular Tomorrow-1000  . loratadine  10 mg Oral QHS  . metoprolol succinate  100 mg Oral Daily  . pneumococcal 23 valent vaccine  0.5 mL Intramuscular Tomorrow-1000  . traZODone  50 mg Oral QHS   Continuous Infusions:  PRN Meds: acetaminophen **OR** acetaminophen, ALPRAZolam, bisacodyl, magnesium citrate, metoprolol tartrate, nitroGLYCERIN, ondansetron **OR** ondansetron (ZOFRAN) IV, polyethylene glycol, simethicone, tiZANidine, traMADol   Vital Signs    Vitals:   09/04/19 1540 09/04/19 1958 09/04/19 2206 09/05/19 0350  BP: 136/86 117/61 110/70 103/79  Pulse: 71 72 74 76  Resp: 18     Temp: 98.7 F (37.1 C) 98.4 F (36.9 C)  98.1 F (36.7 C)  TempSrc: Oral Oral  Oral  SpO2: 99% 96%  91%  Weight:    (!) 145.7 kg  Height:        Intake/Output Summary (Last 24 hours) at 09/05/2019 0752 Last data filed at 09/05/2019 [id number] Gross per 24 hour  Intake 480 ml  Output 1550 ml  Net -1070 ml   Filed Weights   09/04/19 0104 09/04/19 0413 09/05/19 0350  Weight: (!) 149 kg (!) 147.6 kg (!) 145.7 kg    Telemetry    Atrial flutter with variable AV block with RVR with ventricular rates in the low 100s to 150s bpm - Personally Reviewed  ECG    No new tracings - Personally Reviewed  Physical Exam   GEN: No acute distress.   Neck: No JVD. Cardiac:  Tachycardic, irregular, no murmurs, rubs, or gallops.  Respiratory: Diminished breath sounds bilaterally.  GI: Soft, nontender, mildly distended.   MS: Trace to 1+ bilateral lower extremity edema with chronic venous stasis; No deformity. Neuro:  Alert and oriented x 3; Nonfocal.  Psych: Normal affect.  Labs    Chemistry Recent Labs  Lab 09/03/19 0955 09/04/19 0511 09/05/19 0444  NA 138 136 138  K 4.4 4.4 4.4  CL 100 100 101  CO2 25 24 28   GLUCOSE 142* 170* 165*  BUN 26* 20 22  CREATININE 1.37* 1.23 1.45*  CALCIUM 9.1 8.9 8.8*  GFRNONAA 53* 60* 49*  GFRAA >60 >60 57*  ANIONGAP 13 12 9      Hematology Recent Labs  Lab 09/03/19 0955 09/04/19 0511  WBC 6.6 9.4  RBC 4.38 4.23  HGB 13.2 12.7*  HCT 42.2 37.9*  MCV 96.3 89.6  MCH 30.1 30.0  MCHC 31.3 33.5  RDW 14.5 14.3  PLT 249 268    Cardiac EnzymesNo results for input(s): TROPONINI in the last 168 hours. No results for input(s): TROPIPOC in the last 168 hours.   BNP Recent Labs  Lab 09/03/19 0955  BNP 125.0*     DDimer No results for input(s): DDIMER in the last 168 hours.   Radiology    DG  Chest 2 View  Result Date: 09/03/2019 IMPRESSION: Stable cardiac enlargement. Chronic bronchitic and probable emphysematous changes but no acute pulmonary findings. Electronically Signed   By: Marijo Sanes M.D.   On: 09/03/2019 10:27    Cardiac Studies   Cardiovascular Studies: Cath / PCI:  LHC (02/24/14): Nonobstructive CAD with 20-30% proximal LMCA, mild luminal irregularities of the LAD, mild to moderate disease of the LCx up to 30%, and 40% in-stent restenosis of mid RCA. LVEF is greater than 65% with LVEDP of 12 mmHg.  RHC (02/24/14): RA mean 8, RV 25/10, PA 27/15 (mean 21), PCWP 10 mmHg. SVO 267%. Fick CO 7.7 L/min, Fick CI 3.0 L/min/m; PVR 1.4 Wood Units.  LHC/PCI (05/27/13): LMCA 30% distal, LAD 40% mid, T1 40%, LCx with mild luminal irregularity, RCA with 95% mid in-stent restenosis and 30% distal RCA  disease. Proximal RCA stent is patent. Mid RCA was treated with 3.0 x 32 mm Promus drug-eluting stent.  Multiple left heart catheterizations and PCI's at [facility] and [facility].  EP Procedures and Devices:  Atrial fibrillation ablation 2 in 12/2008 and 04/2011 (Dr. Willis Modena)  Zio Patch 11/2014: Predominantly sinus rhyhtm with 6 runs of SVT up to 9 beats with max HR of 222 bpm. Occasional PAC's noted (3.7%).   Non-Invasive Evaluation(s): 2D Echo 09/04/2019: 1. Left ventricular ejection fraction, by visual estimation, is 55 to 60%. The left ventricle has normal function. Unable to exclude hypertrophy or wall motion abnormality.  2. Left ventricular diastolic parameters are indeterminate.  3. Global right ventricle was not well visualized.  4. Left atrial size was not well visualized.  5. The mitral valve was not well visualized.  6. The tricuspid valve is not well visualized.  7. The aortic valve was not well visualized.  8. The pulmonic valve was not well visualized.  9. TR signal is inadequate for assessing pulmonary artery systolic pressure.   TTE (09/2015): Procedure narrative: Transthoracic echocardiography. The study was technically difficult.- Left ventricle: The cavity size was normal. There was moderate concentric hypertrophy. Systolic function was normal. The estimated ejection fraction was in the range of 55% to 60%. Left ventricular diastolic function parameters were normal. - Left atrium: The atrium was mildly dilated.  TTE (07/2015): Non-diagnostic study.   TTE (12/26/13): Technically difficult study with probably normal LV systolic function. Mild left and right atrial dilation. Probably normal RV contraction. Question trivial pericardial effusion versus subendocardial fat.  TTE (03/27/09): Normal biventricular systolic function.  Myocardial PET stress (01/11/12): No evidence of significant ischemia or scar. LVEF is 60%.  Myocardial PET stress (11/2015): Low risk study. There is  a small in size, mild in severity, nearly completely reversible defect involving the apical inferior and apical lateral segments. This is consistent with probable artifact (misregistration artifact and attenuation) but, cannot rule out subtle ischemia. EF 61%, RV dilated  Patient Profile     68 y.o. male with history of CAD status post PCI to the proximal RCA in 2595 complicated by ISR in 6387 and 2014 status post PCI/DES to the mid RCA in 05/2013, persistent A. fib status post prior ablation x2 in 2010 and 2012, HFpEF, paroxysmal SVT, DM2, COPD quitting tobacco use in 2005, morbid obesity status post sleeve gastrectomy, OHS/OSA on CPAP, hyperparathyroidism, and GERD who is being seen today for the evaluation of atrial flutter with RVR.  Assessment & Plan    1. Atrial flutter with RVR: -He remains in atrial flutter with variable AV block with RVR with ventricular rates  in the low 100s to 150s bpm with associated dyspnea with exertion  -Lisinopril has been held to allow for added BP room to maximize metoprolol and diltiazem for added rate control with Toprol XL 100 mg daily and Cardizem CD 300 mg daily -Given IV digoxin 0.25 mg for 2 doses on 12/23 -Give another 0.25 mg of digoxin this morning IV to finish his load -Continue Toprol XL and Cardizem CD -Goal heart rates in the low 100s with minimal ambulation after discussion with MD -Hopefully, with escalation of rate control therapy, we can avoid TEE/DCCV -Continue Eliquis 5 mg bid -He will need to ambulate to assess for adequate rate control and symptoms prior to discharge  -If his ventricular rates are well controlled at rest and with ambulation and if he is asymptomatic with ambulation would plan for DCCV after he has been adequately anticoagulated without interruption for 3-4 weeks -If his ventricular rates are poorly controlled or if he is symptomatic with ambulation, he would need a TEE/DCCV prior to discharge   2. Acute on chronic  HFpEF: -Mild bump in renal function with IV Lasix, hold -Echo as above  3. CAD involving the native coronary arteries without angina: -No angina -HS-Tn negative this admission -Echo with preserved LVSF as above -ASA stopped with the initiation of Eliquis  4. Afib: -Status post ablation x 2  5. AKI: -Slight bump in SCr with IV Lasix, which will be held   For questions or updates, please contact [facility] Please consult [email or web address] for contact info under Cardiology/STEMI.    Signed, Eula Listen, PA-C [facility] Pager: [phone] 09/05/2019, 7:52 AM

## 2019-09-05 NOTE — Plan of Care (Signed)
  Problem: Clinical Measurements: Goal: Diagnostic test results will improve Outcome: Progressing Goal: Cardiovascular complication will be avoided Outcome: Progressing   Problem: Education: Goal: Understanding of medication regimen will improve Outcome: Progressing   Problem: Cardiac: Goal: Ability to achieve and maintain adequate cardiopulmonary perfusion will improve Outcome: Progressing

## 2019-09-06 DIAGNOSIS — E785 Hyperlipidemia, unspecified: Secondary | ICD-10-CM

## 2019-09-06 LAB — BASIC METABOLIC PANEL
Anion gap: 11 (ref 5–15)
BUN: 26 mg/dL — ABNORMAL HIGH (ref 8–23)
CO2: 25 mmol/L (ref 22–32)
Calcium: 8.6 mg/dL — ABNORMAL LOW (ref 8.9–10.3)
Chloride: 101 mmol/L (ref 98–111)
Creatinine, Ser: 1.38 mg/dL — ABNORMAL HIGH (ref 0.61–1.24)
GFR calc Af Amer: >60 mL/min (ref >60)
GFR calc non Af Amer: 52 mL/min — ABNORMAL LOW (ref >60)
Glucose, Bld: 149 mg/dL — ABNORMAL HIGH (ref 70–99)
Potassium: 4.5 mmol/L (ref 3.5–5.1)
Sodium: 137 mmol/L (ref 135–145)

## 2019-09-06 LAB — DIGOXIN LEVEL: Digoxin Level: 1.2 ng/mL (ref 0.8–2.0)

## 2019-09-06 NOTE — Progress Notes (Signed)
Progress Note  Patient Name: Aaron Ball Date of Encounter: 09/06/2019  Primary Cardiologist: [facility]  Subjective   The patient was trying to move around in bed today and his heart rate increased to 151 bpm.  He continues to be tachycardic most of the morning.  Inpatient Medications    Scheduled Meds: . allopurinol  300 mg Oral Daily  . apixaban  5 mg Oral BID  . atorvastatin  40 mg Oral Daily  . Chlorhexidine Gluconate Cloth  6 each Topical Daily  . diltiazem  300 mg Oral Daily  . gabapentin  300 mg Oral QHS  . influenza vaccine adjuvanted  0.5 mL Intramuscular Tomorrow-1000  . loratadine  10 mg Oral QHS  . metoprolol succinate  100 mg Oral Daily  . pneumococcal 23 valent vaccine  0.5 mL Intramuscular Tomorrow-1000  . traZODone  50 mg Oral QHS   Continuous Infusions:  PRN Meds: acetaminophen **OR** acetaminophen, ALPRAZolam, bisacodyl, magnesium citrate, metoprolol tartrate, nitroGLYCERIN, ondansetron **OR** ondansetron (ZOFRAN) IV, polyethylene glycol, simethicone, tiZANidine, traMADol   Vital Signs    Vitals:   09/06/19 0402 09/06/19 0412 09/06/19 0734 09/06/19 0736  BP:  108/83 (!) 138/96   Pulse:  80 (!) 134 75  Resp:  20 19   Temp:  98.3 F (36.8 C) 98 F (36.7 C)   TempSrc:  Oral Oral   SpO2:  98% 94% 95%  Weight: (!) 146.7 kg     Height:        Intake/Output Summary (Last 24 hours) at 09/06/2019 1126 Last data filed at 09/06/2019 7672 Gross per 24 hour  Intake --  Output 620 ml  Net -620 ml   Filed Weights   09/04/19 0413 09/05/19 0350 09/06/19 0402  Weight: (!) 147.6 kg (!) 145.7 kg (!) 146.7 kg    Telemetry    Atrial flutter with variable AV block with RVR with ventricular rates in the low 100s to 150s bpm - Personally Reviewed  ECG    No new tracings - Personally Reviewed  Physical Exam   GEN: No acute distress.   Neck: No JVD. Cardiac: Tachycardic, irregular, no murmurs, rubs, or gallops.  Respiratory: Diminished breath  sounds bilaterally.  GI: Soft, nontender, mildly distended.   MS: Trace to 1+ bilateral lower extremity edema with chronic venous stasis; No deformity. Neuro:  Alert and oriented x 3; Nonfocal.  Psych: Normal affect.  Labs    Chemistry Recent Labs  Lab 09/04/19 0511 09/05/19 0444 09/06/19 0256  NA 136 138 137  K 4.4 4.4 4.5  CL 100 101 101  CO2 24 28 25   GLUCOSE 170* 165* 149*  BUN 20 22 26*  CREATININE 1.23 1.45* 1.38*  CALCIUM 8.9 8.8* 8.6*  GFRNONAA 60* 49* 52*  GFRAA >60 57* >60  ANIONGAP 12 9 11      Hematology Recent Labs  Lab 09/03/19 0955 09/04/19 0511  WBC 6.6 9.4  RBC 4.38 4.23  HGB 13.2 12.7*  HCT 42.2 37.9*  MCV 96.3 89.6  MCH 30.1 30.0  MCHC 31.3 33.5  RDW 14.5 14.3  PLT 249 268    Cardiac EnzymesNo results for input(s): TROPONINI in the last 168 hours. No results for input(s): TROPIPOC in the last 168 hours.   BNP Recent Labs  Lab 09/03/19 0955  BNP 125.0*     DDimer No results for input(s): DDIMER in the last 168 hours.   Radiology    DG Chest 2 View  Result Date: 09/03/2019 IMPRESSION: Stable  cardiac enlargement. Chronic bronchitic and probable emphysematous changes but no acute pulmonary findings. Electronically Signed   By: Rudie MeyerP.  Gallerani M.D.   On: 09/03/2019 10:27    Cardiac Studies   Cardiovascular Studies: Cath / PCI:  LHC (02/24/14): Nonobstructive CAD with 20-30% proximal LMCA, mild luminal irregularities of the LAD, mild to moderate disease of the LCx up to 30%, and 40% in-stent restenosis of mid RCA. LVEF is greater than 65% with LVEDP of 12 mmHg.  RHC (02/24/14): RA mean 8, RV 25/10, PA 27/15 (mean 21), PCWP 10 mmHg. SVO 267%. Fick CO 7.7 L/min, Fick CI 3.0 L/min/m; PVR 1.4 Wood Units.  LHC/PCI (05/27/13): LMCA 30% distal, LAD 40% mid, T1 40%, LCx with mild luminal irregularity, RCA with 95% mid in-stent restenosis and 30% distal RCA disease. Proximal RCA stent is patent. Mid RCA was treated with 3.0 x 32 mm Promus  drug-eluting stent.  [facility] heart catheterizations and [facility] and [facility].  EP Procedures and Devices:  Atrial fibrillation ablation 2 in 12/2008 and 04/2011 (Dr. Herbert DeanerGehi)  Zio Patch 11/2014: Predominantly sinus rhyhtm with 6 runs of SVT up to 9 beats with max HR of 222 bpm. Occasional PAC's noted (3.7%).   Non-Invasive Evaluation(s): 2D Echo 09/04/2019: 1. Left ventricular ejection fraction, by visual estimation, is 55 to 60%. The left ventricle has normal function. Unable to exclude hypertrophy or wall motion abnormality.  2. Left ventricular diastolic parameters are indeterminate.  3. Global right ventricle was not well visualized.  4. Left atrial size was not well visualized.  5. The mitral valve was not well visualized.  6. The tricuspid valve is not well visualized.  7. The aortic valve was not well visualized.  8. The pulmonic valve was not well visualized.  9. TR signal is inadequate for assessing pulmonary artery systolic pressure.   TTE (09/2015): Procedure narrative: Transthoracic echocardiography. The study was technically difficult.- Left ventricle: The cavity size was normal. There was moderate concentric hypertrophy. Systolic function was normal. The estimated ejection fraction was in the range of 55% to 60%. Left ventricular diastolic function parameters were normal. - Left atrium: The atrium was mildly dilated.  TTE (07/2015): Non-diagnostic study.   TTE (12/26/13): Technically difficult study with probably normal LV systolic function. Mild left and right atrial dilation. Probably normal RV contraction. Question trivial pericardial effusion versus subendocardial fat.  TTE (03/27/09): Normal biventricular systolic function.  Myocardial PET stress (01/11/12): No evidence of significant ischemia or scar. LVEF is 60%.  Myocardial PET stress (11/2015): Low risk study. There is a small in size, mild in severity, nearly completely reversible defect involving  the apical inferior and apical lateral segments. This is consistent with probable artifact (misregistration artifact and attenuation) but, cannot rule out subtle ischemia. EF 61%, RV dilated  Patient Profile     68 y.o. male with history of CAD status post PCI to the proximal RCA in 1997 complicated by ISR in 2003 and 2014 status post PCI/DES to the mid RCA in 05/2013, persistent A. fib status post prior ablation x2 in 2010 and 2012, HFpEF, paroxysmal SVT, DM2, COPD quitting tobacco use in 2005, morbid obesity status post sleeve gastrectomy, OHS/OSA on CPAP, hyperparathyroidism, and GERD who is being seen today for the evaluation of atrial flutter with RVR.  Assessment & Plan    1. Atrial flutter with RVR: -He remains in atrial flutter with variable AV block with RVR with ventricular rates in the low 100s to 150s bpm with associated  dyspnea with exertion  -Lisinopril has been held to allow for added BP room to maximize metoprolol and diltiazem for added rate control with Toprol XL 100 mg daily and Cardizem CD 300 mg daily -Continue Eliquis 5 mg bid -Atrial flutter is known to be difficult to control with medications and I do believe he will require TEE guided cardioversion on Monday. -Continue same medications for now.  If heart rate improves over the weekend, he can be discharged home with plans for outpatient cardioversion.  If however, ventricular rate continues to be high with minimal exertion, TEE cardioversion will be done on Monday.  2. Acute on chronic HFpEF: -Mild bump in renal function with IV Lasix, hold -Echo as above  3. CAD involving the native coronary arteries without angina: -No angina -HS-Tn negative this admission -Echo with preserved LVSF as above -ASA stopped with the initiation of Eliquis   I discussed the case with Dr. Kurtis Bushman.  For questions or updates, please contact [place] Please consult [email or web address] for contact info under Cardiology/STEMI.     Signed, Kathlyn Sacramento, MD [facility] 09/06/2019, 11:26 AM

## 2019-09-06 NOTE — Progress Notes (Signed)
PROGRESS NOTE    Aaron Ball  QAS:[id number] DOB: [birth year] DOA: 09/03/2019 PCP: System, Pcp Not In    Brief Narrative:  Aaron Ball a 68 y.o.malewithhistory of multiple comorbidities as outlined below, including coronary artery disease status post multiple stents, persistent A. fib status post ablation x2, type 2 diabetes, morbid obesity status post sleeve gastrectomy, OHS/OSA on CPAP, COPD.He presented to the ED today with persistent palpitations and heart racing.Found with atypical flutter on cardizem gtt. Covid negative.   Consultants:  Cardiology  Procedures:Echo  Antimicrobials:  None  Subjective: Patient laying in bed sleeping telemetry revealing heart rate between 115-120 . Pt denies palpitations, sob, or cp. No further soft stool.   Objective: Vitals:   09/06/19 0402 09/06/19 0412 09/06/19 0734 09/06/19 0736  BP:  108/83 (!) 138/96   Pulse:  80 (!) 134 75  Resp:  20 19   Temp:  98.3 F (36.8 C) 98 F (36.7 C)   TempSrc:  Oral Oral   SpO2:  98% 94% 95%  Weight: (!) 146.7 kg     Height:        Intake/Output Summary (Last 24 hours) at 09/06/2019 1129 Last data filed at 09/06/2019 7989 Gross per 24 hour  Intake --  Output 620 ml  Net -620 ml   Filed Weights   09/04/19 0413 09/05/19 0350 09/06/19 0402  Weight: (!) 147.6 kg (!) 145.7 kg (!) 146.7 kg    Examination: General exam: Appears calm and comfortable , nad,  Respiratory system: cta, no w/r/r Cardiovascular system:Irregular,  tacky S1 & S2 heard,no murmurs, rubs, gallops . Gastrointestinal system: Abdomen is nondistended, soft and nontender.  Normal bowel sounds heard. Central nervous system: Alert and oriented. No focal neurological deficits. Extremities: edema b/l , no cynosis Skin: Warm and dry Psychiatry: Judgement and insight appear normal. Mood & affect appropriate.    Data Reviewed: I have personally reviewed following labs and imaging  studies  CBC: Recent Labs  Lab 09/03/19 0955 09/04/19 0511  WBC 6.6 9.4  HGB 13.2 12.7*  HCT 42.2 37.9*  MCV 96.3 89.6  PLT 249 268   Basic Metabolic Panel: Recent Labs  Lab 09/03/19 0955 09/04/19 0511 09/05/19 0444 09/06/19 0256  NA 138 136 138 137  K 4.4 4.4 4.4 4.5  CL 100 100 101 101  CO2 25 24 28 25   GLUCOSE 142* 170* 165* 149*  BUN 26* 20 22 26*  CREATININE 1.37* 1.23 1.45* 1.38*  CALCIUM 9.1 8.9 8.8* 8.6*  MG  --  1.9  --   --    GFR: Estimated Creatinine Clearance: 69.3 mL/min (A) (by C-G formula based on SCr of 1.38 mg/dL (H)). Liver Function Tests: No results for input(s): AST, ALT, ALKPHOS, BILITOT, PROT, ALBUMIN in the last 168 hours. No results for input(s): LIPASE, AMYLASE in the last 168 hours. No results for input(s): AMMONIA in the last 168 hours. Coagulation Profile: Recent Labs  Lab 09/03/19 1925  INR 1.0   Cardiac Enzymes: No results for input(s): CKTOTAL, CKMB, CKMBINDEX, TROPONINI in the last 168 hours. BNP (last 3 results) No results for input(s): PROBNP in the last 8760 hours. HbA1C: No results for input(s): HGBA1C in the last 72 hours. CBG: Recent Labs  Lab 09/03/19 2032  GLUCAP 152*   Lipid Profile: No results for input(s): CHOL, HDL, LDLCALC, TRIG, CHOLHDL, LDLDIRECT in the last 72 hours. Thyroid Function Tests: No results for input(s): TSH, T4TOTAL, FREET4, T3FREE, THYROIDAB in the last 72 hours. Anemia Panel:  No results for input(s): VITAMINB12, FOLATE, FERRITIN, TIBC, IRON, RETICCTPCT in the last 72 hours. Sepsis Labs: No results for input(s): PROCALCITON, LATICACIDVEN in the last 168 hours.  Recent Results (from the past 240 hour(s))  SARS CORONAVIRUS 2 (TAT 6-24 HRS) Nasopharyngeal Nasopharyngeal Swab     Status: None   Collection Time: 09/03/19 11:05 AM   Specimen: Nasopharyngeal Swab  Result Value Ref Range Status   SARS Coronavirus 2 NEGATIVE NEGATIVE Final    Comment: (NOTE) SARS-CoV-2 target nucleic acids are  NOT DETECTED. The SARS-CoV-2 RNA is generally detectable in upper and lower respiratory specimens during the acute phase of infection. Negative results do not preclude SARS-CoV-2 infection, do not rule out co-infections with other pathogens, and should not be used as the sole basis for treatment or other patient management decisions. Negative results must be combined with clinical observations, patient history, and epidemiological information. The expected result is Negative. Fact Sheet for Patients: HairSlick.no Fact Sheet for Healthcare Providers: quierodirigir.com This test is not yet approved or cleared by the [place] FDA and  has been authorized for detection and/or diagnosis of SARS-CoV-2 by FDA under an Emergency Use Authorization (EUA). This EUA will remain  in effect (meaning this test can be used) for the duration of the COVID-19 declaration under Section 56 4(b)(1) of the Act, 21 U.S.C. section 360bbb-3(b)(1), unless the authorization is terminated or revoked sooner. Performed at [facility], [address] [place], [place] [address]          Radiology Studies: ECHOCARDIOGRAM COMPLETE  Result Date: 09/04/2019   ECHOCARDIOGRAM REPORT   Patient Name:   Aaron Ball Stalker Date of Exam: 09/04/2019 Medical Rec #:  [id number]          Height:       65.0 in Accession #:    [id number]         Weight:       325.5 lb Date of Birth:  Dec 19, 1950         BSA:          2.43 m Patient Age:    68 years           BP:           122/72 mmHg Patient Gender: M                  HR:           68 bpm. Exam Location:  [facility] Procedure: 2D Echo, Color Doppler and Cardiac Doppler Indications:     Dyspnea 786.09  History:         Patient has prior history of Echocardiogram examinations, most                  recent 10/09/2015. CAD, COPD, Arrythmias:Atrial Fibrillation;                  Risk Factors:Diabetes, Hypertension and Sleep Apnea.   Sonographer:     Cristela Blue RDCS (AE) Referring Phys:  [id number] Raymon Mutton DUNN Diagnosing Phys: Julien Nordmann MD  Sonographer Comments: Technically difficult study due to poor echo windows, no subcostal window, no apical window and suboptimal parasternal window. IMPRESSIONS  1. Left ventricular ejection fraction, by visual estimation, is 55 to 60%. The left ventricle has normal function. Unable to exclude hypertrophy or wall motion abnormality.  2. Left ventricular diastolic parameters are indeterminate.  3. Global right ventricle was not well visualized.  4. Left atrial size was not  well visualized.  5. The mitral valve was not well visualized.  6. The tricuspid valve is not well visualized.  7. The aortic valve was not well visualized.  8. The pulmonic valve was not well visualized.  9. TR signal is inadequate for assessing pulmonary artery systolic pressure. FINDINGS  Left Ventricle: Left ventricular ejection fraction, by visual estimation, is 55 to 60%. The left ventricle has normal function. The left ventricle is not well visualized. There is no left ventricular hypertrophy. Left ventricular diastolic parameters are indeterminate. Normal left atrial pressure. Right Ventricle: The right ventricular size is not well visualized. Right vetricular wall thickness was not assessed. Global RV systolic function is was not well visualized. Left Atrium: Left atrial size was not well visualized. Right Atrium: Right atrial size was not well visualized Pericardium: There is no evidence of pericardial effusion. Mitral Valve: The mitral valve was not well visualized. No evidence of mitral valve regurgitation. No evidence of mitral valve stenosis by observation. Tricuspid Valve: The tricuspid valve is not well visualized. Tricuspid valve regurgitation is not demonstrated. Aortic Valve: The aortic valve was not well visualized. Aortic valve regurgitation is not visualized. The aortic valve is structurally normal, with no evidence  of sclerosis or stenosis. Pulmonic Valve: The pulmonic valve was not well visualized. Pulmonic valve regurgitation is not visualized. Pulmonic regurgitation is not visualized. Aorta: The aortic root, ascending aorta and aortic arch are all structurally normal, with no evidence of dilitation or obstruction. Venous: The inferior vena cava is normal in size with greater than 50% respiratory variability, suggesting right atrial pressure of 3 mmHg. IAS/Shunts: No atrial level shunt detected by color flow Doppler. There is no evidence of a patent foramen ovale. No ventricular septal defect is seen or detected. There is no evidence of an atrial septal defect.  LEFT VENTRICLE PLAX 2D LVIDd:         3.07 cm LVIDs:         2.24 cm LV PW:         1.39 cm LV IVS:        1.92 cm LVOT diam:     2.00 cm LV SV:         20 ml LV SV Index:   7.45 LVOT Area:     3.14 cm  LEFT ATRIUM         Index LA diam:    3.10 cm 1.27 cm/m                        PULMONIC VALVE AORTA                 PV Vmax:        0.62 m/s Ao Root diam: 3.20 cm PV Peak grad:   1.5 mmHg                       RVOT Peak grad: 2 mmHg   SHUNTS Systemic Diam: 2.00 cm  Julien Nordmannimothy Gollan MD Electronically signed by Julien Nordmannimothy Gollan MD Signature Date/Time: 09/04/2019/6:02:47 PM    Final         Scheduled Meds: . allopurinol  300 mg Oral Daily  . apixaban  5 mg Oral BID  . atorvastatin  40 mg Oral Daily  . Chlorhexidine Gluconate Cloth  6 each Topical Daily  . diltiazem  300 mg Oral Daily  . gabapentin  300 mg Oral QHS  . influenza vaccine adjuvanted  0.5 mL  Intramuscular Tomorrow-1000  . loratadine  10 mg Oral QHS  . metoprolol succinate  100 mg Oral Daily  . pneumococcal 23 valent vaccine  0.5 mL Intramuscular Tomorrow-1000  . traZODone  50 mg Oral QHS   Continuous Infusions:  Assessment & Plan:   Principal Problem:   Atrial flutter (HCC) Active Problems:   CAD (coronary artery disease)   Hyperlipidemia   HTN (hypertension)   AKI (acute kidney  injury) (HCC)   Atrial tachycardia (HCC)   OSA (obstructive sleep apnea)   COPD (chronic obstructive pulmonary disease) (HCC)   Chronic back pain   Gout   Type 2 diabetes mellitus (HCC)   Allergic rhinitis   Pulmonary edema   Morbid obesity (HCC)   Atypical atrial flutter with RVR-heart rate still not well controlled --Cardiology following- Spoke to Dr. Fletcher Anon, Pt HR still not well controlled, and plan for TEE with cardioversion on Monday. -Continue with Cardizem 300 -Metoprolol. XL 100mg  to be continued Continue with digoxin Will need long-term anticoagulant  CAD (coronary artery disease)-stable --Continue aspirin and Plavix, sublingual nitro as needed  HTN (hypertension) -Continue home medications  Acute DHF/pulmonary edema- DC'd Lasix Echo 50 to 55% More euvolemic, but will monitor  AKI-mildly elevated 2/2 diuresis D/c'd  lasix.  Improving.  Type 2 diabetes mellitus (HCC) Last A1c in epic was 6.9 back in 2017. Currently does not appear to be on any medications for this. --monitor fs, riss  Obesity Status post sleeve gastrectomy --Counseled on diet and lifestyle for weight loss  Hyperlipidemia -Continue home Lipitor  Chronic back pain -Continue home tramadol  Gout-not acutely flared -Continue home allopurinol  Allergic rhinitis -Continue home loratadine  OSA (obstructive sleep apnea)/ OHS (obesity hypoventilation syndrome) --on CPAP, ordered here.  COPD (chronic obstructive pulmonary disease) (HCC)- Stable without exacerbation  Lower extremity edema -Continue home Lasix as needed  hx/o paf-x2 ablation in the past    DVT prophylaxis:Eliquis Code Status:Full Family Communication:None at bedside  disposition Plan:We will be here 2 days as he will require TEE cardioversion on monday 6 currently not well controlled.  Will need home PT/OT     LOS: 2 days   Time spent: 45 minutes with more than 50% on [place], MD [facility] Pager [phone]  If 7PM-7AM, please contact night-coverage [email or web address] Password [facility] 09/06/2019, 11:29 AM

## 2019-09-06 NOTE — Progress Notes (Signed)
Patient still have not urinated. Bladder scan read 68 ml of urine. Patient states that he don't feel the need to urinate at the moment, will continue to monitor.

## 2019-09-07 DIAGNOSIS — F419 Anxiety disorder, unspecified: Secondary | ICD-10-CM

## 2019-09-07 LAB — CBC
HCT: 36.8 % — ABNORMAL LOW (ref 39.0–52.0)
Hemoglobin: 11.9 g/dL — ABNORMAL LOW (ref 13.0–17.0)
MCH: 30 pg (ref 26.0–34.0)
MCHC: 32.3 g/dL (ref 30.0–36.0)
MCV: 92.7 fL (ref 80.0–100.0)
Platelets: 235 10*3/uL (ref 150–400)
RBC: 3.97 MIL/uL — ABNORMAL LOW (ref 4.22–5.81)
RDW: 14.6 % (ref 11.5–15.5)
WBC: 10.2 10*3/uL (ref 4.0–10.5)
nRBC: 0 % (ref 0.0–0.2)

## 2019-09-07 MED ORDER — ALPRAZOLAM 0.25 MG PO TABS
0.2500 mg | ORAL_TABLET | Freq: Every day | ORAL | Status: DC
  Administered 2019-09-07 – 2019-09-11 (×5): 0.25 mg via ORAL
  Filled 2019-09-07 (×5): qty 1

## 2019-09-07 MED ORDER — DIGOXIN 125 MCG PO TABS
0.1250 mg | ORAL_TABLET | Freq: Every day | ORAL | Status: DC
  Administered 2019-09-08 – 2019-09-09 (×2): 0.125 mg via ORAL
  Filled 2019-09-07 (×2): qty 1

## 2019-09-07 MED ORDER — METOPROLOL SUCCINATE ER 100 MG PO TB24
100.0000 mg | ORAL_TABLET | Freq: Two times a day (BID) | ORAL | Status: DC
  Administered 2019-09-08 (×2): 100 mg via ORAL
  Filled 2019-09-07 (×3): qty 1

## 2019-09-07 NOTE — Progress Notes (Signed)
Patient complaining of anxiety, cant have xanax until 3pm. Dr Kurtis Bushman notified and asked if he can have anything else, per MD ok to give dose early if patient wants it.

## 2019-09-07 NOTE — Progress Notes (Signed)
Progress Note  Patient Name: Aaron Ball Date of Encounter: 09/07/2019  Primary Cardiologist:[facility]  Subjective   Reports having significant problems with anxiety Long discussion with him, reports he takes Xanax every day around 5 PM so he can sleep This hospitalization has not been receiving Xanax on a regular basis Every night wakes up very anxious despite wearing his CPAP.  Feels this is also affecting his heart rate at nighttime In the daytime is asymptomatic, able to exert himself without tachycardia, shortness of breath Tolerating Eliquis, metoprolol succinate 100 daily, diltiazem extended release 300 daily  Inpatient Medications    Scheduled Meds: . allopurinol  300 mg Oral Daily  . apixaban  5 mg Oral BID  . atorvastatin  40 mg Oral Daily  . Chlorhexidine Gluconate Cloth  6 each Topical Daily  . diltiazem  300 mg Oral Daily  . gabapentin  300 mg Oral QHS  . influenza vaccine adjuvanted  0.5 mL Intramuscular Tomorrow-1000  . loratadine  10 mg Oral QHS  . metoprolol succinate  100 mg Oral BID  . pneumococcal 23 valent vaccine  0.5 mL Intramuscular Tomorrow-1000  . traZODone  50 mg Oral QHS   Continuous Infusions:  PRN Meds: acetaminophen **OR** acetaminophen, ALPRAZolam, bisacodyl, magnesium citrate, metoprolol tartrate, nitroGLYCERIN, ondansetron **OR** ondansetron (ZOFRAN) IV, polyethylene glycol, simethicone, tiZANidine, traMADol   Vital Signs    Vitals:   09/07/19 0803 09/07/19 0852 09/07/19 0857 09/07/19 0933  BP: 133/80     Pulse: 70 (!) 151 (!) 118 (!) 107  Resp: 18     Temp: 98.2 F (36.8 C)     TempSrc: Oral     SpO2: 94%     Weight:      Height:        Intake/Output Summary (Last 24 hours) at 09/07/2019 1316 Last data filed at 09/07/2019 0500 Gross per 24 hour  Intake --  Output 750 ml  Net -750 ml   Last 3 Weights 09/07/2019 09/06/2019 09/05/2019  Weight (lbs) 318 lb 12.8 oz 323 lb 6.4 oz 321 lb 3.2 oz  Weight (kg) 144.607 kg  146.693 kg 145.695 kg      Telemetry    Atrial flutter rate 100 up to 130- Personally Reviewed Was 100 at the time of my exam  ECG     - Personally Reviewed  Physical Exam   GEN: No acute distress.   Neck: No JVD Cardiac:  Irregularly irregular, no murmurs, rubs, or gallops.  Respiratory: Clear to auscultation bilaterally. GI: Soft, nontender, non-distended  MS: No edema; No deformity. Neuro:  Nonfocal  Psych: Normal affect   Labs    High Sensitivity Troponin:   Recent Labs  Lab 09/03/19 0955 09/03/19 1218  TROPONINIHS 6 4      Chemistry Recent Labs  Lab 09/04/19 0511 09/05/19 0444 09/06/19 0256  NA 136 138 137  K 4.4 4.4 4.5  CL 100 101 101  CO2 24 28 25   GLUCOSE 170* 165* 149*  BUN 20 22 26*  CREATININE 1.23 1.45* 1.38*  CALCIUM 8.9 8.8* 8.6*  GFRNONAA 60* 49* 52*  GFRAA >60 57* >60  ANIONGAP 12 9 11      Hematology Recent Labs  Lab 09/03/19 0955 09/04/19 0511 09/07/19 0602  WBC 6.6 9.4 10.2  RBC 4.38 4.23 3.97*  HGB 13.2 12.7* 11.9*  HCT 42.2 37.9* 36.8*  MCV 96.3 89.6 92.7  MCH 30.1 30.0 30.0  MCHC 31.3 33.5 32.3  RDW 14.5 14.3 14.6  PLT 249  268 235    BNP Recent Labs  Lab 09/03/19 0955  BNP 125.0*     DDimer No results for input(s): DDIMER in the last 168 hours.   Radiology    No results found.  Cardiac Studies     Patient Profile     68 y.o. male with history of CAD status post PCI to the proximal RCA in 1997 complicated by ISR in 2003 and 2014 status post PCI/DES to the mid RCA in 05/2013, persistent A. fib status post prior ablation x2 in 2010 and 2012, HFpEF, paroxysmal SVT, DM2, COPD quitting tobacco use in 2005, morbid obesity status post sleeve gastrectomy, OHS/OSA on CPAP, hyperparathyroidism, and GERDwho is being seen today for the evaluation of atrial flutter with RVR.  Assessment & Plan    Atrial flutter with rapid ventricular rate Variable rate overnight up to 150, down to 90-100 this morning Reports  improved symptoms --We will continue Eliquis Rate improved on diltiazem and metoprolol digoxin Relatively asymptomatic Significant anxiety at night, has not been on Xanax this admission Given room on blood pressure and periodic increase in heart rates at nighttime in the setting of anxiety, we will increase metoprolol succinate dosing 100 twice daily, continue diltiazem -Digoxin 0.125 daily starting tomorrow morning  Pulmonary edema, diastolic CHF secondary to atrial flutter Lasix on hold given slight climbing renal function Is not symptomatic  Paroxysmal atrial fibrillation Prior ablation x2  Anxiety Major issue, takes Xanax every night for the past year Asking for Xanax this morning for anxiety, unrelated to heart rate or shortness of breath symptoms  Long discussion with him concerning his anxiety symptoms, history of taking Xanax among other benzodiazepines Medications have been provided by primary care Discussed effect of anxiety on his shortness of breath symptoms at nighttime Discussed with hospitalist service  Total encounter time more than 35 minutes  Greater than 50% was spent in counseling and coordination of care with the patient   For questions or updates, please contact [facility] Please consult [email or web address] for contact info under        Signed, Julien Nordmann, MD  09/07/2019, 1:16 PM

## 2019-09-07 NOTE — Progress Notes (Signed)
Pt sleeping and ciontinues to have HR as high as 150. Given IV lopressor 5 mg . HR response down to 104-114.

## 2019-09-07 NOTE — Progress Notes (Signed)
PROGRESS NOTE    Lavina HammanWilliam Toney Buechner  RUE:[id number] DOB: 11-09-1950 DOA: 09/03/2019 PCP: System, Pcp Not In    Brief Narrative:  Fredderick SeveranceWilliam Toney Hillis a 68 y.o.malewithhistory of multiple comorbidities as outlined below, including coronary artery disease status post multiple stents, persistent A. fib status post ablation x2, type 2 diabetes, morbid obesity status post sleeve gastrectomy, OHS/OSA on CPAP, COPD.He presented to the ED today with persistent palpitations and heart racing.Found with atypical flutter on cardizem gtt. Covid negative.   Consultants:  Cardiology  Procedures:Echo  Antimicrobials:  None  Subjective: HR this am 150's. Given iv metoprolol with relief. Now at rest HR 90-105 on tele. Pt feels tachy at times. Intermittent doe but not worsened.    Objective: Vitals:   09/07/19 0803 09/07/19 0852 09/07/19 0857 09/07/19 0933  BP: 133/80     Pulse: 70 (!) 151 (!) 118 (!) 107  Resp: 18     Temp: 98.2 F (36.8 C)     TempSrc: Oral     SpO2: 94%     Weight:      Height:        Intake/Output Summary (Last 24 hours) at 09/07/2019 1150 Last data filed at 09/07/2019 0500 Gross per 24 hour  Intake --  Output 750 ml  Net -750 ml   Filed Weights   09/05/19 0350 09/06/19 0402 09/07/19 0458  Weight: (!) 145.7 kg (!) 146.7 kg (!) 144.6 kg    Examination: General exam: Appears calm and comfortable , nad,  Respiratory system: cta, no w/r/r Cardiovascular system:Irregular,  S1 & S2 heard,no murmurs, rubs, gallops . Gastrointestinal system: Abdomen is nondistended, soft and nontender.  Normal bowel sounds heard. Central nervous system: Alert and oriented x3. No focal neurological deficits. Extremities: edema b/l , none cyanotic Skin: Warm and dry Psychiatry: Judgement and insight appear normal. Mood & affect appropriate.    Data Reviewed: I have personally reviewed following labs and imaging studies  CBC: Recent Labs  Lab 09/03/19 0955  09/04/19 0511 09/07/19 0602  WBC 6.6 9.4 10.2  HGB 13.2 12.7* 11.9*  HCT 42.2 37.9* 36.8*  MCV 96.3 89.6 92.7  PLT 249 268 235   Basic Metabolic Panel: Recent Labs  Lab 09/03/19 0955 09/04/19 0511 09/05/19 0444 09/06/19 0256  NA 138 136 138 137  K 4.4 4.4 4.4 4.5  CL 100 100 101 101  CO2 25 24 28 25   GLUCOSE 142* 170* 165* 149*  BUN 26* 20 22 26*  CREATININE 1.37* 1.23 1.45* 1.38*  CALCIUM 9.1 8.9 8.8* 8.6*  MG  --  1.9  --   --    GFR: Estimated Creatinine Clearance: 68.6 mL/min (A) (by C-G formula based on SCr of 1.38 mg/dL (H)). Liver Function Tests: No results for input(s): AST, ALT, ALKPHOS, BILITOT, PROT, ALBUMIN in the last 168 hours. No results for input(s): LIPASE, AMYLASE in the last 168 hours. No results for input(s): AMMONIA in the last 168 hours. Coagulation Profile: Recent Labs  Lab 09/03/19 1925  INR 1.0   Cardiac Enzymes: No results for input(s): CKTOTAL, CKMB, CKMBINDEX, TROPONINI in the last 168 hours. BNP (last 3 results) No results for input(s): PROBNP in the last 8760 hours. HbA1C: No results for input(s): HGBA1C in the last 72 hours. CBG: Recent Labs  Lab 09/03/19 2032  GLUCAP 152*   Lipid Profile: No results for input(s): CHOL, HDL, LDLCALC, TRIG, CHOLHDL, LDLDIRECT in the last 72 hours. Thyroid Function Tests: No results for input(s): TSH, T4TOTAL, FREET4, T3FREE,  THYROIDAB in the last 72 hours. Anemia Panel: No results for input(s): VITAMINB12, FOLATE, FERRITIN, TIBC, IRON, RETICCTPCT in the last 72 hours. Sepsis Labs: No results for input(s): PROCALCITON, LATICACIDVEN in the last 168 hours.  Recent Results (from the past 240 hour(s))  SARS CORONAVIRUS 2 (TAT 6-24 HRS) Nasopharyngeal Nasopharyngeal Swab     Status: None   Collection Time: 09/03/19 11:05 AM   Specimen: Nasopharyngeal Swab  Result Value Ref Range Status   SARS Coronavirus 2 NEGATIVE NEGATIVE Final    Comment: (NOTE) SARS-CoV-2 target nucleic acids are NOT  DETECTED. The SARS-CoV-2 RNA is generally detectable in upper and lower respiratory specimens during the acute phase of infection. Negative results do not preclude SARS-CoV-2 infection, do not rule out co-infections with other pathogens, and should not be used as the sole basis for treatment or other patient management decisions. Negative results must be combined with clinical observations, patient history, and epidemiological information. The expected result is Negative. Fact Sheet for Patients: SugarRoll.be Fact Sheet for Healthcare Providers: https://www.woods-mathews.com/ This test is not yet approved or cleared by the [place] FDA and  has been authorized for detection and/or diagnosis of SARS-CoV-2 by FDA under an Emergency Use Authorization (EUA). This EUA will remain  in effect (meaning this test can be used) for the duration of the COVID-19 declaration under Section 56 4(b)(1) of the Act, 21 U.S.C. section 360bbb-3(b)(1), unless the authorization is terminated or revoked sooner. Performed at [facility], [place] [address], [place], [place] [address]          Radiology Studies: No results found.      Scheduled Meds: . allopurinol  300 mg Oral Daily  . apixaban  5 mg Oral BID  . atorvastatin  40 mg Oral Daily  . Chlorhexidine Gluconate Cloth  6 each Topical Daily  . diltiazem  300 mg Oral Daily  . gabapentin  300 mg Oral QHS  . influenza vaccine adjuvanted  0.5 mL Intramuscular Tomorrow-1000  . loratadine  10 mg Oral QHS  . metoprolol succinate  100 mg Oral Daily  . pneumococcal 23 valent vaccine  0.5 mL Intramuscular Tomorrow-1000  . traZODone  50 mg Oral QHS   Continuous Infusions:  Assessment & Plan:   Principal Problem:   Atrial flutter (HCC) Active Problems:   CAD (coronary artery disease)   Hyperlipidemia   HTN (hypertension)   AKI (acute kidney injury) (HCC)   Atrial tachycardia (HCC)   OSA  (obstructive sleep apnea)   COPD (chronic obstructive pulmonary disease) (HCC)   Chronic back pain   Gout   Type 2 diabetes mellitus (HCC)   Allergic rhinitis   Pulmonary edema   Morbid obesity (HCC)   Atypical atrial flutter with RVR-heart rate still not well controlled --Cardiology following- plan for TEE with cardioversion on Monday. -Continue with Cardizem 300 -Metoprolol. XL 100mg  to be continued Continue with digoxin Will need long-term anticoagulant  CAD (coronary artery disease)-stable --Continue aspirin and Plavix, sublingual nitro as needed  HTN (hypertension) -Continue home medications  Acute DHF/pulmonary edema- DC'd Lasix Echo 50 to 55% More euvolemic, but will monitor  AKI-mildly elevated 2/2 diuresis D/c'd  lasix.  Improving.  Type 2 diabetes mellitus (HCC) Last A1c in epic was 6.9 back in 2017. Currently does not appear to be on any medications for this. --monitor fs, riss  Obesity Status post sleeve gastrectomy --Counseled on diet and lifestyle for weight loss  Hyperlipidemia -Continue home Lipitor  Chronic back pain -Continue home  tramadol  Gout-not acutely flared -Continue home allopurinol  Allergic rhinitis -Continue home loratadine  OSA (obstructive sleep apnea)/ OHS (obesity hypoventilation syndrome) --on CPAP, ordered here.  COPD (chronic obstructive pulmonary disease) (HCC)- Stable without exacerbation  Lower extremity edema -Continue home Lasix as needed  hx/o paf-x2 ablation in the past    DVT prophylaxis:Eliquis Code Status:Full Family Communication:None at bedside  disposition Plan:We will be here 2 days as he will require TEE cardioversion on monday 6 currently not well controlled.  Will need home PT/OT     LOS: 3 days   Time spent: 45 minutes with more than 50% on COC    Lynn Ito, MD [facility] Pager [phone]  If 7PM-7AM, please contact  night-coverage [email or web address] Password [facility] 09/07/2019, 11:50 AM Patient ID: Meryl Ponder, male   DOB: 09/26/1950, 68 y.o.   MRN: [id number]

## 2019-09-07 NOTE — Progress Notes (Signed)
Pt c/o a sensation of anxiety; states he awoke from sleep and heart was racing. HR range is 112-135. Pt was given metoprolol 5 mg earlier this evening around MN as HR was around 150, with some effect, HR down to low 100's. VSS, lungs are CTA. O2 3 Liters placed due to sensation of SOB. Pt was also given Xanax 0.5 mg. Pt states he takes Xanax at home. Will CTM.

## 2019-09-07 NOTE — Progress Notes (Signed)
Patient's HR 150's, IV metoprolol given with relief. HR 100-110 afterwards.

## 2019-09-08 DIAGNOSIS — E782 Mixed hyperlipidemia: Secondary | ICD-10-CM

## 2019-09-08 DIAGNOSIS — J811 Chronic pulmonary edema: Secondary | ICD-10-CM

## 2019-09-08 DIAGNOSIS — M109 Gout, unspecified: Secondary | ICD-10-CM

## 2019-09-08 LAB — CBC
HCT: 37.7 % — ABNORMAL LOW (ref 39.0–52.0)
Hemoglobin: 11.8 g/dL — ABNORMAL LOW (ref 13.0–17.0)
MCH: 30.2 pg (ref 26.0–34.0)
MCHC: 31.3 g/dL (ref 30.0–36.0)
MCV: 96.4 fL (ref 80.0–100.0)
Platelets: 231 10*3/uL (ref 150–400)
RBC: 3.91 MIL/uL — ABNORMAL LOW (ref 4.22–5.81)
RDW: 14.6 % (ref 11.5–15.5)
WBC: 10.3 10*3/uL (ref 4.0–10.5)
nRBC: 0 % (ref 0.0–0.2)

## 2019-09-08 LAB — BASIC METABOLIC PANEL
Anion gap: 12 (ref 5–15)
BUN: 21 mg/dL (ref 8–23)
CO2: 23 mmol/L (ref 22–32)
Calcium: 8.6 mg/dL — ABNORMAL LOW (ref 8.9–10.3)
Chloride: 101 mmol/L (ref 98–111)
Creatinine, Ser: 1.13 mg/dL (ref 0.61–1.24)
GFR calc Af Amer: >60 mL/min (ref >60)
GFR calc non Af Amer: >60 mL/min (ref >60)
Glucose, Bld: 158 mg/dL — ABNORMAL HIGH (ref 70–99)
Potassium: 4.2 mmol/L (ref 3.5–5.1)
Sodium: 136 mmol/L (ref 135–145)

## 2019-09-08 MED ORDER — DILTIAZEM HCL 30 MG PO TABS
60.0000 mg | ORAL_TABLET | Freq: Once | ORAL | Status: AC
  Administered 2019-09-08: 10:00:00 60 mg via ORAL
  Filled 2019-09-08: qty 2

## 2019-09-08 MED ORDER — DILTIAZEM HCL ER COATED BEADS 180 MG PO CP24
360.0000 mg | ORAL_CAPSULE | Freq: Every day | ORAL | Status: DC
  Filled 2019-09-08: qty 2

## 2019-09-08 NOTE — Progress Notes (Signed)
Progress Note  Patient Name: Aaron Ball Date of Encounter: 09/08/2019  Primary Cardiologist:[facility]  Subjective   Reports he did not sleep well last night, CPAP does not seem to work for him He does not have his own machine.  Hospital machine " blowing too hard at him when he tries to breathe out" Denies significant shortness of breath or chest discomfort He does appreciate some palpitations when heart rate up to 130 or higher Received diltiazem 300 mg oral dosing every morning Metoprolol succinate 100 also every morning with Eliquis twice daily Did not receive extra metoprolol succinate yesterday p.m. Receiving metoprolol IV push for rate control overnight, minimal improvement in rate, still variable 100 up to 150 at times  Inpatient Medications    Scheduled Meds: . allopurinol  300 mg Oral Daily  . ALPRAZolam  0.25 mg Oral Daily  . apixaban  5 mg Oral BID  . atorvastatin  40 mg Oral Daily  . Chlorhexidine Gluconate Cloth  6 each Topical Daily  . digoxin  0.125 mg Oral Daily  . [START ON 09/09/2019] diltiazem  360 mg Oral Daily  . gabapentin  300 mg Oral QHS  . influenza vaccine adjuvanted  0.5 mL Intramuscular Tomorrow-1000  . loratadine  10 mg Oral QHS  . metoprolol succinate  100 mg Oral BID  . pneumococcal 23 valent vaccine  0.5 mL Intramuscular Tomorrow-1000  . traZODone  50 mg Oral QHS   Continuous Infusions:  PRN Meds: acetaminophen **OR** acetaminophen, bisacodyl, magnesium citrate, metoprolol tartrate, nitroGLYCERIN, ondansetron **OR** ondansetron (ZOFRAN) IV, polyethylene glycol, simethicone, tiZANidine, traMADol   Vital Signs    Vitals:   09/08/19 0800 09/08/19 0810 09/08/19 0816 09/08/19 1048  BP: 111/78 133/86    Pulse: 81 77 (!) 144 (!) 102  Resp: 20 19    Temp: 97.6 F (36.4 C) 97.8 F (36.6 C)    TempSrc: Oral Oral    SpO2: 96% 98%    Weight:      Height:        Intake/Output Summary (Last 24 hours) at 09/08/2019 1436 Last data filed  at 09/07/2019 1900 Gross per 24 hour  Intake --  Output 150 ml  Net -150 ml   Last 3 Weights 09/08/2019 09/07/2019 09/06/2019  Weight (lbs) 313 lb 15.2 oz 318 lb 12.8 oz 323 lb 6.4 oz  Weight (kg) 142.407 kg 144.607 kg 146.693 kg      Telemetry    Atrial flutter rate 100 up to 150- Personally Reviewed During my exam was 100  ECG     - Personally Reviewed  Physical Exam   GEN: No acute distress.  Obese Neck: No JVD Cardiac:  Tachycardic, irregular no murmurs, rubs, or gallops.  Respiratory: Clear to auscultation bilaterally. GI: Soft, nontender, non-distended  MS: No edema; No deformity. Neuro:  Nonfocal  Psych: Normal affect   Labs    High Sensitivity Troponin:   Recent Labs  Lab 09/03/19 0955 09/03/19 1218  TROPONINIHS 6 4      Chemistry Recent Labs  Lab 09/05/19 0444 09/06/19 0256 09/08/19 0601  NA 138 137 136  K 4.4 4.5 4.2  CL 101 101 101  CO2 28 25 23   GLUCOSE 165* 149* 158*  BUN 22 26* 21  CREATININE 1.45* 1.38* 1.13  CALCIUM 8.8* 8.6* 8.6*  GFRNONAA 49* 52* >60  GFRAA 57* >60 >60  ANIONGAP 9 11 12      Hematology Recent Labs  Lab 09/04/19 0511 09/07/19 0602 09/08/19 0601  WBC 9.4 10.2 10.3  RBC 4.23 3.97* 3.91*  HGB 12.7* 11.9* 11.8*  HCT 37.9* 36.8* 37.7*  MCV 89.6 92.7 96.4  MCH 30.0 30.0 30.2  MCHC 33.5 32.3 31.3  RDW 14.3 14.6 14.6  PLT 268 235 231    BNP Recent Labs  Lab 09/03/19 0955  BNP 125.0*     DDimer No results for input(s): DDIMER in the last 168 hours.   Radiology    No results found.  Cardiac Studies   Echocardiogram September 04, 2019 Normal LV function 55 to 60% ejection fraction Challenging image quality  Patient Profile     68 y.o. male with history of CAD status post PCI to the proximal RCA in 1997 complicated by ISR in 2003 and 2014 status post PCI/DES to the mid RCA in 05/2013, persistent A. fib status post prior ablation x2 in 2010 and 2012, HFpEF, paroxysmal SVT, DM2, COPD quitting tobacco  use in 2005, morbid obesity status post sleeve gastrectomy, OHS/OSA on CPAP, hyperparathyroidism, and GERDwho is being seen today for the evaluation of atrial flutter with RVR.  Assessment & Plan    Atrial flutter with rapid ventricular rate Despite escalating medical management, rate has been difficult to control Tolerating metoprolol succinate 100 daily, digoxin, diltiazem 300 extended release daily ----Long discussion with him concerning various treatment options After long discussion we will schedule transesophageal echo and cardioversion tomorrow with Dr. Okey Dupre Risk and benefit discussed with him -Orders placed, left messages with surgical scheduling, specials, echo technician, discussed with anesthesia (difficulty scheduling on a weekend for a weekday)  Pulmonary edema, diastolic CHF secondary to atrial flutter In the setting of tachyarrhythmia Received several doses of Lasix earlier in his hospitalization, slight climbing creatinine, Lasix held He denies significant shortness of breath, denies leg swelling or abdominal bloating -We will continue to monitor  Paroxysmal atrial fibrillation Prior ablation x2 Most recent arrhythmia is flutter  Anxiety Reports that he takes Xanax on a regular basis at 5 or 6:00 every evening Provided by primary care  Obstructive sleep apnea Reports he is unable to tolerate her CPAP He does not have anyone at home that can bring in his machine Poor sleep hygiene likely contributing to challenging to control heart rate at nighttime in particular  Long discussion concerning risk and benefit of procedure scheduled for tomorrow, details of the procedure transesophageal echo and cardioversion, he has anxiety concerning the procedures  Total encounter time more than 35 minutes  Greater than 50% was spent in counseling and coordination of care with the patient   For questions or updates, please contact [facility] Please consult [email or web address] for  contact info under        Signed, Julien Nordmann, MD  09/08/2019, 2:36 PM

## 2019-09-08 NOTE — H&P (View-Only) (Signed)
Progress Note  Patient Name: Aaron Ball Date of Encounter: 09/08/2019  Primary Cardiologist:[facility]  Subjective   Reports he did not sleep well last night, CPAP does not seem to work for him He does not have his own machine.  Hospital machine " blowing too hard at him when he tries to breathe out" Denies significant shortness of breath or chest discomfort He does appreciate some palpitations when heart rate up to 130 or higher Received diltiazem 300 mg oral dosing every morning Metoprolol succinate 100 also every morning with Eliquis twice daily Did not receive extra metoprolol succinate yesterday p.m. Receiving metoprolol IV push for rate control overnight, minimal improvement in rate, still variable 100 up to 150 at times  Inpatient Medications    Scheduled Meds: . allopurinol  300 mg Oral Daily  . ALPRAZolam  0.25 mg Oral Daily  . apixaban  5 mg Oral BID  . atorvastatin  40 mg Oral Daily  . Chlorhexidine Gluconate Cloth  6 each Topical Daily  . digoxin  0.125 mg Oral Daily  . [START ON 09/09/2019] diltiazem  360 mg Oral Daily  . gabapentin  300 mg Oral QHS  . influenza vaccine adjuvanted  0.5 mL Intramuscular Tomorrow-1000  . loratadine  10 mg Oral QHS  . metoprolol succinate  100 mg Oral BID  . pneumococcal 23 valent vaccine  0.5 mL Intramuscular Tomorrow-1000  . traZODone  50 mg Oral QHS   Continuous Infusions:  PRN Meds: acetaminophen **OR** acetaminophen, bisacodyl, magnesium citrate, metoprolol tartrate, nitroGLYCERIN, ondansetron **OR** ondansetron (ZOFRAN) IV, polyethylene glycol, simethicone, tiZANidine, traMADol   Vital Signs    Vitals:   09/08/19 0800 09/08/19 0810 09/08/19 0816 09/08/19 1048  BP: 111/78 133/86    Pulse: 81 77 (!) 144 (!) 102  Resp: 20 19    Temp: 97.6 F (36.4 C) 97.8 F (36.6 C)    TempSrc: Oral Oral    SpO2: 96% 98%    Weight:      Height:        Intake/Output Summary (Last 24 hours) at 09/08/2019 1436 Last data filed  at 09/07/2019 1900 Gross per 24 hour  Intake --  Output 150 ml  Net -150 ml   Last 3 Weights 09/08/2019 09/07/2019 09/06/2019  Weight (lbs) 313 lb 15.2 oz 318 lb 12.8 oz 323 lb 6.4 oz  Weight (kg) 142.407 kg 144.607 kg 146.693 kg      Telemetry    Atrial flutter rate 100 up to 150- Personally Reviewed During my exam was 100  ECG     - Personally Reviewed  Physical Exam   GEN: No acute distress.  Obese Neck: No JVD Cardiac:  Tachycardic, irregular no murmurs, rubs, or gallops.  Respiratory: Clear to auscultation bilaterally. GI: Soft, nontender, non-distended  MS: No edema; No deformity. Neuro:  Nonfocal  Psych: Normal affect   Labs    High Sensitivity Troponin:   Recent Labs  Lab 09/03/19 0955 09/03/19 1218  TROPONINIHS 6 4      Chemistry Recent Labs  Lab 09/05/19 0444 09/06/19 0256 09/08/19 0601  NA 138 137 136  K 4.4 4.5 4.2  CL 101 101 101  CO2 28 25 23   GLUCOSE 165* 149* 158*  BUN 22 26* 21  CREATININE 1.45* 1.38* 1.13  CALCIUM 8.8* 8.6* 8.6*  GFRNONAA 49* 52* >60  GFRAA 57* >60 >60  ANIONGAP 9 11 12      Hematology Recent Labs  Lab 09/04/19 0511 09/07/19 0602 09/08/19 0601  WBC 9.4 10.2 10.3  RBC 4.23 3.97* 3.91*  HGB 12.7* 11.9* 11.8*  HCT 37.9* 36.8* 37.7*  MCV 89.6 92.7 96.4  MCH 30.0 30.0 30.2  MCHC 33.5 32.3 31.3  RDW 14.3 14.6 14.6  PLT 268 235 231    BNP Recent Labs  Lab 09/03/19 0955  BNP 125.0*     DDimer No results for input(s): DDIMER in the last 168 hours.   Radiology    No results found.  Cardiac Studies   Echocardiogram September 04, 2019 Normal LV function 55 to 60% ejection fraction Challenging image quality  Patient Profile     68 y.o. male with history of CAD status post PCI to the proximal RCA in 1997 complicated by ISR in 2003 and 2014 status post PCI/DES to the mid RCA in 05/2013, persistent A. fib status post prior ablation x2 in 2010 and 2012, HFpEF, paroxysmal SVT, DM2, COPD quitting tobacco  use in 2005, morbid obesity status post sleeve gastrectomy, OHS/OSA on CPAP, hyperparathyroidism, and GERDwho is being seen today for the evaluation of atrial flutter with RVR.  Assessment & Plan    Atrial flutter with rapid ventricular rate Despite escalating medical management, rate has been difficult to control Tolerating metoprolol succinate 100 daily, digoxin, diltiazem 300 extended release daily ----Long discussion with him concerning various treatment options After long discussion we will schedule transesophageal echo and cardioversion tomorrow with Dr. Okey Dupre Risk and benefit discussed with him -Orders placed, left messages with surgical scheduling, specials, echo technician, discussed with anesthesia (difficulty scheduling on a weekend for a weekday)  Pulmonary edema, diastolic CHF secondary to atrial flutter In the setting of tachyarrhythmia Received several doses of Lasix earlier in his hospitalization, slight climbing creatinine, Lasix held He denies significant shortness of breath, denies leg swelling or abdominal bloating -We will continue to monitor  Paroxysmal atrial fibrillation Prior ablation x2 Most recent arrhythmia is flutter  Anxiety Reports that he takes Xanax on a regular basis at 5 or 6:00 every evening Provided by primary care  Obstructive sleep apnea Reports he is unable to tolerate her CPAP He does not have anyone at home that can bring in his machine Poor sleep hygiene likely contributing to challenging to control heart rate at nighttime in particular  Long discussion concerning risk and benefit of procedure scheduled for tomorrow, details of the procedure transesophageal echo and cardioversion, he has anxiety concerning the procedures  Total encounter time more than 35 minutes  Greater than 50% was spent in counseling and coordination of care with the patient   For questions or updates, please contact [facility] Please consult [email or web address] for  contact info under        Signed, Julien Nordmann, MD  09/08/2019, 2:36 PM

## 2019-09-08 NOTE — Plan of Care (Signed)

## 2019-09-08 NOTE — Progress Notes (Signed)
PROGRESS NOTE    Clever Geraldo  XBL:[id number] DOB: [birth year] DOA: 09/03/2019 PCP: System, Pcp Not In    Brief Narrative:  [name] a 68 y.o.malewithhistory of multiple comorbidities as outlined below, including coronary artery disease status post multiple stents, persistent A. fib status post ablation x2, type 2 diabetes, morbid obesity status post sleeve gastrectomy, OHS/OSA on CPAP, COPD.He presented to the ED today with persistent palpitations and heart racing.Found with atypical flutter on cardizem gtt. Covid negative.   Consultants:  Cardiology  Procedures:Echo  Antimicrobials:  None  Subjective: At rest not anxious heart rate still tacy.  Patient feels the palpitations.  No shortness of breath at rest but dyspnea on exertion. We discussed his Xanax dosing, as verified by pharmacy  He takes it as prn at home and not standing dose. Still not wearing the cpap here as he doesn't like this machine. No new complaints. Objective: Vitals:   09/08/19 0800 09/08/19 0810 09/08/19 0816 09/08/19 1048  BP: 111/78 133/86    Pulse: 81 77 (!) 144 (!) 102  Resp: 20 19    Temp: 97.6 F (36.4 C) 97.8 F (36.6 C)    TempSrc: Oral Oral    SpO2: 96% 98%    Weight:      Height:        Intake/Output Summary (Last 24 hours) at 09/08/2019 1321 Last data filed at 09/07/2019 1900 Gross per 24 hour  Intake --  Output 150 ml  Net -150 ml   Filed Weights   09/06/19 0402 09/07/19 0458 09/08/19 0512  Weight: (!) 146.7 kg (!) 144.6 kg (!) 142.4 kg    Examination: General exam: Appears calm and comfortable , nad, non anxious Respiratory system: cta, no w/r/r Cardiovascular system:Irregular,  Tachycardic S1 & S2 heard,no murmurs, rubs, gallops . Gastrointestinal system: Abdomen is nondistended, soft and nontender.  Normal bowel sounds heard. Central nervous system: Alert and oriented x3. No focal neurological deficits. Extremities: edema b/l , none  cyanotic, chronic skin changes Skin: Warm and dry Psychiatry: Judgement and insight appear normal. Mood & affect appropriate.    Data Reviewed: I have personally reviewed following labs and imaging studies  CBC: Recent Labs  Lab 09/03/19 0955 09/04/19 0511 09/07/19 0602 09/08/19 0601  WBC 6.6 9.4 10.2 10.3  HGB 13.2 12.7* 11.9* 11.8*  HCT 42.2 37.9* 36.8* 37.7*  MCV 96.3 89.6 92.7 96.4  PLT 249 268 235 231   Basic Metabolic Panel: Recent Labs  Lab 09/03/19 0955 09/04/19 0511 09/05/19 0444 09/06/19 0256 09/08/19 0601  NA 138 136 138 137 136  K 4.4 4.4 4.4 4.5 4.2  CL 100 100 101 101 101  CO2 25 24 28 25 23   GLUCOSE 142* 170* 165* 149* 158*  BUN 26* 20 22 26* 21  CREATININE 1.37* 1.23 1.45* 1.38* 1.13  CALCIUM 9.1 8.9 8.8* 8.6* 8.6*  MG  --  1.9  --   --   --    GFR: Estimated Creatinine Clearance: 83.1 mL/min (by C-G formula based on SCr of 1.13 mg/dL). Liver Function Tests: No results for input(s): AST, ALT, ALKPHOS, BILITOT, PROT, ALBUMIN in the last 168 hours. No results for input(s): LIPASE, AMYLASE in the last 168 hours. No results for input(s): AMMONIA in the last 168 hours. Coagulation Profile: Recent Labs  Lab 09/03/19 1925  INR 1.0   Cardiac Enzymes: No results for input(s): CKTOTAL, CKMB, CKMBINDEX, TROPONINI in the last 168 hours. BNP (last 3 results) No results for input(s): PROBNP  in the last 8760 hours. HbA1C: No results for input(s): HGBA1C in the last 72 hours. CBG: Recent Labs  Lab 09/03/19 2032  GLUCAP 152*   Lipid Profile: No results for input(s): CHOL, HDL, LDLCALC, TRIG, CHOLHDL, LDLDIRECT in the last 72 hours. Thyroid Function Tests: No results for input(s): TSH, T4TOTAL, FREET4, T3FREE, THYROIDAB in the last 72 hours. Anemia Panel: No results for input(s): VITAMINB12, FOLATE, FERRITIN, TIBC, IRON, RETICCTPCT in the last 72 hours. Sepsis Labs: No results for input(s): PROCALCITON, LATICACIDVEN in the last 168 hours.  Recent  Results (from the past 240 hour(s))  SARS CORONAVIRUS 2 (TAT 6-24 HRS) Nasopharyngeal Nasopharyngeal Swab     Status: None   Collection Time: 09/03/19 11:05 AM   Specimen: Nasopharyngeal Swab  Result Value Ref Range Status   SARS Coronavirus 2 NEGATIVE NEGATIVE Final    Comment: (NOTE) SARS-CoV-2 target nucleic acids are NOT DETECTED. The SARS-CoV-2 RNA is generally detectable in upper and lower respiratory specimens during the acute phase of infection. Negative results do not preclude SARS-CoV-2 infection, do not rule out co-infections with other pathogens, and should not be used as the sole basis for treatment or other patient management decisions. Negative results must be combined with clinical observations, patient history, and epidemiological information. The expected result is Negative. Fact Sheet for Patients: SugarRoll.be Fact Sheet for Healthcare Providers: https://www.woods-mathews.com/ This test is not yet approved or cleared by the [place] FDA and  has been authorized for detection and/or diagnosis of SARS-CoV-2 by FDA under an Emergency Use Authorization (EUA). This EUA will remain  in effect (meaning this test can be used) for the duration of the COVID-19 declaration under Section 56 4(b)(1) of the Act, 21 U.S.C. section 360bbb-3(b)(1), unless the authorization is terminated or revoked sooner. Performed at [facility], [place] [address] [place], [place] [address]          Radiology Studies: No results found.      Scheduled Meds: . allopurinol  300 mg Oral Daily  . ALPRAZolam  0.25 mg Oral Daily  . apixaban  5 mg Oral BID  . atorvastatin  40 mg Oral Daily  . Chlorhexidine Gluconate Cloth  6 each Topical Daily  . digoxin  0.125 mg Oral Daily  . [START ON 09/09/2019] diltiazem  360 mg Oral Daily  . gabapentin  300 mg Oral QHS  . influenza vaccine adjuvanted  0.5 mL Intramuscular Tomorrow-1000  .  loratadine  10 mg Oral QHS  . metoprolol succinate  100 mg Oral BID  . pneumococcal 23 valent vaccine  0.5 mL Intramuscular Tomorrow-1000  . traZODone  50 mg Oral QHS   Continuous Infusions:  Assessment & Plan:   Principal Problem:   Atrial flutter (HCC) Active Problems:   CAD (coronary artery disease)   Hyperlipidemia   HTN (hypertension)   AKI (acute kidney injury) (HCC)   Atrial tachycardia (HCC)   OSA (obstructive sleep apnea)   COPD (chronic obstructive pulmonary disease) (HCC)   Chronic back pain   Gout   Type 2 diabetes mellitus (HCC)   Allergic rhinitis   Pulmonary edema   Morbid obesity (HCC)   Atypical atrial flutter with RVR-heart rate still not well controlled --Cardiology following- plan for TEE with cardioversion on Monday. -Since heart rate not well controlled we will increase Cardizem to 360 daily -Metoprolol. XL 100mg  twice daily  Continue with digoxin Will need long-term anticoagulant  CAD (coronary artery disease)-stable --Continue aspirin and Plavix, sublingual nitro as needed  HTN (hypertension) -Continue with Cardizem, beta-blocker  Acute DHF/pulmonary edema- DC'd Lasix Echo 50 to 55% More euvolemic, but will monitor   AKI-mildly elevated 2/2 diuresis D/c'd  lasix.  Improved  Type 2 diabetes mellitus (HCC) Last A1c in epic was 6.9 back in 2017. Currently does not appear to be on any medications for this. --monitor fs, riss  Obesity Status post sleeve gastrectomy --Counseled on diet and lifestyle for weight loss  Hyperlipidemia -Continue home Lipitor  Chronic back pain -Continue home tramadol  Gout-not acutely flared -Continue home allopurinol  Allergic rhinitis -Continue home loratadine  OSA (obstructive sleep apnea)/ OHS (obesity hypoventilation syndrome) --on CPAP, ordered here, but does not like the machine so refused it  COPD (chronic obstructive pulmonary disease) (HCC)- Stable without  exacerbation  Lower extremity edema -Continue home Lasix as prn  hx/o paf-x2 ablation in the past  Anxiety-on home xanax prn    DVT prophylaxis:Eliquis Code Status:Full Family Communication:None at bedside  disposition Plan:We will be here 2 days as he will require TEE cardioversion on monday 6 currently not well controlled.  Will need home PT/OT     LOS: 4 days   Time spent: 45 minutes with more than 50% on COC    Lynn ItoSahar Shahiem Bedwell, MD [facility] Pager [phone]  If 7PM-7AM, please contact night-coverage [email or web address] Password [email or web address] 09/08/2019, 1:21 PM Patient ID: Lavina HammanWilliam Toney Soland, male   DOB: 1951-07-21, 68 y.o.   MRN: [id number]

## 2019-09-09 ENCOUNTER — Inpatient Hospital Stay: Payer: Medicare Other | Admitting: Anesthesiology

## 2019-09-09 ENCOUNTER — Encounter
Admission: EM | Disposition: A | Discharge status: Home Health | Payer: Self-pay | Source: Home / Self Care | Attending: Internal Medicine

## 2019-09-09 ENCOUNTER — Inpatient Hospital Stay (HOSPITAL_COMMUNITY)
Admit: 2019-09-09 | Discharge: 2019-09-09 | Disposition: A | Discharge status: Home / Self Care | Payer: Medicare Other | Attending: Cardiovascular Disease | Admitting: Cardiovascular Disease

## 2019-09-09 DIAGNOSIS — I4891 Unspecified atrial fibrillation: Secondary | ICD-10-CM

## 2019-09-09 DIAGNOSIS — I361 Nonrheumatic tricuspid (valve) insufficiency: Secondary | ICD-10-CM

## 2019-09-09 DIAGNOSIS — G4733 Obstructive sleep apnea (adult) (pediatric): Secondary | ICD-10-CM

## 2019-09-09 HISTORY — PX: CARDIOVERSION: SHX1299

## 2019-09-09 HISTORY — PX: TEE WITHOUT CARDIOVERSION: SHX5443

## 2019-09-09 LAB — PROTIME-INR
INR: 1.4 — ABNORMAL HIGH (ref 0.8–1.2)
Prothrombin Time: 17.4 seconds — ABNORMAL HIGH (ref 11.4–15.2)

## 2019-09-09 LAB — GLUCOSE, CAPILLARY: Glucose-Capillary: 164 mg/dL — ABNORMAL HIGH (ref 70–99)

## 2019-09-09 SURGERY — ECHOCARDIOGRAM, TRANSESOPHAGEAL
Anesthesia: General

## 2019-09-09 SURGERY — CARDIOVERSION
Anesthesia: General

## 2019-09-09 MED ORDER — SODIUM CHLORIDE FLUSH 0.9 % IV SOLN
INTRAVENOUS | Status: AC
  Filled 2019-09-09: qty 10

## 2019-09-09 MED ORDER — PROPOFOL 500 MG/50ML IV EMUL
INTRAVENOUS | Status: AC
  Filled 2019-09-09: qty 50

## 2019-09-09 MED ORDER — SODIUM CHLORIDE 0.9% FLUSH
10.0000 mL | Freq: Two times a day (BID) | INTRAVENOUS | Status: DC
  Administered 2019-09-09 – 2019-09-11 (×4): 10 mL via INTRAVENOUS

## 2019-09-09 MED ORDER — LIDOCAINE VISCOUS HCL 2 % MT SOLN
OROMUCOSAL | Status: AC
  Filled 2019-09-09: qty 15

## 2019-09-09 MED ORDER — SODIUM CHLORIDE 0.9 % IV SOLN: INTRAVENOUS | Status: DC

## 2019-09-09 MED ORDER — METOPROLOL SUCCINATE ER 25 MG PO TB24
25.0000 mg | ORAL_TABLET | Freq: Two times a day (BID) | ORAL | Status: DC
  Administered 2019-09-09: 25 mg via ORAL
  Filled 2019-09-09 (×2): qty 1

## 2019-09-09 MED ORDER — METOPROLOL SUCCINATE ER 50 MG PO TB24: 50.0000 mg | ORAL_TABLET | Freq: Two times a day (BID) | ORAL | Status: DC

## 2019-09-09 MED ORDER — BUTAMBEN-TETRACAINE-BENZOCAINE 2-2-14 % EX AERO
INHALATION_SPRAY | CUTANEOUS | Status: AC
  Filled 2019-09-09: qty 5

## 2019-09-09 MED ORDER — PROPOFOL 10 MG/ML IV BOLUS
INTRAVENOUS | Status: DC | PRN
  Administered 2019-09-09: 30 mg via INTRAVENOUS
  Administered 2019-09-09: 20 mg via INTRAVENOUS
  Administered 2019-09-09: 30 mg via INTRAVENOUS
  Administered 2019-09-09: 20 mg via INTRAVENOUS
  Administered 2019-09-09: 50 mg via INTRAVENOUS

## 2019-09-09 NOTE — Anesthesia Post-op Follow-up Note (Signed)
Anesthesia QCDR form completed.        

## 2019-09-09 NOTE — Progress Notes (Signed)
Physical Therapy Treatment Patient Details Name: Aaron Ball MRN: [id number] DOB: 26-Apr-1951 Today's Date: 09/09/2019    History of Present Illness Patient is a 68 year old male admitted with atrial tachycardia. PMH includes: obesity, CAD, HLD, HTN, COPD, DM, chronic back pain.    PT Comments    Pt feeling good in recliner.  Was able to stand and walk to door and then to bedside commode for small BM and void.  After care provided, he was able to walk x 2 to door.  Remained in recliner after session.  HR stable with gait.  O2 dipped briefly to 89 after longer walk but returned quickly to baseline.  Pt did report pain L toe which he felt was gout related.  Checked with RN per his request and he was given his gout medicine this am.      Follow Up Recommendations  Home health PT;Supervision - Intermittent     Equipment Recommendations       Recommendations for Other Services       Precautions / Restrictions Precautions Precautions: Fall    Mobility  Bed Mobility               General bed mobility comments: in recliner before and after  Transfers Overall transfer level: Needs assistance Equipment used: Rolling walker (2 wheeled) Transfers: Sit to/from Stand Sit to Stand: Supervision            Ambulation/Gait Ambulation/Gait assistance: Min guard Gait Distance (Feet): 50 Feet Assistive device: Rolling walker (2 wheeled) Gait Pattern/deviations: Step-through pattern;Decreased step length - right;Decreased step length - left Gait velocity: decreased   General Gait Details: to door and back x 2 x 1 in room generally staedy but fatigue noted   Stairs             Wheelchair Mobility    Modified Rankin (Stroke Patients Only)       Balance Overall balance assessment: Needs assistance Sitting-balance support: Feet supported Sitting balance-Leahy Scale: Good     Standing balance support: Bilateral upper extremity supported Standing  balance-Leahy Scale: Good                              Cognition Arousal/Alertness: Awake/alert Behavior During Therapy: WFL for tasks assessed/performed Overall Cognitive Status: Within Functional Limits for tasks assessed                                        Exercises Other Exercises Other Exercises: o commode to void and small BM    General Comments        Pertinent Vitals/Pain Pain Assessment: Faces Faces Pain Scale: Hurts little more Pain Location: back, L toe Pain Descriptors / Indicators: Aching;Sore Pain Intervention(s): Limited activity within patient's tolerance;Monitored during session    Home Living                      Prior Function            PT Goals (current goals can now be found in the care plan section) Progress towards PT goals: Progressing toward goals    Frequency    Min 2X/week      PT Plan Current plan remains appropriate    Co-evaluation              AM-PAC PT "6  Clicks" Mobility   Outcome Measure  Help needed turning from your back to your side while in a flat bed without using bedrails?: A Little Help needed moving from lying on your back to sitting on the side of a flat bed without using bedrails?: A Little Help needed moving to and from a bed to a chair (including a wheelchair)?: A Little Help needed standing up from a chair using your arms (e.g., wheelchair or bedside chair)?: A Little Help needed to walk in hospital room?: A Little Help needed climbing 3-5 steps with a railing? : A Little 6 Click Score: 18    End of Session   Activity Tolerance: Patient tolerated treatment well Patient left: in chair;with call bell/phone within reach Nurse Communication: Mobility status;Other (comment)       Time: 0488-8916 PT Time Calculation (min) (ACUTE ONLY): 23 min  Charges:  $Gait Training: 23-37 mins                    Chesley Noon, PTA 09/09/19, 1:51 PM

## 2019-09-09 NOTE — Progress Notes (Signed)
PROGRESS NOTE    Aaron Ball  NFA:[id number] DOB: 13-Oct-1950 DOA: 09/03/2019 PCP: System, Pcp Not In    Brief Narrative:  Aaron Ball a 68 y.o.malewithhistory of multiple comorbidities as outlined below, including coronary artery disease status post multiple stents, persistent A. fib status post ablation x2, type 2 diabetes, morbid obesity status post sleeve gastrectomy, OHS/OSA on CPAP, COPD.He presented to the ED today with persistent palpitations and heart racing.Found with atypical flutter on cardizem gtt. Covid negative.   Consultants:  Cardiology  Procedures:Echo  Antimicrobials:  None  Subjective: Patient is status post TEE cardioversion.  He reports feeling much better and calmer after cardioversion.  Denies any shortness of breath or chest pain.  Objective: Vitals:   09/09/19 0820 09/09/19 0830 09/09/19 0845 09/09/19 0950  BP: 113/72 110/68 107/66 102/72  Pulse: 71 69 73 72  Resp: 19 19 20  (!) 24  Temp:    98.6 F (37 C)  TempSrc:    Oral  SpO2: 98% 99% 97% 95%  Weight:      Height:        Intake/Output Summary (Last 24 hours) at 09/09/2019 1518 Last data filed at 09/09/2019 1300 Gross per 24 hour  Intake 390 ml  Output 750 ml  Net -360 ml   Filed Weights   09/08/19 0512 09/09/19 0454 09/09/19 0738  Weight: (!) 142.4 kg (!) 143.3 kg (!) 143.3 kg    Examination:  General exam: Appears calm and comfortable , nad , not anxious Respiratory system: Clear to auscultation. Respiratory effort normal. Cardiovascular system: S1 & S2 heard, RRR. No JVD, murmurs, rubs, gallops or clicks. Gastrointestinal system: Abdomen is nondistended, soft and nontender. Normal bowel sounds heard. Central nervous system: Alert and orientedx 3 . No focal neurological deficits. Extremities: +edema b/l  Psychiatry: Judgement and insight appear normal. Mood & affect appropriate.     Data Reviewed: I have personally reviewed following labs and  imaging studies  CBC: Recent Labs  Lab 09/03/19 0955 09/04/19 0511 09/07/19 0602 09/08/19 0601  WBC 6.6 9.4 10.2 10.3  HGB 13.2 12.7* 11.9* 11.8*  HCT 42.2 37.9* 36.8* 37.7*  MCV 96.3 89.6 92.7 96.4  PLT 249 268 235 469   Basic Metabolic Panel: Recent Labs  Lab 09/03/19 0955 09/04/19 0511 09/05/19 0444 09/06/19 0256 09/08/19 0601  NA 138 136 138 137 136  K 4.4 4.4 4.4 4.5 4.2  CL 100 100 101 101 101  CO2 25 24 28 25 23   GLUCOSE 142* 170* 165* 149* 158*  BUN 26* 20 22 26* 21  CREATININE 1.37* 1.23 1.45* 1.38* 1.13  CALCIUM 9.1 8.9 8.8* 8.6* 8.6*  MG  --  1.9  --   --   --    GFR: Estimated Creatinine Clearance: 83.4 mL/min (by C-G formula based on SCr of 1.13 mg/dL). Liver Function Tests: No results for input(s): AST, ALT, ALKPHOS, BILITOT, PROT, ALBUMIN in the last 168 hours. No results for input(s): LIPASE, AMYLASE in the last 168 hours. No results for input(s): AMMONIA in the last 168 hours. Coagulation Profile: Recent Labs  Lab 09/03/19 1925 09/09/19 0656  INR 1.0 1.4*   Cardiac Enzymes: No results for input(s): CKTOTAL, CKMB, CKMBINDEX, TROPONINI in the last 168 hours. BNP (last 3 results) No results for input(s): PROBNP in the last 8760 hours. HbA1C: No results for input(s): HGBA1C in the last 72 hours. CBG: Recent Labs  Lab 09/03/19 2032  GLUCAP 152*   Lipid Profile: No results for input(s): CHOL,  HDL, LDLCALC, TRIG, CHOLHDL, LDLDIRECT in the last 72 hours. Thyroid Function Tests: No results for input(s): TSH, T4TOTAL, FREET4, T3FREE, THYROIDAB in the last 72 hours. Anemia Panel: No results for input(s): VITAMINB12, FOLATE, FERRITIN, TIBC, IRON, RETICCTPCT in the last 72 hours. Sepsis Labs: No results for input(s): PROCALCITON, LATICACIDVEN in the last 168 hours.  Recent Results (from the past 240 hour(s))  SARS CORONAVIRUS 2 (TAT 6-24 HRS) Nasopharyngeal Nasopharyngeal Swab     Status: None   Collection Time: 09/03/19 11:05 AM   Specimen:  Nasopharyngeal Swab  Result Value Ref Range Status   SARS Coronavirus 2 NEGATIVE NEGATIVE Final    Comment: (NOTE) SARS-CoV-2 target nucleic acids are NOT DETECTED. The SARS-CoV-2 RNA is generally detectable in upper and lower respiratory specimens during the acute phase of infection. Negative results do not preclude SARS-CoV-2 infection, do not rule out co-infections with other pathogens, and should not be used as the sole basis for treatment or other patient management decisions. Negative results must be combined with clinical observations, patient history, and epidemiological information. The expected result is Negative. Fact Sheet for Patients: HairSlick.nohttps://www.fda.gov/media/138098/download Fact Sheet for Healthcare Providers: quierodirigir.comhttps://www.fda.gov/media/138095/download This test is not yet approved or cleared by the [place] FDA and  has been authorized for detection and/or diagnosis of SARS-CoV-2 by FDA under an Emergency Use Authorization (EUA). This EUA will remain  in effect (meaning this test can be used) for the duration of the COVID-19 declaration under Section 56 4(b)(1) of the Act, 21 U.S.C. section 360bbb-3(b)(1), unless the authorization is terminated or revoked sooner. Performed at [facility], [address] [place], [place] [address]          Radiology Studies: No results found.      Scheduled Meds: . allopurinol  300 mg Oral Daily  . ALPRAZolam  0.25 mg Oral Daily  . apixaban  5 mg Oral BID  . atorvastatin  40 mg Oral Daily  . Chlorhexidine Gluconate Cloth  6 each Topical Daily  . gabapentin  300 mg Oral QHS  . lidocaine      . loratadine  10 mg Oral QHS  . metoprolol succinate  25 mg Oral BID  . pneumococcal 23 valent vaccine  0.5 mL Intramuscular Tomorrow-1000  . sodium chloride flush      . traZODone  50 mg Oral QHS   Continuous Infusions:  Assessment & Plan:   Principal Problem:   Atrial flutter (HCC) Active Problems:   CAD  (coronary artery disease)   Hyperlipidemia   HTN (hypertension)   AKI (acute kidney injury) (HCC)   Atrial tachycardia (HCC)   OSA (obstructive sleep apnea)   COPD (chronic obstructive pulmonary disease) (HCC)   Chronic back pain   Gout   Type 2 diabetes mellitus (HCC)   Allergic rhinitis   Pulmonary edema   Morbid obesity (HCC)   Atypical atrial flutter with RVR-heart rate still notwellcontrolled --Cardiology following Status post TEE cardioversion-clinically and symptomatically he has improved BP is on the low side will decrease metoprolol to 25 mg p.o. twice daily as he has not received Cardizem or metoprolol today DC Cardizem for now Continue with digoxin Will need long-term anticoagulant  CAD (coronary artery disease)-stable --Continue aspirin and Plavix, sublingual nitro as needed  HTN (hypertension) -Continue with Cardizem, beta-blocker  Acute DHF/pulmonary edema- DC'd Lasix Echo50 to 55% Euvolemic continue to monitor   AKI-mildly elevated 2/2 diuresis D/c'd  lasix.  Improved  Type 2 diabetes mellitus (HCC) Last A1c in  epic was 6.9 back in 2017. Currently does not appear to be on any medications for this. --monitor fs, riss  Obesity Status post sleeve gastrectomy --Counseled on diet and lifestyle for weight loss  Hyperlipidemia -Continue home Lipitor  Chronic back pain -Continue home tramadol  Gout-not acutely flared -Continue home allopurinol   OSA-not using CPAP here but did counsel him about making sure he uses it at home Allergic rhinitis -Continue home loratadine  OSA (obstructive sleep apnea)/ OHS (obesity hypoventilation syndrome) --on CPAP, ordered here, but does not like the machine so refused it  COPD (chronic obstructive pulmonary disease) (HCC)- Stable without exacerbation  Lower extremity edema -Continue home Lasix as prn  hx/o paf-x2 ablation in the past  Anxiety-on home xanax prn    DVT  prophylaxis:Eliquis Code Status:Full Family Communication:None at bedside  disposition Plan:possible d/c in am , need to monitor hr/bp. Will need home PT/OT. F/u with primary cardiologist at [facility] in one week  LOS: 5 days   Time spent: 45 minutes with more than 50% on COC    Lynn Ito, MD [facility] Pager [phone]  If 7PM-7AM, please contact night-coverage [email or web address] Password [facility] 09/09/2019, 3:18 PM

## 2019-09-09 NOTE — Plan of Care (Signed)
  Problem: Education: Goal: Knowledge of General Education information will improve Description: Including pain rating scale, medication(s)/side effects and non-pharmacologic comfort measures Outcome: Progressing   Problem: Health Behavior/Discharge Planning: Goal: Ability to manage health-related needs will improve Outcome: Progressing   Problem: Clinical Measurements: Goal: Respiratory complications will improve Outcome: Progressing Goal: Cardiovascular complication will be avoided Outcome: Progressing   Problem: Safety: Goal: Ability to remain free from injury will improve Outcome: Progressing   Problem: Education: Goal: Knowledge of disease or condition will improve Outcome: Progressing   Problem: Activity: Goal: Risk for activity intolerance will decrease Outcome: Not Progressing

## 2019-09-09 NOTE — Anesthesia Postprocedure Evaluation (Signed)
Anesthesia Post Note  Patient: Aaron Ball  Procedure(s) Performed: CARDIOVERSION (N/A )  Patient location during evaluation: Other Anesthesia Type: General Level of consciousness: awake Pain management: pain level controlled Vital Signs Assessment: post-procedure vital signs reviewed and stable Respiratory status: spontaneous breathing Cardiovascular status: stable     Last Vitals:  Vitals:   09/09/19 0845 09/09/19 0950  BP: 107/66 102/72  Pulse: 73 72  Resp: 20 (!) 24  Temp:  37 C  SpO2: 97% 95%    Last Pain:  Vitals:   09/09/19 0950  TempSrc: Oral  PainSc:                  Lyla Jasek,Diontay K

## 2019-09-09 NOTE — CV Procedure (Signed)
    Cardioversion Note  Tevin Shillingford [id number] 05-14-1951  Procedure: DC Cardioversion Indications: Atrial flutter  Procedure Details Consent: Obtained Time Out: Verified patient identification, verified procedure, site/side was marked, verified correct patient position, special equipment/implants available, Radiology Safety Procedures followed,  medications/allergies/relevent history reviewed, required imaging and test results available.  Performed  The patient has been on adequate anticoagulation.  The patient received IV propofol by anesthesia for sedation.  Synchronous cardioversion was performed at 200 joules x 2 with restoration of sinus rhythm after the second shock.  The cardioversion was successful.  Complications: No apparent complications Patient did tolerate procedure well.  Nelva Bush., MD 09/09/2019, 8:17 AM

## 2019-09-09 NOTE — Care Management Important Message (Signed)
Important Message  Patient Details  Name: Aaron Ball MRN: [id number] Date of Birth: 08/13/1951   Medicare Important Message Given:  Yes     Dannette Barbara 09/09/2019, 10:59 AM

## 2019-09-09 NOTE — Progress Notes (Signed)
Progress Note  Patient Name: Aaron Ball Date of Encounter: 09/09/2019  Primary Cardiologist: [facility]  Subjective   Patient reports palpitations this morning before cardioversion.  No CP or shortness of breath.  He is now s/p TEE/DCCV with restoration of sinus rhythm.  Inpatient Medications    Scheduled Meds: . [MAR Hold] allopurinol  300 mg Oral Daily  . [MAR Hold] ALPRAZolam  0.25 mg Oral Daily  . [MAR Hold] apixaban  5 mg Oral BID  . [MAR Hold] atorvastatin  40 mg Oral Daily  . butamben-tetracaine-benzocaine      . [MAR Hold] Chlorhexidine Gluconate Cloth  6 each Topical Daily  . [MAR Hold] digoxin  0.125 mg Oral Daily  . [MAR Hold] diltiazem  360 mg Oral Daily  . [MAR Hold] gabapentin  300 mg Oral QHS  . influenza vaccine adjuvanted  0.5 mL Intramuscular Tomorrow-1000  . lidocaine      . [MAR Hold] loratadine  10 mg Oral QHS  . [MAR Hold] metoprolol succinate  100 mg Oral BID  . pneumococcal 23 valent vaccine  0.5 mL Intramuscular Tomorrow-1000  . sodium chloride flush      . [MAR Hold] traZODone  50 mg Oral QHS   Continuous Infusions: . sodium chloride 20 mL/hr at 09/09/19 0603   PRN Meds: [MAR Hold] acetaminophen **OR** [MAR Hold] acetaminophen, [MAR Hold] bisacodyl, [MAR Hold] magnesium citrate, [MAR Hold] metoprolol tartrate, [MAR Hold] nitroGLYCERIN, [MAR Hold] ondansetron **OR** [MAR Hold] ondansetron (ZOFRAN) IV, [MAR Hold] polyethylene glycol, [MAR Hold] simethicone, [MAR Hold] tiZANidine, [MAR Hold] traMADol   Vital Signs    Vitals:   09/09/19 0815 09/09/19 0816 09/09/19 0817 09/09/19 0818  BP:  (!) 79/65    Pulse: 68 69 69 70  Resp: 20 18  16   Temp:      TempSrc:      SpO2: 92% 93% 93% 97%  Weight:      Height:        Intake/Output Summary (Last 24 hours) at 09/09/2019 0819 Last data filed at 09/09/2019 0819 Gross per 24 hour  Intake 150 ml  Output 250 ml  Net -100 ml   Last 3 Weights 09/09/2019 09/09/2019 09/08/2019  Weight  (lbs) 315 lb 14.7 oz 315 lb 14.4 oz 313 lb 15.2 oz  Weight (kg) 143.3 kg 143.291 kg 142.407 kg      Telemetry    Atrial flutter with variable block. - Personally Reviewed  ECG    NSR with nonspecific ST changes - Personally Reviewed  Physical Exam   GEN: No acute distress.   Neck: No JVD Cardiac: RRR, no murmurs, rubs, or gallops.  Respiratory: Coarse breath sounds bilaterally. GI: Soft, nontender, non-distended  MS: No edema; No deformity. Neuro:  Nonfocal  Psych: Normal affect   Labs    High Sensitivity Troponin:   Recent Labs  Lab 09/03/19 0955 09/03/19 1218  TROPONINIHS 6 4      Chemistry Recent Labs  Lab 09/05/19 0444 09/06/19 0256 09/08/19 0601  NA 138 137 136  K 4.4 4.5 4.2  CL 101 101 101  CO2 28 25 23   GLUCOSE 165* 149* 158*  BUN 22 26* 21  CREATININE 1.45* 1.38* 1.13  CALCIUM 8.8* 8.6* 8.6*  GFRNONAA 49* 52* >60  GFRAA 57* >60 >60  ANIONGAP 9 11 12      Hematology Recent Labs  Lab 09/04/19 0511 09/07/19 0602 09/08/19 0601  WBC 9.4 10.2 10.3  RBC 4.23 3.97* 3.91*  HGB 12.7* 11.9*  11.8*  HCT 37.9* 36.8* 37.7*  MCV 89.6 92.7 96.4  MCH 30.0 30.0 30.2  MCHC 33.5 32.3 31.3  RDW 14.3 14.6 14.6  PLT 268 235 231    BNP Recent Labs  Lab 09/03/19 0955  BNP 125.0*     DDimer No results for input(s): DDIMER in the last 168 hours.   Radiology    No results found.  Cardiac Studies   TTE (09/04/2019):  1. Left ventricular ejection fraction, by visual estimation, is 55 to 60%. The left ventricle has normal function. Unable to exclude hypertrophy or wall motion abnormality.  2. Left ventricular diastolic parameters are indeterminate.  3. Global right ventricle was not well visualized.  4. Left atrial size was not well visualized.  5. The mitral valve was not well visualized.  6. The tricuspid valve is not well visualized.  7. The aortic valve was not well visualized.  8. The pulmonic valve was not well visualized.  9. TR signal is  inadequate for assessing pulmonary artery systolic pressure.  Patient Profile     68 y.o. male with history of CAD status post PCI to the proximal RCA in 1997 complicated by ISR in 2003 and 2014 status post PCI/DES to the mid RCA in 05/2013, persistent A. fib status post prior ablation x2 in 2010 and 2012, HFpEF, paroxysmal SVT, DM2, COPD quitting tobacco use in 2005, morbid obesity status post sleeve gastrectomy, OHS/OSA on CPAP, hyperparathyroidism, and GERD, admitted with atrial flutter and difficult to control ventricular rates.  Assessment & Plan    Atrial flutter: HR still suboptimally controlled much of yesterday in spite of maximum doses of metoprolol and diltiazem, as well as digoxin.  He is now s/p TEE/DCCV with restoration of sinus rhythm.  Continue apixaban 5 mg BID.  Continue metoprolol succinate 100 mg BID and diltiazem 360 mg daily; may be able to decrease/wean off diltiazem as HR and blood pressure declare themselves post-cardioversion.  Acute HFpEF: Patient diuresed earlier in the admission.  He appears grossly euvolemic at this time.  Maintain net even fluid balance.  Coronary artery disease: No sx to suggest worsening coronary insufficiency.  Continue secondary prevention with atorvastatin and metoprolol.  Patient is on anticoagulation with apixaban in lieu of aspirin at this time.  Obstructive sleep apnea:  CPAP use encouraged.  If patient maintains sinus rhythm and has reasonable HR/BP, discharge could be considered later this afternoon from a heart standpoint.  He will need to follow-up with his cardiologist at [facility] in ~1-2 weeks.  For questions or updates, please contact [facility] Please consult [email or web address] for contact info under [facility].    Signed, Yvonne Kendall, MD  09/09/2019, 8:19 AM

## 2019-09-09 NOTE — Anesthesia Preprocedure Evaluation (Addendum)
Anesthesia Evaluation  Patient identified by MRN, date of birth, ID band Patient awake    Reviewed: Allergy & Precautions, NPO status , Patient's Chart, lab work & pertinent test results  History of Anesthesia Complications Negative for: history of anesthetic complications  Airway Mallampati: III       Dental  (+) Poor Dentition, Edentulous Upper, Missing, Chipped   Pulmonary sleep apnea and Continuous Positive Airway Pressure Ventilation , COPD,  COPD inhaler, former smoker,           Cardiovascular hypertension, Pt. on medications + CAD and +CHF  (-) Past MI + dysrhythmias Atrial Fibrillation + Valvular Problems/Murmurs      Neuro/Psych neg Seizures    GI/Hepatic Neg liver ROS, GERD  Medicated,  Endo/Other  diabetes, Type 2, Oral Hypoglycemic AgentsMorbid obesity  Renal/GU negative Renal ROS     Musculoskeletal   Abdominal   Peds  Hematology   Anesthesia Other Findings   Reproductive/Obstetrics                             Anesthesia Physical Anesthesia Plan  ASA: III and emergent  Anesthesia Plan: General   Post-op Pain Management:    Induction: Intravenous  PONV Risk Score and Plan: 2 and Propofol infusion and TIVA  Airway Management Planned: Nasal Cannula  Additional Equipment:   Intra-op Plan:   Post-operative Plan:   Informed Consent: I have reviewed the patients History and Physical, chart, labs and discussed the procedure including the risks, benefits and alternatives for the proposed anesthesia with the patient or authorized representative who has indicated his/her understanding and acceptance.       Plan Discussed with:   Anesthesia Plan Comments:         Anesthesia Quick Evaluation

## 2019-09-09 NOTE — CV Procedure (Signed)
    Transesophageal Echocardiogram Note  Aaron Ball [id number] 1951-03-31  Procedure: Transesophageal Echocardiogram Indications: Atrial flutter  Procedure Details Consent: Obtained Time Out: Verified patient identification, verified procedure, site/side was marked, verified correct patient position, special equipment/implants available, Radiology Safety Procedures followed,  medications/allergies/relevent history reviewed, required imaging and test results available.  Performed  Medications:  Sedation was administered by anesthesia.  Left Ventrical:  Low normal LVEF.  Mitral Valve: Normal with trace MR.  Aortic Valve: Tricuspid.  No AI.  Tricuspid Valve: Normal with mild TR.  Pulmonic Valve: Normal with trivial PR.  Left Atrium/ Left atrial appendage: Multilobed LAA.  No LAA/LA thrombus.  Atrial septum: Intact by color Doppler.  Aorta: Mild plaquing in the descending aorta.  Complications: No apparent complications Patient did tolerate procedure well.  Nelva Bush, MD 09/09/2019, 8:15 AM

## 2019-09-09 NOTE — Transfer of Care (Signed)
Immediate Anesthesia Transfer of Care Note  Patient: Aaron Ball  Procedure(s) Performed: CARDIOVERSION (N/A )  Patient Location: PACU  Anesthesia Type:General  Level of Consciousness: drowsy and patient cooperative  Airway & Oxygen Therapy: Patient Spontanous Breathing and Patient connected to nasal cannula oxygen  Post-op Assessment: Report given to RN and Post -op Vital signs reviewed and stable  Post vital signs: Reviewed and stable  Last Vitals:  Vitals Value Taken Time  BP 98/71 09/09/19 0819  Temp    Pulse 71 09/09/19 0819  Resp 19 09/09/19 0819  SpO2 98 % 09/09/19 0819  Vitals shown include unvalidated device data.  Last Pain:  Vitals:   09/09/19 0738  TempSrc: Oral  PainSc: 0-No pain      Patients Stated Pain Goal: 0 (17/51/02 5852)  Complications: No apparent anesthesia complications

## 2019-09-09 NOTE — Interval H&P Note (Signed)
History and Physical Interval Note:  09/09/2019 7:51 AM  Aaron Ball  has presented today for surgery, with the diagnosis of atrial flutter.  The various methods of treatment have been discussed with the patient and family. After consideration of risks, benefits and other options for treatment, the patient has consented to  Procedure(s): TRANSESOPHAGEAL ECHOCARDIOGRAM (TEE) (N/A) as a surgical intervention.  The patient's history has been reviewed, patient examined, no change in status, stable for surgery.  I have reviewed the patient's chart and labs.  Questions were answered to the patient's satisfaction.     Takesha Steger

## 2019-09-09 NOTE — Progress Notes (Signed)
*  PRELIMINARY RESULTS* Echocardiogram Echocardiogram Transesophageal has been performed.  Sherrie Sport 09/09/2019, 8:26 AM

## 2019-09-10 DIAGNOSIS — I5033 Acute on chronic diastolic (congestive) heart failure: Secondary | ICD-10-CM

## 2019-09-10 LAB — BASIC METABOLIC PANEL
Anion gap: 10 (ref 5–15)
Anion gap: 10 (ref 5–15)
BUN: 31 mg/dL — ABNORMAL HIGH (ref 8–23)
BUN: 34 mg/dL — ABNORMAL HIGH (ref 8–23)
CO2: 24 mmol/L (ref 22–32)
CO2: 24 mmol/L (ref 22–32)
Calcium: 8.3 mg/dL — ABNORMAL LOW (ref 8.9–10.3)
Calcium: 8.4 mg/dL — ABNORMAL LOW (ref 8.9–10.3)
Chloride: 102 mmol/L (ref 98–111)
Chloride: 99 mmol/L (ref 98–111)
Creatinine, Ser: 2.32 mg/dL — ABNORMAL HIGH (ref 0.61–1.24)
Creatinine, Ser: 2.52 mg/dL — ABNORMAL HIGH (ref 0.61–1.24)
GFR calc Af Amer: 32 mL/min — ABNORMAL LOW (ref >60)
GFR calc Af Amer: 29 mL/min — ABNORMAL LOW (ref >60)
GFR calc non Af Amer: 28 mL/min — ABNORMAL LOW (ref >60)
GFR calc non Af Amer: 25 mL/min — ABNORMAL LOW (ref >60)
Glucose, Bld: 141 mg/dL — ABNORMAL HIGH (ref 70–99)
Glucose, Bld: 167 mg/dL — ABNORMAL HIGH (ref 70–99)
Potassium: 4.7 mmol/L (ref 3.5–5.1)
Potassium: 5 mmol/L (ref 3.5–5.1)
Sodium: 136 mmol/L (ref 135–145)
Sodium: 133 mmol/L — ABNORMAL LOW (ref 135–145)

## 2019-09-10 LAB — CBC
HCT: 34.3 % — ABNORMAL LOW (ref 39.0–52.0)
Hemoglobin: 10.5 g/dL — ABNORMAL LOW (ref 13.0–17.0)
MCH: 30.2 pg (ref 26.0–34.0)
MCHC: 30.6 g/dL (ref 30.0–36.0)
MCV: 98.6 fL (ref 80.0–100.0)
Platelets: 199 10*3/uL (ref 150–400)
RBC: 3.48 MIL/uL — ABNORMAL LOW (ref 4.22–5.81)
RDW: 14.7 % (ref 11.5–15.5)
WBC: 12.8 10*3/uL — ABNORMAL HIGH (ref 4.0–10.5)
nRBC: 0 % (ref 0.0–0.2)

## 2019-09-10 MED ORDER — LACTATED RINGERS IV SOLN: INTRAVENOUS | Status: DC

## 2019-09-10 MED ORDER — COLCHICINE 0.6 MG PO TABS
0.6000 mg | ORAL_TABLET | Freq: Every day | ORAL | Status: DC
  Administered 2019-09-10 – 2019-09-12 (×3): 0.6 mg via ORAL
  Filled 2019-09-10 (×3): qty 1

## 2019-09-10 MED ORDER — ALLOPURINOL 100 MG PO TABS
100.0000 mg | ORAL_TABLET | Freq: Every day | ORAL | Status: DC
  Administered 2019-09-11 – 2019-09-12 (×2): 100 mg via ORAL
  Filled 2019-09-10 (×2): qty 1

## 2019-09-10 MED ORDER — METOPROLOL SUCCINATE ER 25 MG PO TB24
12.5000 mg | ORAL_TABLET | Freq: Two times a day (BID) | ORAL | Status: DC
  Administered 2019-09-10: 12.5 mg via ORAL
  Filled 2019-09-10: qty 1

## 2019-09-10 NOTE — Progress Notes (Signed)
Occupational Therapy Treatment Patient Details Name: Aaron Ball MRN: [id number] DOB: 1950/10/05 Today's Date: 09/10/2019    History of present illness Patient is a 68 year old male admitted with atrial tachycardia. PMH includes: obesity, CAD, HLD, HTN, COPD, DM, chronic back pain. S/p cardioversion 12/28   OT comments  Pt seen for OT treatment on this date. Pt received awake/alert, seated upright in room recliner, on room air, and agreeable to OT tx session. Pt initially agreeable to attempting standing grooming tasks at sink, however upon OT return to room after gathering grooming supplies, pt reporting intense sharp pain in his "lower groin area". RN notified immediately and in room to assess during session. Pt agreeable to attempting transfer to [facility] to determine if urination will help alleviate pain in his groin. Pt transfers to [facility] using RW with supervision/SBA for mgt of lines and leads. Pt uses BSC to void and requests time to attempt BM, however, pt does not have BM during session. Pt completes peri-care/hygine with supervision/SBA and then transfers back to bed with assist from this therapist as described below. Pt educated on safety, falls prevention, and energy conservation t/o functional activity and return demonstrates with min cueing for technique. Pt making good progress toward goals and continues to benefit from skilled OT services to maximize return to PLOF and minimize risk of future falls, injury, caregiver burden, and readmission. Will continue to follow POC as written. Discharge recommendation remains appropriate.    Follow Up Recommendations  Home health OT;Supervision - Intermittent    Equipment Recommendations  Tub/shower seat    Recommendations for Other Services      Precautions / Restrictions Precautions Precautions: Fall Restrictions Weight Bearing Restrictions: No       Mobility Bed Mobility Overal bed mobility: Needs Assistance Bed Mobility: Sit to  Supine       Sit to supine: Min assist   General bed mobility comments: Min assist for mgt of BLE into bed while using adapted log-roll technique this date.  Transfers Overall transfer level: Needs assistance Equipment used: Rolling walker (2 wheeled) Transfers: Sit to/from Stand Sit to Stand: Supervision         General transfer comment: Extra effort to stand but good stability upon standing with BUE support on RW.    Balance Overall balance assessment: Needs assistance Sitting-balance support: Feet supported Sitting balance-Leahy Scale: Good Sitting balance - Comments: Steady static and dynamic sitting with no LOB noted with reach in/outside BOS.   Standing balance support: Bilateral upper extremity supported;During functional activity Standing balance-Leahy Scale: Good Standing balance comment: Steady static standing.                           ADL either performed or assessed with clinical judgement   ADL Overall ADL's : Needs assistance/impaired                         Toilet Transfer: Supervision/safety;BSC;Ambulation Statistician Details (indicate cue type and reason): Pt completes t/f to [facility] with supervision + SBA for mgt of lines/leads this date. Pt completes functional transfer with mild increased effort. Cueing t/o for use of PLB as energy conservation strategy. Toileting- Clothing Manipulation and Hygiene: Set up;Supervision/safety;Sit to/from stand Toileting - Clothing Manipulation Details (indicate cue type and reason): Pt completes peri-care sit/to stand with supervision/set-up and SBA for mgt of lines/leads this date.     Functional mobility during ADLs: Rolling walker;Supervision/safety;Min guard  General ADL Comments: Supervision to min guard during functional mobility/transfers. Pt requires occasional cueing for safe hand/foot placement. Consistent cueing for use of ECS t/o activity.     Vision Baseline Vision/History: Wears  glasses Wears Glasses: At all times Patient Visual Report: No change from baseline     Perception     Praxis      Cognition Arousal/Alertness: Awake/alert Behavior During Therapy: WFL for tasks assessed/performed Overall Cognitive Status: Within Functional Limits for tasks assessed                                          Exercises Exercises: General Lower Extremity General Exercises - Lower Extremity Long Arc Quad: Strengthening;10 reps Heel Slides: Strengthening;10 reps Hip ABduction/ADduction: Strengthening;10 reps Hip Flexion/Marching: Strengthening;10 reps Other Exercises Other Exercises: OT assists pt to commode to void. Pt requires supervision/set-up t/o activity. Consistent cueing t/o for implementation of energy conservation strategies. Other Exercises: Pt eduated in pursed lip breathing as an energy conservation strategy, falls prevention strategy, and safe use of AE for functional mobility this date.   Shoulder Instructions       General Comments Pt spO2 monitored t/o session. Remained WFLs t/o with pt on RA.    Pertinent Vitals/ Pain       Pain Assessment: Faces Faces Pain Scale: Hurts whole lot Pain Location: pt complains of singificant pain in hs lower abdomen/groin. this resolves after urination on bsc Pain Descriptors / Indicators: Grimacing;Guarding;Sharp Pain Intervention(s): Monitored during session;Repositioned  Home Living                                          Prior Functioning/Environment              Frequency  Min 1X/week        Progress Toward Goals  OT Goals(current goals can now be found in the care plan section)  Progress towards OT goals: Progressing toward goals  Acute Rehab OT Goals Patient Stated Goal: To get out of the hospital soon OT Goal Formulation: With patient Time For Goal Achievement: 09/18/19 Potential to Achieve Goals: Good  Plan Discharge plan remains  appropriate;Frequency remains appropriate    Co-evaluation                 AM-PAC OT "6 Clicks" Daily Activity     Outcome Measure   Help from another person eating meals?: None Help from another person taking care of personal grooming?: A Little Help from another person toileting, which includes using toliet, bedpan, or urinal?: A Little Help from another person bathing (including washing, rinsing, drying)?: A Little Help from another person to put on and taking off regular upper body clothing?: None Help from another person to put on and taking off regular lower body clothing?: A Little 6 Click Score: 20    End of Session Equipment Utilized During Treatment: Gait belt;Rolling walker  OT Visit Diagnosis: Unsteadiness on feet (R26.81)   Activity Tolerance Patient tolerated treatment well   Patient Left in bed;with call bell/phone within reach;with bed alarm set   Nurse Communication Mobility status;Other (comment)(Pt used BSC to void. Reporting abdominal pain has gone away.)        Time: 1451-1532 OT Time Calculation (min): 41 min  Charges: OT General Charges $OT Visit: 1  Visit OT Treatments $Self Care/Home Management : 38-52 mins  Shara Blazing, M.S., OTR/L Ascom: [phone] 09/10/19, 3:47 PM

## 2019-09-10 NOTE — Progress Notes (Signed)
PROGRESS NOTE    Aaron Ball  FAO:[id number] DOB: 1951-01-22 DOA: 09/03/2019 PCP: System, Pcp Not In    Brief Narrative:  Aaron Ball a 68 y.o.malewithhistory of multiple comorbidities as outlined below, including coronary artery disease status post multiple stents, persistent A. fib status post ablation x2, type 2 diabetes, morbid obesity status post sleeve gastrectomy, OHS/OSA on CPAP, COPD.He presented to the ED today with persistent palpitations and heart racing.Found with atypical flutter on cardizem gtt. Covid negative.   Consultants:  Cardiology  Procedures:Echo  Antimicrobials:  None  Subjective: Patient seen and examined earlier this morning eeling better today.  Denies shortness of breath or chest pain. Objective: Vitals:   09/09/19 1945 09/10/19 0602 09/10/19 0835 09/10/19 1002  BP: (!) 133/59 (!) 117/59 98/62 (!) 93/57  Pulse: (!) 103 75 70 73  Resp:   18   Temp: 98.2 F (36.8 C) 98.1 F (36.7 C) 97.7 F (36.5 C)   TempSrc: Oral Oral Oral   SpO2: (!) 87% 96% 92%   Weight:  (!) 145.7 kg    Height:        Intake/Output Summary (Last 24 hours) at 09/10/2019 1656 Last data filed at 09/10/2019 1536 Gross per 24 hour  Intake 1024.23 ml  Output 250 ml  Net 774.23 ml   Filed Weights   09/09/19 0454 09/09/19 0738 09/10/19 0602  Weight: (!) 143.3 kg (!) 143.3 kg (!) 145.7 kg    Examination:  General exam: Appears calm and comfortable , nad , sitting in chair Respiratory system: Clear to auscultation. Respiratory effort normal.  No wheeze rales rhonchi's Cardiovascular system: S1 & S2 heard, RRR. No JVD, murmurs, rubs, gallops or clicks. Gastrointestinal system: Abdomen is nondistended, soft and nontender. Normal bowel sounds heard. Central nervous system: Alert and orientedx 3 . No focal neurological deficits. Extremities: +edema b/l  With chronic skin changes Psychiatry: Judgement and insight appear normal. Mood & affect  appropriate.     Data Reviewed: I have personally reviewed following labs and imaging studies  CBC: Recent Labs  Lab 09/04/19 0511 09/07/19 0602 09/08/19 0601 09/10/19 0528  WBC 9.4 10.2 10.3 12.8*  HGB 12.7* 11.9* 11.8* 10.5*  HCT 37.9* 36.8* 37.7* 34.3*  MCV 89.6 92.7 96.4 98.6  PLT 268 235 231 199   Basic Metabolic Panel: Recent Labs  Lab 09/04/19 0511 09/05/19 0444 09/06/19 0256 09/08/19 0601 09/10/19 0528 09/10/19 0909  NA 136 138 137 136 136 133*  K 4.4 4.4 4.5 4.2 4.7 5.0  CL 100 101 101 101 102 99  CO2 24 28 25 23 24 24   GLUCOSE 170* 165* 149* 158* 141* 167*  BUN 20 22 26* 21 31* 34*  CREATININE 1.23 1.45* 1.38* 1.13 2.32* 2.52*  CALCIUM 8.9 8.8* 8.6* 8.6* 8.3* 8.4*  MG 1.9  --   --   --   --   --    GFR: Estimated Creatinine Clearance: 37.8 mL/min (A) (by C-G formula based on SCr of 2.52 mg/dL (H)). Liver Function Tests: No results for input(s): AST, ALT, ALKPHOS, BILITOT, PROT, ALBUMIN in the last 168 hours. No results for input(s): LIPASE, AMYLASE in the last 168 hours. No results for input(s): AMMONIA in the last 168 hours. Coagulation Profile: Recent Labs  Lab 09/03/19 1925 09/09/19 0656  INR 1.0 1.4*   Cardiac Enzymes: No results for input(s): CKTOTAL, CKMB, CKMBINDEX, TROPONINI in the last 168 hours. BNP (last 3 results) No results for input(s): PROBNP in the last 8760 hours. HbA1C:  No results for input(s): HGBA1C in the last 72 hours. CBG: Recent Labs  Lab 09/03/19 2032 09/09/19 0740  GLUCAP 152* 164*   Lipid Profile: No results for input(s): CHOL, HDL, LDLCALC, TRIG, CHOLHDL, LDLDIRECT in the last 72 hours. Thyroid Function Tests: No results for input(s): TSH, T4TOTAL, FREET4, T3FREE, THYROIDAB in the last 72 hours. Anemia Panel: No results for input(s): VITAMINB12, FOLATE, FERRITIN, TIBC, IRON, RETICCTPCT in the last 72 hours. Sepsis Labs: No results for input(s): PROCALCITON, LATICACIDVEN in the last 168 hours.  Recent  Results (from the past 240 hour(s))  SARS CORONAVIRUS 2 (TAT 6-24 HRS) Nasopharyngeal Nasopharyngeal Swab     Status: None   Collection Time: 09/03/19 11:05 AM   Specimen: Nasopharyngeal Swab  Result Value Ref Range Status   SARS Coronavirus 2 NEGATIVE NEGATIVE Final    Comment: (NOTE) SARS-CoV-2 target nucleic acids are NOT DETECTED. The SARS-CoV-2 RNA is generally detectable in upper and lower respiratory specimens during the acute phase of infection. Negative results do not preclude SARS-CoV-2 infection, do not rule out co-infections with other pathogens, and should not be used as the sole basis for treatment or other patient management decisions. Negative results must be combined with clinical observations, patient history, and epidemiological information. The expected result is Negative. Fact Sheet for Patients: HairSlick.no Fact Sheet for Healthcare Providers: quierodirigir.com This test is not yet approved or cleared by the [place] FDA and  has been authorized for detection and/or diagnosis of SARS-CoV-2 by FDA under an Emergency Use Authorization (EUA). This EUA will remain  in effect (meaning this test can be used) for the duration of the COVID-19 declaration under Section 56 4(b)(1) of the Act, 21 U.S.C. section 360bbb-3(b)(1), unless the authorization is terminated or revoked sooner. Performed at [facility], [address], [place], [place] [address]          Radiology Studies: ECHO TEE  Result Date: 09/09/2019   TRANSESOPHOGEAL ECHO REPORT   Patient Name:   Aaron Ball Date of Exam: 09/09/2019 Medical Rec #:  [id number]          Height:       65.0 in Accession #:    [id number]         Weight:       315.9 lb Date of Birth:  08/04/1951         BSA:          2.40 m Patient Age:    68 years           BP:           113/72 mmHg Patient Gender: M                  HR:           71 bpm. Exam Location:   [facility]  Procedure: Transesophageal Echo, Cardiac Doppler and Color Doppler Indications:     Atrial Flutter 427.32  History:         Patient has prior history of Echocardiogram examinations, most                  recent 09/04/2019.  Sonographer:     Cristela Blue RDCS (AE) Referring Phys:  [id number] Antonieta Iba Diagnosing Phys: Cristal Deer End MD  PROCEDURE: Patients was monitored while under deep sedation. The transesophogeal probe was passed through the esophogus of the patient. The patient developed no complications during the procedure. IMPRESSIONS  1. Left ventricular ejection fraction, by  visual estimation, is 55 to 60%. The left ventricle has normal function. There is borderline left ventricular hypertrophy.  2. The left ventricle is not well visualized.  3. Global right ventricle was not assessed.The right ventricular size is normal. Right vetricular wall thickness was not assessed.  4. Left atrial size was not assessed.  5. No left atrial or left atrial appendage thrombus was seen.  6. Right atrial size was not well visualized.  7. The pericardium was not assessed.  8. The mitral valve is normal in structure. Trivial mitral valve regurgitation.  9. The tricuspid valve is not well visualized. 10. The aortic valve is tricuspid. Aortic valve regurgitation is trivial. 11. The pulmonic valve was normal in structure. Pulmonic valve regurgitation is trivial. 12. Mild plaque invoving the descending aorta. FINDINGS  Left Ventricle: Left ventricular ejection fraction, by visual estimation, is 55 to 60%. The left ventricle has normal function. The left ventricle is not well visualized. There is borderline left ventricular hypertrophy. Right Ventricle: The right ventricular size is normal. Right vetricular wall thickness was not assessed. Global RV systolic function is was not assessed. Left Atrium: Left atrial size was not assessed. No left atrial or left atrial appendage thrombus was seen. Right Atrium: Right atrial size  was not well visualized Pericardium: The pericardium was not assessed. Mitral Valve: The mitral valve is normal in structure. Trivial mitral valve regurgitation. Tricuspid Valve: The tricuspid valve is not well visualized. Tricuspid valve regurgitation is mild. Aortic Valve: The aortic valve is tricuspid. Aortic valve regurgitation is trivial. Pulmonic Valve: The pulmonic valve was normal in structure. Pulmonic valve regurgitation is trivial. Aorta: The aortic root and ascending aorta are structurally normal, with no evidence of dilitation. There is mild plaque involving the descending aorta. Shunts: No atrial level shunt detected by color flow Doppler.  Nelva Bush MD Electronically signed by Nelva Bush MD Signature Date/Time: 09/09/2019/6:20:22 PM    Final         Scheduled Meds: . Derrill Memo ON 09/11/2019] allopurinol  100 mg Oral Daily  . ALPRAZolam  0.25 mg Oral Daily  . apixaban  5 mg Oral BID  . atorvastatin  40 mg Oral Daily  . Chlorhexidine Gluconate Cloth  6 each Topical Daily  . gabapentin  300 mg Oral QHS  . loratadine  10 mg Oral QHS  . metoprolol succinate  25 mg Oral BID  . pneumococcal 23 valent vaccine  0.5 mL Intramuscular Tomorrow-1000  . sodium chloride flush  10 mL Intravenous Q12H  . traZODone  50 mg Oral QHS   Continuous Infusions: . lactated ringers 50 mL/hr at 09/10/19 1510    Assessment & Plan:   Principal Problem:   Atrial flutter ([place]) Active Problems:   CAD (coronary artery disease)   Hyperlipidemia   HTN (hypertension)   AKI (acute kidney injury) ([place])   Atrial tachycardia (HCC)   OSA (obstructive sleep apnea)   COPD (chronic obstructive pulmonary disease) (HCC)   Chronic back pain   Gout   Type 2 diabetes mellitus (HCC)   Allergic rhinitis   Pulmonary edema   Morbid obesity (HCC)   Atypical atrial flutter with RVR-heart rate still notwellcontrolled --Cardiology following Status post TEE cardioversion-clinically and symptomatically he  has improved BP is on the low, has not received beta-blockers, will decrease Toprol to 12.5 mg twice daily with parameters  Cardizem was already discontinued Continue with digoxin Will need long-term anticoagulant   AKI-likely from hypotension. Started on gentle hydration, but will monitor  volume status Acute a.m. labs  CAD (coronary artery disease)-stable --Continue aspirin and Plavix, sublingual nitro as needed  HTN (hypertension) -Continue with Cardizem, beta-blocker  Acute DHF/pulmonary edema- DC'd Lasix Echo50 to 55% Euvolemic continue to monitor   AKI-mildly elevated 2/2 diuresis D/c'd  lasix.  Improved  Type 2 diabetes mellitus (HCC) Last A1c in epic was 6.9 back in 2017. Currently does not appear to be on any medications for this. --monitor fs, riss  Obesity Status post sleeve gastrectomy --Counseled on diet and lifestyle for weight loss  Hyperlipidemia -Continue home Lipitor  Chronic back pain -Continue home tramadol  Gout-not acutely flared -Continue home allopurinol   OSA-not using CPAP here but did counsel him about making sure he uses it at home Allergic rhinitis -Continue home loratadine  OSA (obstructive sleep apnea)/ OHS (obesity hypoventilation syndrome) --on CPAP, ordered here, but does not like the machine so refused it Told him needs to be complaint at home  COPD (chronic obstructive pulmonary disease) (HCC)- Stable without exacerbation  Lower extremity edema -Continue home Lasix as prn  hx/o paf-x2 ablation in the past  Anxiety-on home xanax prn    DVT prophylaxis:Eliquis Code Status:Full Family Communication:None at bedside  disposition Plan:possible d/c in am if creatinine is improving. Will need home PT/OT. [facility]  LOS: 6 days   Time spent: 45 minutes with more than 50% on COC    Lynn ItoSahar Aster Screws, MD [facility] Pager [phone]  If 7PM-7AM,  please contact night-coverage [email or web address] Password [email or web address] 09/10/2019, 4:56 PM Patient ID: Aaron Ball, male   DOB: [birth year], 68 y.o.   MRN: [id number]

## 2019-09-10 NOTE — Progress Notes (Signed)
Progress Note  Patient Name: Aaron Ball Date of Encounter: 09/10/2019  Primary Cardiologist: [facility]  Subjective   Aaron Ball feels noticeably better following yesterday's cardioversion.  Breathing has improved.  No chest pain or palpitations.  Only complaint is of pain in the left great toe consistent with previous gout.  Inpatient Medications    Scheduled Meds: . allopurinol  300 mg Oral Daily  . ALPRAZolam  0.25 mg Oral Daily  . apixaban  5 mg Oral BID  . atorvastatin  40 mg Oral Daily  . Chlorhexidine Gluconate Cloth  6 each Topical Daily  . gabapentin  300 mg Oral QHS  . loratadine  10 mg Oral QHS  . metoprolol succinate  25 mg Oral BID  . pneumococcal 23 valent vaccine  0.5 mL Intramuscular Tomorrow-1000  . sodium chloride flush  10 mL Intravenous Q12H  . traZODone  50 mg Oral QHS   Continuous Infusions:  PRN Meds: acetaminophen **OR** acetaminophen, bisacodyl, magnesium citrate, nitroGLYCERIN, ondansetron **OR** ondansetron (ZOFRAN) IV, polyethylene glycol, simethicone, tiZANidine, traMADol   Vital Signs    Vitals:   09/09/19 1637 09/09/19 1945 09/10/19 0602 09/10/19 0835  BP: 115/65 (!) 133/59 (!) 117/59 98/62  Pulse: 76 (!) 103 75 70  Resp: 19   18  Temp:  98.2 F (36.8 C) 98.1 F (36.7 C) 97.7 F (36.5 C)  TempSrc:  Oral Oral Oral  SpO2: 99% (!) 87% 96% 92%  Weight:   (!) 145.7 kg   Height:        Intake/Output Summary (Last 24 hours) at 09/10/2019 0839 Last data filed at 09/09/2019 1700 Gross per 24 hour  Intake 720 ml  Output 500 ml  Net 220 ml   Last 3 Weights 09/10/2019 09/09/2019 09/09/2019  Weight (lbs) 321 lb 3.2 oz 315 lb 14.7 oz 315 lb 14.4 oz  Weight (kg) 145.695 kg 143.3 kg 143.291 kg      Telemetry    Sinus rhythm with a single 5 beat atrial run - Personally Reviewed  ECG    No new tracing  Physical Exam   GEN: No acute distress.   Neck:  No obvious JVD, though evaluation is limited by body  habitus. Cardiac: RRR, no murmurs, rubs, or gallops.  Respiratory: Clear to auscultation bilaterally. GI: Soft, nontender, non-distended  MS:  1+ left and trace right pretibial edema; No deformity. Neuro:  Nonfocal  Psych: Normal affect   Labs    High Sensitivity Troponin:   Recent Labs  Lab 09/03/19 0955 09/03/19 1218  TROPONINIHS 6 4      Chemistry Recent Labs  Lab 09/06/19 0256 09/08/19 0601 09/10/19 0528  NA 137 136 136  K 4.5 4.2 4.7  CL 101 101 102  CO2 25 23 24   GLUCOSE 149* 158* 141*  BUN 26* 21 31*  CREATININE 1.38* 1.13 2.32*  CALCIUM 8.6* 8.6* 8.3*  GFRNONAA 52* >60 28*  GFRAA >60 >60 32*  ANIONGAP 11 12 10      Hematology Recent Labs  Lab 09/07/19 0602 09/08/19 0601 09/10/19 0528  WBC 10.2 10.3 12.8*  RBC 3.97* 3.91* 3.48*  HGB 11.9* 11.8* 10.5*  HCT 36.8* 37.7* 34.3*  MCV 92.7 96.4 98.6  MCH 30.0 30.2 30.2  MCHC 32.3 31.3 30.6  RDW 14.6 14.6 14.7  PLT 235 231 199    BNP Recent Labs  Lab 09/03/19 0955  BNP 125.0*     DDimer No results for input(s): DDIMER in the last 168 hours.  Radiology    ECHO TEE  Result Date: 09/09/2019   TRANSESOPHOGEAL ECHO REPORT   Patient Name:   Aaron Ball Date of Exam: 09/09/2019 Medical Rec #:  [id number]          Height:       65.0 in Accession #:    [id number]         Weight:       315.9 lb Date of Birth:  [birth year]         BSA:          2.40 m Patient Age:    68 years           BP:           113/72 mmHg Patient Gender: M                  HR:           71 bpm. Exam Location:  [facility]  Procedure: Transesophageal Echo, Cardiac Doppler and Color Doppler Indications:     Atrial Flutter 427.32  History:         Patient has prior history of Echocardiogram examinations, most                  recent 09/04/2019.  Sonographer:     Cristela BlueJerry Hege RDCS (AE) Referring Phys:  [id number] Antonieta IbaIMOTHY J GOLLAN Diagnosing Phys: Cristal Deerhristopher Alexcia Schools MD  PROCEDURE: Patients was monitored while under deep sedation. The transesophogeal probe  was passed through the esophogus of the patient. The patient developed no complications during the procedure. IMPRESSIONS  1. Left ventricular ejection fraction, by visual estimation, is 55 to 60%. The left ventricle has normal function. There is borderline left ventricular hypertrophy.  2. The left ventricle is not well visualized.  3. Global right ventricle was not assessed.The right ventricular size is normal. Right vetricular wall thickness was not assessed.  4. Left atrial size was not assessed.  5. No left atrial or left atrial appendage thrombus was seen.  6. Right atrial size was not well visualized.  7. The pericardium was not assessed.  8. The mitral valve is normal in structure. Trivial mitral valve regurgitation.  9. The tricuspid valve is not well visualized. 10. The aortic valve is tricuspid. Aortic valve regurgitation is trivial. 11. The pulmonic valve was normal in structure. Pulmonic valve regurgitation is trivial. 12. Mild plaque invoving the descending aorta. FINDINGS  Left Ventricle: Left ventricular ejection fraction, by visual estimation, is 55 to 60%. The left ventricle has normal function. The left ventricle is not well visualized. There is borderline left ventricular hypertrophy. Right Ventricle: The right ventricular size is normal. Right vetricular wall thickness was not assessed. Global RV systolic function is was not assessed. Left Atrium: Left atrial size was not assessed. No left atrial or left atrial appendage thrombus was seen. Right Atrium: Right atrial size was not well visualized Pericardium: The pericardium was not assessed. Mitral Valve: The mitral valve is normal in structure. Trivial mitral valve regurgitation. Tricuspid Valve: The tricuspid valve is not well visualized. Tricuspid valve regurgitation is mild. Aortic Valve: The aortic valve is tricuspid. Aortic valve regurgitation is trivial. Pulmonic Valve: The pulmonic valve was normal in structure. Pulmonic valve  regurgitation is trivial. Aorta: The aortic root and ascending aorta are structurally normal, with no evidence of dilitation. There is mild plaque involving the descending aorta. Shunts: No atrial level shunt detected by color flow Doppler.  Yvonne Kendallhristopher Treysen Sudbeck MD Electronically signed by Cristal Deerhristopher  Deriyah Kunath MD Signature Date/Time: 09/09/2019/6:20:22 PM    Final     Cardiac Studies   TEE (09/10/2019):  1. Left ventricular ejection fraction, by visual estimation, is 55 to 60%. The left ventricle has normal function. There is borderline left ventricular hypertrophy.  2. The left ventricle is not well visualized.  3. Global right ventricle was not assessed.The right ventricular size is normal. Right vetricular wall thickness was not assessed.  4. Left atrial size was not assessed.  5. No left atrial or left atrial appendage thrombus was seen.  6. Right atrial size was not well visualized.  7. The pericardium was not assessed.  8. The mitral valve is normal in structure. Trivial mitral valve regurgitation.  9. The tricuspid valve is not well visualized. 10. The aortic valve is tricuspid. Aortic valve regurgitation is trivial. 11. The pulmonic valve was normal in structure. Pulmonic valve regurgitation is trivial. 12. Mild plaque invoving the descending aorta.  TTE (09/04/2019): 1. Left ventricular ejection fraction, by visual estimation, is 55 to 60%. The left ventricle has normal function. Unable to exclude hypertrophy or wall motion abnormality. 2. Left ventricular diastolic parameters are indeterminate. 3. Global right ventricle was not well visualized. 4. Left atrial size was not well visualized. 5. The mitral valve was not well visualized. 6. The tricuspid valve is not well visualized. 7. The aortic valve was not well visualized. 8. The pulmonic valve was not well visualized. 9. TR signal is inadequate for assessing pulmonary artery systolic pressure.  Patient Profile     68 y.o.  male with history of CAD status post PCI to the proximal RCA in 1997 complicated by ISR in 2003 and 2014 status post PCI/DES to the mid RCA in 05/2013, persistent A. fib status post prior ablation x2 in 2010 and 2012, HFpEF, paroxysmal SVT, DM2, COPD quitting tobacco use in 2005, morbid obesity status post sleeve gastrectomy, OHS/OSA on CPAP, hyperparathyroidism, and GERD, admitted with atrial flutter and difficult to control ventricular rates, now status post TEE guided cardioversion.   Assessment & Plan    Atrial flutter: Aaron Ball is maintaining sinus rhythm.  Brief run of SVT noted yesterday, with telemetry otherwise showing sinus rhythm.  Metoprolol decreased and diltiazem held yesterday due to soft blood pressures.  Digoxin also discontinued after yesterday's cardioversion.  Continue metoprolol succinate 25 mg twice daily.  Continue apixaban 5 mg twice daily.  Acute on chronic HFpEF: I's and O's indicate slight positive fluid balance yesterday, though incompletely recorded.  Weight is up 6 pounds from yesterday, though I question accuracy.  Aaron Ball does appear more edematous on exam today.  This is confounded by acute kidney injury.  His breathing has actually improved since cardioversion yesterday.  Maintain net even fluid balance.  Acute kidney injury: AKI noted earlier during the admission, thought to be due to overdiuresis.  Significant bump in creatinine noted from yesterday.  Question if this is spurious versus potentially related to transient hypotension in the setting of anesthesia for TEE/DCCV.  Repeat BMP.  If creatinine remains significantly elevated or increases further, consider further work-up for other causes of AKI per primary team.  Maintain net even fluid balance.  Avoid nephrotoxic drugs.  Coronary artery disease: No symptoms to suggest acute coronary insufficiency.  Continue secondary prevention.  Patient is on apixaban in lieu of aspirin, given atrial  flutter.  Obstructive sleep apnea:  Continue to encourage CPAP use.  For questions or updates, please contact [facility] Please consult [email or web address] for  contact info under [facility].     Signed, Yvonne Kendall, MD  09/10/2019, 8:39 AM

## 2019-09-10 NOTE — Progress Notes (Signed)
Dr. Kurtis Bushman on the floor, per md hold scheduled dose of metoprolol this am and start on LR @50  ML/HR

## 2019-09-10 NOTE — Progress Notes (Signed)
Physical Therapy Treatment Patient Details Name: Aaron Ball MRN: [id number] DOB: Aug 31, 1951 Today's Date: 09/10/2019    History of Present Illness Patient is a 68 year old male admitted with atrial tachycardia. PMH includes: obesity, CAD, HLD, HTN, COPD, DM, chronic back pain. S/p cardioversion 12/28    PT Comments    Pt did well with PT session walking ~75 ft (which he reports he has rarely done more than that in recent months) with HR and O2 remaining stable and appropriate t/o the effort.  He did have considerable shortness of breath despite good vitals (O2 in the mid 90s, HR 80s after the effort). Pt with no LOBs, though he did have limp keeping weight/pressure of L great toe that also ment he needed extra use of walker during the effort.    Follow Up Recommendations  Home health PT;Supervision - Intermittent     Equipment Recommendations  None recommended by PT    Recommendations for Other Services       Precautions / Restrictions Precautions Precautions: Fall Restrictions Weight Bearing Restrictions: No    Mobility  Bed Mobility Overal bed mobility: Modified Independent             General bed mobility comments: Pt was slow to get to sitting, but using rails was able to rise to sitting  Transfers Overall transfer level: Needs assistance Equipment used: Rolling walker (2 wheeled) Transfers: Sit to/from Stand Sit to Stand: Supervision            Ambulation/Gait Ambulation/Gait assistance: Min guard Gait Distance (Feet): 75 Feet Assistive device: Rolling walker (2 wheeled)       General Gait Details: Pt's O2 remained in the high 90s, HR remained stable in the 80s despite some shortness of breath and subjective fatigue.  Pt keeping L great toe off ground altering gait but not causing any safety issues   Stairs             Wheelchair Mobility    Modified Rankin (Stroke Patients Only)       Balance Overall balance assessment: Needs  assistance Sitting-balance support: Feet supported Sitting balance-Leahy Scale: Good     Standing balance support: Bilateral upper extremity supported Standing balance-Leahy Scale: Good                              Cognition Arousal/Alertness: Awake/alert Behavior During Therapy: WFL for tasks assessed/performed Overall Cognitive Status: Within Functional Limits for tasks assessed                                        Exercises General Exercises - Lower Extremity Long Arc Quad: Strengthening;10 reps Heel Slides: Strengthening;10 reps Hip ABduction/ADduction: Strengthening;10 reps Hip Flexion/Marching: Strengthening;10 reps    General Comments        Pertinent Vitals/Pain Faces Pain Scale: Hurts little more Pain Location: back, L toe Pain Intervention(s): Limited activity within patient's tolerance    Home Living                      Prior Function            PT Goals (current goals can now be found in the care plan section) Progress towards PT goals: Progressing toward goals    Frequency    Min 2X/week      PT Plan  Current plan remains appropriate    Co-evaluation              AM-PAC PT "6 Clicks" Mobility   Outcome Measure  Help needed turning from your back to your side while in a flat bed without using bedrails?: A Little Help needed moving from lying on your back to sitting on the side of a flat bed without using bedrails?: A Little Help needed moving to and from a bed to a chair (including a wheelchair)?: A Little Help needed standing up from a chair using your arms (e.g., wheelchair or bedside chair)?: A Little Help needed to walk in hospital room?: A Little Help needed climbing 3-5 steps with a railing? : A Little 6 Click Score: 18    End of Session Equipment Utilized During Treatment: Gait belt Activity Tolerance: Patient tolerated treatment well Patient left: in chair;with call bell/phone within  reach Nurse Communication: Mobility status PT Visit Diagnosis: Muscle weakness (generalized) (M62.81);Difficulty in walking, not elsewhere classified (R26.2)     Time: 7425-9563 PT Time Calculation (min) (ACUTE ONLY): 32 min  Charges:  $Gait Training: 8-22 mins $Therapeutic Exercise: 8-22 mins                     Malachi Pro, DPT 09/10/2019, 12:25 PM

## 2019-09-11 DIAGNOSIS — N179 Acute kidney failure, unspecified: Secondary | ICD-10-CM

## 2019-09-11 DIAGNOSIS — I4892 Unspecified atrial flutter: Secondary | ICD-10-CM

## 2019-09-11 LAB — BASIC METABOLIC PANEL
Anion gap: 11 (ref 5–15)
BUN: 36 mg/dL — ABNORMAL HIGH (ref 8–23)
CO2: 24 mmol/L (ref 22–32)
Calcium: 8.4 mg/dL — ABNORMAL LOW (ref 8.9–10.3)
Chloride: 100 mmol/L (ref 98–111)
Creatinine, Ser: 1.91 mg/dL — ABNORMAL HIGH (ref 0.61–1.24)
GFR calc Af Amer: 41 mL/min — ABNORMAL LOW (ref >60)
GFR calc non Af Amer: 35 mL/min — ABNORMAL LOW (ref >60)
Glucose, Bld: 137 mg/dL — ABNORMAL HIGH (ref 70–99)
Potassium: 4.6 mmol/L (ref 3.5–5.1)
Sodium: 135 mmol/L (ref 135–145)

## 2019-09-11 LAB — CBC
HCT: 34.5 % — ABNORMAL LOW (ref 39.0–52.0)
Hemoglobin: 11 g/dL — ABNORMAL LOW (ref 13.0–17.0)
MCH: 30.1 pg (ref 26.0–34.0)
MCHC: 31.9 g/dL (ref 30.0–36.0)
MCV: 94.5 fL (ref 80.0–100.0)
Platelets: 222 10*3/uL (ref 150–400)
RBC: 3.65 MIL/uL — ABNORMAL LOW (ref 4.22–5.81)
RDW: 14.6 % (ref 11.5–15.5)
WBC: 8.2 10*3/uL (ref 4.0–10.5)
nRBC: 0 % (ref 0.0–0.2)

## 2019-09-11 LAB — BRAIN NATRIURETIC PEPTIDE: B Natriuretic Peptide: 36 pg/mL (ref 0.0–100.0)

## 2019-09-11 MED ORDER — METOPROLOL SUCCINATE ER 25 MG PO TB24
25.0000 mg | ORAL_TABLET | Freq: Two times a day (BID) | ORAL | Status: DC
  Administered 2019-09-11 (×2): 25 mg via ORAL
  Filled 2019-09-11 (×2): qty 1

## 2019-09-11 MED ORDER — FUROSEMIDE 20 MG PO TABS
20.0000 mg | ORAL_TABLET | Freq: Two times a day (BID) | ORAL | Status: DC
  Administered 2019-09-11 – 2019-09-12 (×2): 20 mg via ORAL
  Filled 2019-09-11 (×2): qty 1

## 2019-09-11 NOTE — TOC Progression Note (Addendum)
Transition of Care [facility]) - Progression Note    Patient Details  Name: Aaron Ball MRN: [id number] Date of Birth: [birth year]  [facility] CM/SW Contact  Cecil Cobbs Phone Number: 09/11/2019, 6:24 PM  Clinical Narrative:    CSW spoke to patient's daughter and they would like home health PT.  [facility] is able to accept patient, and he will need a bedside commode.   Expected Discharge Plan: [place] Barriers to Discharge: Continued Medical Work up  Expected Discharge Plan and Services Expected Discharge Plan: [place]   Discharge Planning Services: CM Consult   Living arrangements for the past 2 months: Mobile Home                 DME Arranged: N/A DME Agency: NA       HH Arranged: PT HH Agency: [place] ([place]) Date HH Agency Contacted: 09/04/19 Time [place]: 1550 Representative spoke with at [place]: [place] ([place]) Interventions    Readmission Risk Interventions No flowsheet data found.

## 2019-09-11 NOTE — Progress Notes (Signed)
PROGRESS NOTE  Aaron Ball UMP:[id number] DOB: May 26, 1951 DOA: 09/03/2019 PCP: System, Pcp Not In  A & P  Typical atrial flutter with RVR, status post TEE cardioversion, follow-up with cardiology --Toprol increased per cardiology.  Continue apixaban.  Low-dose Lasix on discharge.  Follow-up with cardiology at [facility]  Acute diastolic CHF --Appears euvolemic.  Lasix resumed.  AKI thought secondary to hypotension --Slowly improving.  No further management indicated at this point.  Coronary artery disease --Stable.  Continue aspirin, Plavix  Essential hypertension --Stable.  Continue Cardizem, beta-blocker  Diabetes mellitus type 2 --CBG stable.  Morbid obesity BMI 33.82  OSA --Counseled the CPAP at home by previous physician  DVT prophylaxis: apixaban Code Status: Full Family Communication: none Disposition Plan: Home health PT   Murray Hodgkins, MD  [facility] Direct contact: see [email or web address] (further directions at bottom of note if needed) 7PM-7AM contact night coverage as at bottom of note 09/11/2019, 7:08 PM  LOS: 7 days   Significant Hospital Events   .    Consults:  .    Procedures:  .   Significant Diagnostic Tests:  [name]    Micro Data:  .    Antimicrobials:  .   Interval History/Subjective  Feels better, breathing fine, eating fine.  No complaints.  Objective   Vitals:  Vitals:   09/11/19 1113 09/11/19 1557  BP:  (!) 109/49  Pulse:  78  Resp:  18  Temp:    SpO2: 93% 95%    Exam:  Constitutional.  Appears calm, comfortable. Respiratory.  Clear to auscultation bilaterally.  No wheezes, rales or rhonchi.  Normal respiratory effort. Cardiovascular.  Regular rate and rhythm.  No murmur, gallop.  No significant lower extremity edema Psychiatric.  Grossly normal mood and affect.  Speech fluent and appropriate.  I have personally reviewed the following:   Today's Data  . Creatinine trending down to 1.91, remainder BMP  unremarkable. . Hemoglobin stable with resolution of leukocytosis.    Scheduled Meds: . allopurinol  100 mg Oral Daily  . ALPRAZolam  0.25 mg Oral Daily  . apixaban  5 mg Oral BID  . atorvastatin  40 mg Oral Daily  . Chlorhexidine Gluconate Cloth  6 each Topical Daily  . colchicine  0.6 mg Oral Daily  . furosemide  20 mg Oral BID  . gabapentin  300 mg Oral QHS  . loratadine  10 mg Oral QHS  . metoprolol succinate  25 mg Oral BID  . pneumococcal 23 valent vaccine  0.5 mL Intramuscular Tomorrow-1000  . sodium chloride flush  10 mL Intravenous Q12H  . traZODone  50 mg Oral QHS   Continuous Infusions: . lactated ringers 50 mL/hr at 09/11/19 0559    Principal Problem:   Atrial flutter (HCC) Active Problems:   CAD (coronary artery disease)   Hyperlipidemia   HTN (hypertension)   AKI (acute kidney injury) (HCC)   Atrial tachycardia (HCC)   OSA (obstructive sleep apnea)   COPD (chronic obstructive pulmonary disease) (HCC)   Chronic back pain   Gout   Type 2 diabetes mellitus ([place])   Allergic rhinitis   Pulmonary edema   Morbid obesity ([place])   LOS: 7 days   How to contact the [facility] Attending or Consulting provider [place] or covering provider during after hours 7P -7A, for this patient?  1. Check the care team in [facility] and look for a) attending/consulting TRH provider listed and b) the [facility] team listed 2. Log into [email or web address]  and use [place]'s universal password to access. If you do not have the password, please contact the hospital operator. 3. Locate the [facility] provider you are looking for under [facility] and page to a number that you can be directly reached. 4. If you still have difficulty reaching the provider, please page the [facility] (Director on Call) for the Hospitalists listed on amion for assistance.

## 2019-09-11 NOTE — Progress Notes (Signed)
Physical Therapy Treatment Patient Details Name: Aaron Ball MRN: [id number] DOB: 03/08/1951 Today's Date: 09/11/2019    History of Present Illness Patient is a 68 year old male admitted with atrial tachycardia. PMH includes: obesity, CAD, HLD, HTN, COPD, DM, chronic back pain. S/p cardioversion 12/28    PT Comments    Pt able to demonstrate progress with ambulatory distance and seated there ex today.  He was able to perform STS with use of RW, supervision but reported LBP throughout this movement.  He ambulated 75 ft with RW, 3L O2 and required 4 ~20 sec rest breaks during which pt expressed increased back pain (9/10).  Rn notified and in to administer pain medication afterwards.  Pt presented with a progressively unsteady gait as pain increased, though vitals remained WNL.  PT introduced seated there ex to manage back pain and pt was able to follow, open to all education.  Pt will continue to benefit from skilled PT with focus on strength, tolerance to activity, pain management and safe functional mobility.  Follow Up Recommendations  Home health PT;Supervision - Intermittent     Equipment Recommendations  None recommended by PT    Recommendations for Other Services       Precautions / Restrictions Precautions Precautions: Fall Restrictions Weight Bearing Restrictions: No    Mobility  Bed Mobility Overal bed mobility: (Pt on BSC at beginning of session, sitting at EOB with nursing at end.)                Transfers Overall transfer level: Needs assistance Equipment used: Rolling walker (2 wheeled) Transfers: Sit to/from Stand Sit to Stand: Supervision         General transfer comment: Uses UE and reports LBP with this motion.  Slow and effortful but with not physical assistance from PT.  Ambulation/Gait Ambulation/Gait assistance: Min guard Gait Distance (Feet): 90 Feet Assistive device: Rolling walker (2 wheeled) Gait Pattern/deviations: Step-through  pattern;Decreased step length - right;Decreased step length - left   Gait velocity interpretation: <1.8 ft/sec, indicate of risk for recurrent falls General Gait Details: Slow effortful and antalgic gait with 4 rest breaks which pt held to RW or railing with flexed posture and reported 9/10 pain.  Pt required to rest 20-25 sec each time.  O2 remained at 99% on 3L.   Stairs             Wheelchair Mobility    Modified Rankin (Stroke Patients Only)       Balance Overall balance assessment: Needs assistance Sitting-balance support: Feet supported Sitting balance-Leahy Scale: Good     Standing balance support: Bilateral upper extremity supported;During functional activity Standing balance-Leahy Scale: Good Standing balance comment: Able to stand without UE assistance briefly; appears less steady with increased standing/walking time as LBP increases.                            Cognition Arousal/Alertness: Awake/alert Behavior During Therapy: WFL for tasks assessed/performed Overall Cognitive Status: Within Functional Limits for tasks assessed                                        Exercises Other Exercises Other Exercises: Seated ther ex: leanback, leanback with marching x20, pillow squeeze with cues to lean back slightly to activate abdominal x12.  Education concerning benefit of strengthening abdominal muscles related to back  pain.  x10 min Other Exercises: Rest breaks, monitoring vitals and deep breathing exercises. x5 min    General Comments        Pertinent Vitals/Pain Pain Assessment: 0-10 Pain Score: 9  Pain Location: LBP with standing/walking. Pain Descriptors / Indicators: Grimacing;Guarding Pain Intervention(s): Monitored during session;Patient requesting pain meds-RN notified;Utilized relaxation techniques    Home Living                      Prior Function            PT Goals (current goals can now be found in the  care plan section) Acute Rehab PT Goals Patient Stated Goal: To get out of the hospital soon Progress towards PT goals: Progressing toward goals    Frequency    Min 2X/week      PT Plan Current plan remains appropriate    Co-evaluation              AM-PAC PT "6 Clicks" Mobility   Outcome Measure  Help needed turning from your back to your side while in a flat bed without using bedrails?: A Little Help needed moving from lying on your back to sitting on the side of a flat bed without using bedrails?: A Little Help needed moving to and from a bed to a chair (including a wheelchair)?: A Little Help needed standing up from a chair using your arms (e.g., wheelchair or bedside chair)?: A Little Help needed to walk in hospital room?: A Little Help needed climbing 3-5 steps with a railing? : A Little 6 Click Score: 18    End of Session Equipment Utilized During Treatment: Gait belt Activity Tolerance: Patient limited by pain;Patient limited by fatigue Patient left: with call bell/phone within reach;in bed;with nursing/sitter in room;with bed alarm set Nurse Communication: Mobility status PT Visit Diagnosis: Muscle weakness (generalized) (M62.81);Difficulty in walking, not elsewhere classified (R26.2)     Time: 4580-9983 PT Time Calculation (min) (ACUTE ONLY): 30 min  Charges:  $Therapeutic Exercise: 8-22 mins $Therapeutic Activity: 8-22 mins                    Roxanne Gates, PT, DPT    Roxanne Gates 09/11/2019, 9:55 AM

## 2019-09-11 NOTE — Progress Notes (Signed)
Spoke with patient's son Aaron Edelman about patient current condition and plan of care. Patient has been updated about kidney function and continued stay at the hospital. Patient is aware of the conversation.

## 2019-09-11 NOTE — Progress Notes (Signed)
Progress Note  Patient Name: Aaron Ball Date of Encounter: 09/11/2019  Primary Cardiologist: [facility]  Subjective   Denies chest pain, racing heart rate, or palpitations.  Reports that he does not use oxygen at home; however, he was started on 3L nasal cannula oxygen.  He has not yet ambulated and indicates the need for assistance due to his L toe / gout with ambulation. He has noticed his bilateral LEE.  Inpatient Medications    Scheduled Meds: . allopurinol  100 mg Oral Daily  . ALPRAZolam  0.25 mg Oral Daily  . apixaban  5 mg Oral BID  . atorvastatin  40 mg Oral Daily  . Chlorhexidine Gluconate Cloth  6 each Topical Daily  . colchicine  0.6 mg Oral Daily  . gabapentin  300 mg Oral QHS  . loratadine  10 mg Oral QHS  . metoprolol succinate  25 mg Oral BID  . pneumococcal 23 valent vaccine  0.5 mL Intramuscular Tomorrow-1000  . sodium chloride flush  10 mL Intravenous Q12H  . traZODone  50 mg Oral QHS   Continuous Infusions: . lactated ringers 50 mL/hr at 09/11/19 0559   PRN Meds: acetaminophen **OR** acetaminophen, bisacodyl, nitroGLYCERIN, ondansetron **OR** ondansetron (ZOFRAN) IV, polyethylene glycol, simethicone, tiZANidine, traMADol   Vital Signs    Vitals:   09/11/19 0558 09/11/19 0605 09/11/19 0755 09/11/19 0945  BP:  118/79 (!) 114/54   Pulse:  100 81 99  Resp:   16   Temp:  98.1 F (36.7 C) 97.9 F (36.6 C)   TempSrc:  Oral Oral   SpO2:  100% 99% 99%  Weight: (!) 146.7 kg     Height:        Intake/Output Summary (Last 24 hours) at 09/11/2019 1041 Last data filed at 09/11/2019 1018 Gross per 24 hour  Intake 1265.87 ml  Output 600 ml  Net 665.87 ml   Last 3 Weights 09/11/2019 09/10/2019 09/09/2019  Weight (lbs) 323 lb 6.4 oz 321 lb 3.2 oz 315 lb 14.7 oz  Weight (kg) 146.693 kg 145.695 kg 143.3 kg      Telemetry    NSR, 80s to 90s, PACs- Personally Reviewed  ECG    No new tracings- Personally Reviewed  Physical Exam    GEN: No acute distress.  Lying in bed. Neck:  JVD difficult to assess due to body habitus Cardiac: RRR with occasional extrasystole appreciated. No murmurs, rubs, or gallops.  Respiratory: Distant breath sounds. On 3L  oxygen today (no home oxygen) GI: Soft, nontender, non-distended  MS: 1+ bilateral pretibial edema; No deformity. Neuro:  Nonfocal  Psych: Normal affect   Labs    High Sensitivity Troponin:   Recent Labs  Lab 09/03/19 0955 09/03/19 1218  TROPONINIHS 6 4      Cardiac EnzymesNo results for input(s): TROPONINI in the last 168 hours. No results for input(s): TROPIPOC in the last 168 hours.   Chemistry Recent Labs  Lab 09/10/19 0528 09/10/19 0909 09/11/19 0515  NA 136 133* 135  K 4.7 5.0 4.6  CL 102 99 100  CO2 24 24 24   GLUCOSE 141* 167* 137*  BUN 31* 34* 36*  CREATININE 2.32* 2.52* 1.91*  CALCIUM 8.3* 8.4* 8.4*  GFRNONAA 28* 25* 35*  GFRAA 32* 29* 41*  ANIONGAP 10 10 11      Hematology Recent Labs  Lab 09/08/19 0601 09/10/19 0528 09/11/19 0500  WBC 10.3 12.8* 8.2  RBC 3.91* 3.48* 3.65*  HGB 11.8* 10.5* 11.0*  HCT  37.7* 34.3* 34.5*  MCV 96.4 98.6 94.5  MCH 30.2 30.2 30.1  MCHC 31.3 30.6 31.9  RDW 14.6 14.7 14.6  PLT 231 199 222    BNPNo results for input(s): BNP, PROBNP in the last 168 hours.   DDimer No results for input(s): DDIMER in the last 168 hours.   Radiology    No results found.  Cardiac Studies   TEE (09/10/2019): 1. Left ventricular ejection fraction, by visual estimation, is 55 to 60%. The left ventricle has normal function. There is borderline left ventricular hypertrophy. 2. The left ventricle is not well visualized. 3. Global right ventricle was not assessed.The right ventricular size is normal. Right vetricular wall thickness was not assessed. 4. Left atrial size was not assessed. 5. No left atrial or left atrial appendage thrombus was seen. 6. Right atrial size was not well visualized. 7. The pericardium  was not assessed. 8. The mitral valve is normal in structure. Trivial mitral valve regurgitation. 9. The tricuspid valve is not well visualized. 10. The aortic valve is tricuspid. Aortic valve regurgitation is trivial. 11. The pulmonic valve was normal in structure. Pulmonic valve regurgitation is trivial. 12. Mild plaque invoving the descending aorta.  TTE (09/04/2019): 1. Left ventricular ejection fraction, by visual estimation, is 55 to 60%. The left ventricle has normal function. Unable to exclude hypertrophy or wall motion abnormality. 2. Left ventricular diastolic parameters are indeterminate. 3. Global right ventricle was not well visualized. 4. Left atrial size was not well visualized. 5. The mitral valve was not well visualized. 6. The tricuspid valve is not well visualized. 7. The aortic valve was not well visualized. 8. The pulmonic valve was not well visualized. 9. TR signal is inadequate for assessing pulmonary artery systolic pressure.  Patient Profile     68 y.o. male with history of CAD s/p PCI to pRCA (1997) complicated by ISR (2003, 2004) and s/p PCI /DES to mRCA (05/2013), persistent Afib s/p ablation x2 (2010, 2012), HFpEF, pSVT, DM2, COPD, previous tobacco use (quit 2005), morbid obesity s/p gastrectomy, OHS/OSA on CPAP, hyperparathyroidism, and GERD, admitted with Aflutter and difficult to control ventricular rates s/p TEE/DCCV and maintaining NSR.  Assessment & Plan    Atrial flutter  --S/p TEE/DCCV.  Maintaining NSR with PACs with rates elevated and BB increased to Toprol XL 50mg  daily. Closely monitor BP with this increase, given yesterday's noted increase in Cr 2/2 hypotension. Soft pressures this AM. Consider transitioning back to lopressor while titrate BB to prevent AKI 2/2 prerenal etiology. Continue apixaban 5 mg twice daily. After discharge[facility]  Acute on chronic HFpEF --Volume overloaded on exam and feels SOB  on 3L [place] oxygen, started yesterday evening. Not on home oxygen. Diuresis held earlier in admission due to AKI thought likely due to over-diuresis with Cr improving until a slight bump yesterday, attributed to hypotension 2/2 anesthesia with TEE/DCCV. Today, Cr again improved on AM labs and approaching baseline.  Recommend maintain net even fluid balance with restart of diuresis and with close monitoring of renal function. Net +775.9cc over the last 24 hours. Weight 136.1kg  142kg  146.7kg. Continue to monitor I/O, daily standing weights. Continue increased dose BB if BP tolerates with caution to avoid prerenal AKI 2/2 hypotension. Continue to hold ACE given earlier AKI with plan for restart of diuresis as outlined above and 2/2 noted volume overload today.  AKI --AKI noted earlier in the admission and thought to be due to earlier IV overdiuresis. Improved  Cr with holding diuresis until bump in creatinine yesterday thought 2/2 hypotension in the setting of anesthesia during TEE/DCCV.  Creatinine improved again today and 2.52  1.91 with BUN 34  36. Recommend maintain net even fluid balance; therefore, recommend restart of diuresis and close monitoring of renal function given earlier AKI. Continue to hold ACE for now.   CAD --No CP.  Continue secondary prevention with statin.  No ASA as on Eliquis in setting of Aflutter s/p TEE/DCCV. Continue BB as BP allows given soft pressures and to avoid further increase in Cr 2/2 prerenal etiology. Continue to hold ACE for now.   OSA --Continue CPAP. On [place] oxygen at 3L/min.   For questions or updates, please contact [facility] Please consult [email or web address] for contact info under        Signed, Lennon Alstrom, PA-C  09/11/2019, 10:41 AM

## 2019-09-12 LAB — BASIC METABOLIC PANEL
Anion gap: 6 (ref 5–15)
BUN: 26 mg/dL — ABNORMAL HIGH (ref 8–23)
CO2: 28 mmol/L (ref 22–32)
Calcium: 8.3 mg/dL — ABNORMAL LOW (ref 8.9–10.3)
Chloride: 102 mmol/L (ref 98–111)
Creatinine, Ser: 1.21 mg/dL (ref 0.61–1.24)
GFR calc Af Amer: >60 mL/min (ref >60)
GFR calc non Af Amer: >60 mL/min (ref >60)
Glucose, Bld: 152 mg/dL — ABNORMAL HIGH (ref 70–99)
Potassium: 4.9 mmol/L (ref 3.5–5.1)
Sodium: 136 mmol/L (ref 135–145)

## 2019-09-12 MED ORDER — FUROSEMIDE 20 MG PO TABS: 20.0000 mg | ORAL_TABLET | Freq: Two times a day (BID) | ORAL | 1 refills | Status: DC

## 2019-09-12 MED ORDER — METOPROLOL SUCCINATE ER 25 MG PO TB24: 25.0000 mg | ORAL_TABLET | Freq: Two times a day (BID) | ORAL | 0 refills | Status: DC

## 2019-09-12 MED ORDER — APIXABAN 5 MG PO TABS: 5.0000 mg | ORAL_TABLET | Freq: Two times a day (BID) | ORAL | 1 refills | Status: DC

## 2019-09-12 NOTE — Care Management Important Message (Signed)
Important Message  Patient Details  Name: Balen Woolum MRN: [id number] Date of Birth: 11/07/1950   Medicare Important Message Given:  Yes     Dannette Barbara 09/12/2019, 11:22 AM

## 2019-09-12 NOTE — Discharge Summary (Signed)
Physician Discharge Summary  Aaron Ball ZOX:[id number] DOB: 02/22/1951 DOA: 09/03/2019  PCP: System, Pcp Not In  Admit date: 09/03/2019 Discharge date: 09/12/2019  Recommendations for Outpatient Follow-up:  Routine post hospitalization care Patient will arrange follow up with his cardiologist in the outpatient setting    Discharge Diagnoses: Principal diagnosis is #1 1. Typical atrial flutter with RVR 2. Acute diastolic CHF secondary to tachyarrhythmia 3. AKI thought secondary to hypotension 4. Coronary artery disease 5. Essential hypertension 6. Diabetes mellitus type 2  Discharge Condition: improved Disposition: home with HHPT, CSW, aide  Diet recommendation: Heart healthy, diabetic diet  Filed Weights   09/10/19 0602 09/11/19 0558 09/12/19 0535  Weight: (!) 145.7 kg (!) 146.7 kg (!) 143.8 kg    History of present illness:  68 year old man PMH including CAD status post PCI, persistent atrial fibrillation status post ablation twice, diabetes mellitus type 2, morbid obesity, not on outpatient anticoagulation followed by [facility] cardiology presented with tachypalpitations, found to have narrow complex tachycardia treated with adenosine in the emergency department without significant effect.  Admitted for further treatment for atypical atrial flutter with RVR.    Hospital Course:  He was followed by cardiology with multiple efforts to manage his rate with pharmacotherapy which failed.  He subsequently underwent DC cardioversion 12/28 with success.  He developed a brief AKI which resolved and hypoxic respiratory failure which also resolved.  Home health occupational therapy recommended.  Physical therapy recommended home health.  Typical atrial flutter with RVR, status post TEE cardioversion, follow-up with cardiology controlled despite maximum metoprolol, digoxin and diltiazem.  Subsequently converted to sinus rhythm with DCCV. --Doing well, remains in sinus rhythm,  continue Toprol-XL 25 mg twice daily, Eliquis 5 mg twice daily, Lasix 20 mg twice daily. --  Follow-up with cardiology at [facility].  Acute diastolic CHF secondary to tachyarrhythmia --Appears euvolemic.  Lasix resumed.  Acute hypoxic respiratory failure probably secondary to tachyarrhythmia, resolved.  AKI thought secondary to hypotension --Slowly improving.  No further management indicated at this point.  Coronary artery disease --Stable.  Continue aspirin, Plavix  Essential hypertension --Stable.  Continue Cardizem, beta-blocker  Diabetes mellitus type 2 --CBG stable.  Anxiety --Continue Xanax on discharge  Morbid obesity Body mass index is 52.77 kg/m.  OSA   Significant Hospital Events    12/22 admitted for atrial flutter with rapid ventricular response  Consults:   Cardiology  Procedures:   12/28 TEE in preparation for DC cardioversion  12/28 DC cardioversion  Significant Diagnostic Tests:   12/23 2D echocardiogram LVEF 55 to 60%.  1228 transesophageal echocardiogram LVEF 55-60%.  No thrombus noted.  Today's assessment: S: Feels well, no complaints. O: Vitals:  Vitals:   09/12/19 0746 09/12/19 1514  BP: (!) 96/43 123/65  Pulse: 80 79  Resp: 18 19  Temp: 98 F (36.7 C) 98.2 F (36.8 C)  SpO2: 100% 97%    Constitutional:  . Appears calm and comfortable Respiratory:  . CTA bilaterally, no w/r/r.  . Respiratory effort normal.  Cardiovascular:  . RRR, no m/r/g . No significant LE extremity edema   Psychiatric:  . Mental status o Mood, affect appropriate  BMP unremarkable.  Renal function has normalized.  Discharge Instructions  Discharge Instructions    (HEART FAILURE PATIENTS) Call MD:  Anytime you have any of the following symptoms: 1) 3 pound weight gain in 24 hours or 5 pounds in 1 week 2) shortness of breath, with or without a dry hacking cough 3) swelling in the hands,  feet or stomach 4) if you have to sleep on extra pillows  at night in order to breathe.   Complete by: As directed    Diet - low sodium heart healthy   Complete by: As directed    Discharge instructions   Complete by: As directed    Call your physician or seek immediate medical attention for shortness of breath, heart racing, weight gain, swelling or worsening of condition.   Increase activity slowly   Complete by: As directed      Allergies as of 09/12/2019      Reactions   Erythromycin Hives, Anaphylaxis   Cyclobenzaprine Palpitations   Triggered a fib      Medication List    STOP taking these medications   meloxicam 7.5 MG tablet Commonly known as: MOBIC   metoprolol tartrate 50 MG tablet Commonly known as: LOPRESSOR     TAKE these medications   acetaminophen 500 MG tablet Commonly known as: TYLENOL Take 1,000 mg by mouth every 4 (four) hours as needed for pain.   allopurinol 300 MG tablet Commonly known as: ZYLOPRIM Take 300 mg by mouth daily.   ALPRAZolam 0.25 MG tablet Commonly known as: XANAX Take 0.25 mg by mouth 2 (two) times daily as needed for sleep.   apixaban 5 MG Tabs tablet Commonly known as: ELIQUIS Take 1 tablet (5 mg total) by mouth 2 (two) times daily.   aspirin 81 MG EC tablet Take 81 mg by mouth daily.   atorvastatin 40 MG tablet Commonly known as: LIPITOR Take 1 tablet by mouth daily.   colchicine 0.6 MG tablet Take 0.6 mg by mouth daily.   furosemide 20 MG tablet Commonly known as: LASIX Take 1 tablet (20 mg total) by mouth 2 (two) times daily. What changed:   medication strength  how much to take  when to take this  reasons to take this   gabapentin 300 MG capsule Commonly known as: NEURONTIN Take 300 mg by mouth at bedtime.   lisinopril 20 MG tablet Commonly known as: ZESTRIL Take 20 mg by mouth 2 (two) times daily.   loratadine 10 MG tablet Commonly known as: CLARITIN Take 10 mg by mouth at bedtime.   metoprolol succinate 25 MG 24 hr tablet Commonly known as:  TOPROL-XL Take 1 tablet (25 mg total) by mouth 2 (two) times daily.   nitroGLYCERIN 0.4 MG SL tablet Commonly known as: NITROSTAT Place 0.4 mg under the tongue every 5 (five) minutes as needed.   tiZANidine 2 MG tablet Commonly known as: ZANAFLEX Take 2 mg by mouth every 8 (eight) hours as needed.   traMADol 50 MG tablet Commonly known as: ULTRAM Take 50 mg by mouth every 8 (eight) hours as needed for pain.   traZODone 50 MG tablet Commonly known as: DESYREL Take 50 mg by mouth at bedtime.      Allergies  Allergen Reactions  . Erythromycin Hives and Anaphylaxis  . Cyclobenzaprine Palpitations    Triggered a fib    The results of significant diagnostics from this hospitalization (including imaging, microbiology, ancillary and laboratory) are listed below for reference.    Significant Diagnostic Studies: DG Chest 2 View  Result Date: 09/03/2019 CLINICAL DATA:  Heart palpitations EXAM: CHEST - 2 VIEW COMPARISON:  12/15/2017 FINDINGS: The heart is mildly enlarged but stable. Stable tortuosity and ectasia of the thoracic aorta. Chronic bronchitic type lung changes and probable emphysema. No definite acute overlying pulmonary process. No pleural effusion. The bony thorax is  intact. IMPRESSION: Stable cardiac enlargement. Chronic bronchitic and probable emphysematous changes but no acute pulmonary findings. Electronically Signed   By: Marijo Sanes M.D.   On: 09/03/2019 10:27   ECHOCARDIOGRAM COMPLETE  Result Date: 09/04/2019   ECHOCARDIOGRAM REPORT   Patient Name:   KAYDYN SAYAS Camerer Date of Exam: 09/04/2019 Medical Rec #:  [id number]          Height:       65.0 in Accession #:    [id number]         Weight:       325.5 lb Date of Birth:  07-07-1951         BSA:          2.43 m Patient Age:    28 years           BP:           122/72 mmHg Patient Gender: M                  HR:           68 bpm. Exam Location:  [facility] Procedure: 2D Echo, Color Doppler and Cardiac Doppler Indications:      Dyspnea 786.09  History:         Patient has prior history of Echocardiogram examinations, most                  recent 10/09/2015. CAD, COPD, Arrythmias:Atrial Fibrillation;                  Risk Factors:Diabetes, Hypertension and Sleep Apnea.  Sonographer:     Sherrie Sport RDCS (AE) Referring Phys:  [id number] [place] Diagnosing Phys: Ida Rogue MD  Sonographer Comments: Technically difficult study due to poor echo windows, no subcostal window, no apical window and suboptimal parasternal window. IMPRESSIONS  1. Left ventricular ejection fraction, by visual estimation, is 55 to 60%. The left ventricle has normal function. Unable to exclude hypertrophy or wall motion abnormality.  2. Left ventricular diastolic parameters are indeterminate.  3. Global right ventricle was not well visualized.  4. Left atrial size was not well visualized.  5. The mitral valve was not well visualized.  6. The tricuspid valve is not well visualized.  7. The aortic valve was not well visualized.  8. The pulmonic valve was not well visualized.  9. TR signal is inadequate for assessing pulmonary artery systolic pressure. FINDINGS  Left Ventricle: Left ventricular ejection fraction, by visual estimation, is 55 to 60%. The left ventricle has normal function. The left ventricle is not well visualized. There is no left ventricular hypertrophy. Left ventricular diastolic parameters are indeterminate. Normal left atrial pressure. Right Ventricle: The right ventricular size is not well visualized. Right vetricular wall thickness was not assessed. Global RV systolic function is was not well visualized. Left Atrium: Left atrial size was not well visualized. Right Atrium: Right atrial size was not well visualized Pericardium: There is no evidence of pericardial effusion. Mitral Valve: The mitral valve was not well visualized. No evidence of mitral valve regurgitation. No evidence of mitral valve stenosis by observation. Tricuspid Valve: The  tricuspid valve is not well visualized. Tricuspid valve regurgitation is not demonstrated. Aortic Valve: The aortic valve was not well visualized. Aortic valve regurgitation is not visualized. The aortic valve is structurally normal, with no evidence of sclerosis or stenosis. Pulmonic Valve: The pulmonic valve was not well visualized. Pulmonic valve regurgitation is not visualized. Pulmonic regurgitation is not  visualized. Aorta: The aortic root, ascending aorta and aortic arch are all structurally normal, with no evidence of dilitation or obstruction. Venous: The inferior vena cava is normal in size with greater than 50% respiratory variability, suggesting right atrial pressure of 3 mmHg. IAS/Shunts: No atrial level shunt detected by color flow Doppler. There is no evidence of a patent foramen ovale. No ventricular septal defect is seen or detected. There is no evidence of an atrial septal defect.  LEFT VENTRICLE PLAX 2D LVIDd:         3.07 cm LVIDs:         2.24 cm LV PW:         1.39 cm LV IVS:        1.92 cm LVOT diam:     2.00 cm LV SV:         20 ml LV SV Index:   7.45 LVOT Area:     3.14 cm  LEFT ATRIUM         Index LA diam:    3.10 cm 1.27 cm/m                        PULMONIC VALVE AORTA                 PV Vmax:        0.62 m/s Ao Root diam: 3.20 cm PV Peak grad:   1.5 mmHg                       RVOT Peak grad: 2 mmHg   SHUNTS Systemic Diam: 2.00 cm  Julien Nordmann MD Electronically signed by Julien Nordmann MD Signature Date/Time: 09/04/2019/6:02:47 PM    Final    ECHO TEE  Result Date: 09/09/2019   TRANSESOPHOGEAL ECHO REPORT   Patient Name:   Aaron Ball Date of Exam: 09/09/2019 Medical Rec #:  [id number]          Height:       65.0 in Accession #:    [id number]         Weight:       315.9 lb Date of Birth:  08/15/1951         BSA:          2.40 m Patient Age:    68 years           BP:           113/72 mmHg Patient Gender: M                  HR:           71 bpm. Exam Location:  [facility]   Procedure: Transesophageal Echo, Cardiac Doppler and Color Doppler Indications:     Atrial Flutter 427.32  History:         Patient has prior history of Echocardiogram examinations, most                  recent 09/04/2019.  Sonographer:     Cristela Blue RDCS (AE) Referring Phys:  [id number] [place] Diagnosing Phys: Cristal Deer End MD  PROCEDURE: Patients was monitored while under deep sedation. The transesophogeal probe was passed through the esophogus of the patient. The patient developed no complications during the procedure. IMPRESSIONS  1. Left ventricular ejection fraction, by visual estimation, is 55 to 60%. The left ventricle has normal function. There is borderline left ventricular hypertrophy.  2. The  left ventricle is not well visualized.  3. Global right ventricle was not assessed.The right ventricular size is normal. Right vetricular wall thickness was not assessed.  4. Left atrial size was not assessed.  5. No left atrial or left atrial appendage thrombus was seen.  6. Right atrial size was not well visualized.  7. The pericardium was not assessed.  8. The mitral valve is normal in structure. Trivial mitral valve regurgitation.  9. The tricuspid valve is not well visualized. 10. The aortic valve is tricuspid. Aortic valve regurgitation is trivial. 11. The pulmonic valve was normal in structure. Pulmonic valve regurgitation is trivial. 12. Mild plaque invoving the descending aorta. FINDINGS  Left Ventricle: Left ventricular ejection fraction, by visual estimation, is 55 to 60%. The left ventricle has normal function. The left ventricle is not well visualized. There is borderline left ventricular hypertrophy. Right Ventricle: The right ventricular size is normal. Right vetricular wall thickness was not assessed. Global RV systolic function is was not assessed. Left Atrium: Left atrial size was not assessed. No left atrial or left atrial appendage thrombus was seen. Right Atrium: Right atrial size was not  well visualized Pericardium: The pericardium was not assessed. Mitral Valve: The mitral valve is normal in structure. Trivial mitral valve regurgitation. Tricuspid Valve: The tricuspid valve is not well visualized. Tricuspid valve regurgitation is mild. Aortic Valve: The aortic valve is tricuspid. Aortic valve regurgitation is trivial. Pulmonic Valve: The pulmonic valve was normal in structure. Pulmonic valve regurgitation is trivial. Aorta: The aortic root and ascending aorta are structurally normal, with no evidence of dilitation. There is mild plaque involving the descending aorta. Shunts: No atrial level shunt detected by color flow Doppler.  Yvonne Kendallhristopher End MD Electronically signed by Yvonne Kendallhristopher End MD Signature Date/Time: 09/09/2019/6:20:22 PM    Final     Microbiology: Recent Results (from the past 240 hour(s))  SARS CORONAVIRUS 2 (TAT 6-24 HRS) Nasopharyngeal Nasopharyngeal Swab     Status: None   Collection Time: 09/03/19 11:05 AM   Specimen: Nasopharyngeal Swab  Result Value Ref Range Status   SARS Coronavirus 2 NEGATIVE NEGATIVE Final    Comment: (NOTE) SARS-CoV-2 target nucleic acids are NOT DETECTED. The SARS-CoV-2 RNA is generally detectable in upper and lower respiratory specimens during the acute phase of infection. Negative results do not preclude SARS-CoV-2 infection, do not rule out co-infections with other pathogens, and should not be used as the sole basis for treatment or other patient management decisions. Negative results must be combined with clinical observations, patient history, and epidemiological information. The expected result is Negative. Fact Sheet for Patients: HairSlick.nohttps://www.fda.gov/media/138098/download Fact Sheet for Healthcare Providers: quierodirigir.comhttps://www.fda.gov/media/138095/download This test is not yet approved or cleared by the [place] FDA and  has been authorized for detection and/or diagnosis of SARS-CoV-2 by FDA under an Emergency Use  Authorization (EUA). This EUA will remain  in effect (meaning this test can be used) for the duration of the COVID-19 declaration under Section 56 4(b)(1) of the Act, 21 U.S.C. section 360bbb-3(b)(1), unless the authorization is terminated or revoked sooner. Performed at [facility], [address] [place], [place] [address]      Labs: Basic Metabolic Panel: Recent Labs  Lab 09/08/19 0601 09/10/19 0528 09/10/19 0909 09/11/19 0515 09/12/19 0716  NA 136 136 133* 135 136  K 4.2 4.7 5.0 4.6 4.9  CL 101 102 99 100 102  CO2 23 24 24 24 28   GLUCOSE 158* 141* 167* 137* 152*  BUN 21  31* 34* 36* 26*  CREATININE 1.13 2.32* 2.52* 1.91* 1.21  CALCIUM 8.6* 8.3* 8.4* 8.4* 8.3*   CBC: Recent Labs  Lab 09/07/19 0602 09/08/19 0601 09/10/19 0528 09/11/19 0500  WBC 10.2 10.3 12.8* 8.2  HGB 11.9* 11.8* 10.5* 11.0*  HCT 36.8* 37.7* 34.3* 34.5*  MCV 92.7 96.4 98.6 94.5  PLT 235 231 199 222    Recent Labs    09/03/19 0955 09/11/19 0816  BNP 125.0* 36.0    CBG: Recent Labs  Lab 09/09/19 0740  GLUCAP 164*    Principal Problem:   Atrial flutter (HCC) Active Problems:   CAD (coronary artery disease)   Hyperlipidemia   HTN (hypertension)   AKI (acute kidney injury) (HCC)   Atrial tachycardia (HCC)   OSA (obstructive sleep apnea)   COPD (chronic obstructive pulmonary disease) (HCC)   Chronic back pain   Gout   Type 2 diabetes mellitus (HCC)   Allergic rhinitis   Pulmonary edema   Morbid obesity (HCC)   Time coordinating discharge: 40 minutes  Signed:  Brendia Sacks, MD  [facility]  09/12/2019, 7:42 PM

## 2019-09-12 NOTE — Progress Notes (Signed)
Ambulated patient in hall.  Steady on feet.  Patient on room air with O2 sat 93%.  MD aware.

## 2019-09-12 NOTE — Plan of Care (Signed)

## 2019-09-12 NOTE — Progress Notes (Addendum)
Progress Note  Patient Name: Aaron HammanWilliam Toney Ball Date of Encounter: 09/12/2019  [facility]  Subjective   Continues to deny chest pain, racing heart rate, or palpitations.  Reports that he does not use oxygen at home and currently on 1L nasal cannula oxygen. Improved SOB and bilateral LEE. He has not yet ambulated.  Inpatient Medications    Scheduled Meds: . allopurinol  100 mg Oral Daily  . ALPRAZolam  0.25 mg Oral Daily  . apixaban  5 mg Oral BID  . atorvastatin  40 mg Oral Daily  . Chlorhexidine Gluconate Cloth  6 each Topical Daily  . colchicine  0.6 mg Oral Daily  . furosemide  20 mg Oral BID  . gabapentin  300 mg Oral QHS  . loratadine  10 mg Oral QHS  . metoprolol succinate  25 mg Oral BID  . pneumococcal 23 valent vaccine  0.5 mL Intramuscular Tomorrow-1000  . sodium chloride flush  10 mL Intravenous Q12H  . traZODone  50 mg Oral QHS   Continuous Infusions:  PRN Meds: acetaminophen **OR** acetaminophen, bisacodyl, nitroGLYCERIN, ondansetron **OR** ondansetron (ZOFRAN) IV, polyethylene glycol, simethicone, tiZANidine, traMADol   Vital Signs    Vitals:   09/11/19 1942 09/12/19 0339 09/12/19 0535 09/12/19 0746  BP: (!) 118/58 (!) 106/34  (!) 96/43  Pulse: 82 78  80  Resp:    18  Temp: 98 F (36.7 C) 97.7 F (36.5 C)  98 F (36.7 C)  TempSrc: Oral Oral  Oral  SpO2: 100% 100%  100%  Weight:   (!) 143.8 kg   Height:        Intake/Output Summary (Last 24 hours) at 09/12/2019 1028 Last data filed at 09/12/2019 0700 Gross per 24 hour  Intake 1127.54 ml  Output 200 ml  Net 927.54 ml   Last 3 Weights 09/12/2019 09/11/2019 09/10/2019  Weight (lbs) 317 lb 1.6 oz 323 lb 6.4 oz 321 lb 3.2 oz  Weight (kg) 143.836 kg 146.693 kg 145.695 kg      Telemetry    NSR, 70-80s- Personally Reviewed  ECG    No new tracings- Personally Reviewed  Physical Exam   GEN: No acute distress.  Lying in bed. Neck:  JVD difficult to assess due to  body habitus Cardiac: RRR. No murmurs, rubs, or gallops.  Respiratory: Distant breath sounds. On 1L [place] oxygen. GI: Soft, nontender, non-distended  MS: moderate to 1+ bilateral pretibial edema; No deformity. Neuro:  Nonfocal  Psych: Normal affect   Labs    High Sensitivity Troponin:   Recent Labs  Lab 09/03/19 0955 09/03/19 1218  TROPONINIHS 6 4      Cardiac EnzymesNo results for input(s): TROPONINI in the last 168 hours. No results for input(s): TROPIPOC in the last 168 hours.   Chemistry Recent Labs  Lab 09/10/19 0909 09/11/19 0515 09/12/19 0716  NA 133* 135 136  K 5.0 4.6 4.9  CL 99 100 102  CO2 24 24 28   GLUCOSE 167* 137* 152*  BUN 34* 36* 26*  CREATININE 2.52* 1.91* 1.21  CALCIUM 8.4* 8.4* 8.3*  GFRNONAA 25* 35* >60  GFRAA 29* 41* >60  ANIONGAP 10 11 6      Hematology Recent Labs  Lab 09/08/19 0601 09/10/19 0528 09/11/19 0500  WBC 10.3 12.8* 8.2  RBC 3.91* 3.48* 3.65*  HGB 11.8* 10.5* 11.0*  HCT 37.7* 34.3* 34.5*  MCV 96.4 98.6 94.5  MCH 30.2 30.2 30.1  MCHC 31.3 30.6 31.9  RDW 14.6 14.7  14.6  PLT 231 199 222    BNP Recent Labs  Lab 09/11/19 0816  BNP 36.0     DDimer No results for input(s): DDIMER in the last 168 hours.   Radiology    No results found.  Cardiac Studies   TEE (09/10/2019): 1. Left ventricular ejection fraction, by visual estimation, is 55 to 60%. The left ventricle has normal function. There is borderline left ventricular hypertrophy. 2. The left ventricle is not well visualized. 3. Global right ventricle was not assessed.The right ventricular size is normal. Right vetricular wall thickness was not assessed. 4. Left atrial size was not assessed. 5. No left atrial or left atrial appendage thrombus was seen. 6. Right atrial size was not well visualized. 7. The pericardium was not assessed. 8. The mitral valve is normal in structure. Trivial mitral valve regurgitation. 9. The tricuspid valve is not well  visualized. 10. The aortic valve is tricuspid. Aortic valve regurgitation is trivial. 11. The pulmonic valve was normal in structure. Pulmonic valve regurgitation is trivial. 12. Mild plaque invoving the descending aorta.  TTE (09/04/2019): 1. Left ventricular ejection fraction, by visual estimation, is 55 to 60%. The left ventricle has normal function. Unable to exclude hypertrophy or wall motion abnormality. 2. Left ventricular diastolic parameters are indeterminate. 3. Global right ventricle was not well visualized. 4. Left atrial size was not well visualized. 5. The mitral valve was not well visualized. 6. The tricuspid valve is not well visualized. 7. The aortic valve was not well visualized. 8. The pulmonic valve was not well visualized. 9. TR signal is inadequate for assessing pulmonary artery systolic pressure.  Patient Profile     68 y.o. male with history of CAD s/p PCI to pRCA (7902) complicated by ISR (4097, 2004) and s/p PCI /DES to mRCA (05/2013), persistent Afib s/p ablation x2 (2010, 2012), HFpEF, pSVT, DM2, COPD, previous tobacco use (quit 2005), morbid obesity s/p gastrectomy, OHS/OSA on CPAP, hyperparathyroidism, and GERD, admitted with Aflutter and difficult to control ventricular rates s/p TEE/DCCV and maintaining NSR.  Assessment & Plan    Atrial flutter  --S/p TEE/DCCV.  Maintaining NSR with rates now improved following increase of Toprol XL to 50mg  daily. Pressures remain soft this AM with patient asymptomatic and should be monitored closely to prevent prerenal AKI. Continue apixaban 5 mg twice daily. After discharge, follow-up with primary [facility] cardiologist.  Acute on chronic HFpEF --Reports improved SOB and LEE. Not on home oxygen and currently on 1L [place]. Wean oxygen as tolerated and check oxygen saturations at rest and with ambulation before discharge to ensure stable off of oxygen. Diuresis this admission has been complicated by earlier AKI with Cr now at  baseline. Weight 136.1kg  142kg  146.7kg  143.8kg. Restarted lasix yesterday with improved volume status on today's exam. Discontinued fluids today, given Cr at baseline. Continue diuresis with titration as needed to maintain net even fluid balance with close monitoring of renal function. Monitor I/O, daily weights, daily BMET. Continue BB. Continue to hold ACE given soft pressures with plan for restart once pressure allows and with close monitoring of renal function.  CAD --No CP.  Continue secondary prevention with statin.  No ASA as on Eliquis in setting of Aflutter s/p TEE/DCCV. Continue BB with close monitoring of BP given soft pressures and to avoid prerenal AKI. Continue to hold ACE for now given soft pressures.   OSA --Continue CPAP. On [place] oxygen at 1L/min. Recommend wean oxygen as tolerated with oxygen saturations checked  at rest and with ambulation before discharge.   For questions or updates, please contact [facility] Please consult [email or web address] for contact info under        Signed, Lennon Alstrom, PA-C  09/12/2019, 10:28 AM

## 2019-09-12 NOTE — TOC Transition Note (Signed)
Transition of Care [facility]) - CM/SW Discharge Note   Patient Details  Name: Aaron Ball MRN: [id number] Date of Birth: 06-25-1951  Transition of Care [facility] CM/SW Contact:  Ross Ludwig, LCSW Phone Number: 09/12/2019, 4:17 PM   Clinical Narrative:    Patient will be discharging back home with home health PT, Aide, and social worker.  Patient is a new Eliquis patient, CSW provided coupon for him to get his medication.  Patient will be discharging via cab, due to family not available to pick patient up.   Final next level of care: [place] Barriers to Discharge: Barriers Resolved   Patient Goals and CMS Choice Patient states their goals for this hospitalization and ongoing recovery are:: To return back home with home health. CMS Medicare.gov Compare Post Acute Care list provided to:: Patient Choice offered to / list presented to : Patient  Discharge Placement  Patient will be discharging back home with home health services through [facility].  Corene Cornea aware of patient discharging today.              Patient to be transferred to facility by: Cab Name of family member notified: Patient's son and daughter are aware that patient is discharging today. Patient and family notified of of transfer: 09/12/19  Discharge Plan and Services   Discharge Planning Services: CM Consult            DME Arranged: N/A DME Agency: NA       HH Arranged: PT, Nurse's Aide, Social Work [employer] Agency: Microbiologist ([place]) Date [place]: 09/12/19 Time May: 1400 Representative spoke with at [place]: [place] ([place]) Interventions     Readmission Risk Interventions No flowsheet data found.

## 2019-09-25 DIAGNOSIS — Z20828 Contact with and (suspected) exposure to other viral communicable diseases: Secondary | ICD-10-CM | POA: Insufficient documentation

## 2019-10-11 DIAGNOSIS — Z7901 Long term (current) use of anticoagulants: Secondary | ICD-10-CM | POA: Insufficient documentation

## 2019-11-13 DIAGNOSIS — M62830 Muscle spasm of back: Secondary | ICD-10-CM | POA: Insufficient documentation

## 2020-02-13 ENCOUNTER — Emergency Department
Admission: EM | Admit: 2020-02-13 | Discharge: 2020-02-13 | Disposition: A | Discharge status: Home / Self Care | Payer: Medicare Other | Attending: Emergency Medicine | Admitting: Emergency Medicine

## 2020-02-13 ENCOUNTER — Emergency Department: Payer: Medicare Other

## 2020-02-13 ENCOUNTER — Other Ambulatory Visit: Payer: Self-pay

## 2020-02-13 DIAGNOSIS — Z87891 Personal history of nicotine dependence: Secondary | ICD-10-CM | POA: Insufficient documentation

## 2020-02-13 DIAGNOSIS — Z955 Presence of coronary angioplasty implant and graft: Secondary | ICD-10-CM | POA: Diagnosis not present

## 2020-02-13 DIAGNOSIS — E119 Type 2 diabetes mellitus without complications: Secondary | ICD-10-CM | POA: Insufficient documentation

## 2020-02-13 DIAGNOSIS — Z79899 Other long term (current) drug therapy: Secondary | ICD-10-CM | POA: Insufficient documentation

## 2020-02-13 DIAGNOSIS — I2510 Atherosclerotic heart disease of native coronary artery without angina pectoris: Secondary | ICD-10-CM | POA: Insufficient documentation

## 2020-02-13 DIAGNOSIS — R002 Palpitations: Secondary | ICD-10-CM | POA: Diagnosis not present

## 2020-02-13 DIAGNOSIS — J449 Chronic obstructive pulmonary disease, unspecified: Secondary | ICD-10-CM | POA: Insufficient documentation

## 2020-02-13 DIAGNOSIS — I5032 Chronic diastolic (congestive) heart failure: Secondary | ICD-10-CM | POA: Insufficient documentation

## 2020-02-13 DIAGNOSIS — Z7901 Long term (current) use of anticoagulants: Secondary | ICD-10-CM | POA: Diagnosis not present

## 2020-02-13 DIAGNOSIS — R079 Chest pain, unspecified: Secondary | ICD-10-CM | POA: Diagnosis present

## 2020-02-13 LAB — CBC
HCT: 30.8 % — ABNORMAL LOW (ref 39.0–52.0)
Hemoglobin: 8.9 g/dL — ABNORMAL LOW (ref 13.0–17.0)
MCH: 25.7 pg — ABNORMAL LOW (ref 26.0–34.0)
MCHC: 28.9 g/dL — ABNORMAL LOW (ref 30.0–36.0)
MCV: 89 fL (ref 80.0–100.0)
Platelets: 326 10*3/uL (ref 150–400)
RBC: 3.46 MIL/uL — ABNORMAL LOW (ref 4.22–5.81)
RDW: 16.4 % — ABNORMAL HIGH (ref 11.5–15.5)
WBC: 6.7 10*3/uL (ref 4.0–10.5)
nRBC: 0 % (ref 0.0–0.2)

## 2020-02-13 LAB — TROPONIN I (HIGH SENSITIVITY)
Troponin I (High Sensitivity): 4 ng/L (ref <18)
Troponin I (High Sensitivity): 4 ng/L

## 2020-02-13 LAB — BASIC METABOLIC PANEL WITH GFR
Anion gap: 9 (ref 5–15)
BUN: 20 mg/dL (ref 8–23)
CO2: 27 mmol/L (ref 22–32)
Calcium: 8.9 mg/dL (ref 8.9–10.3)
Chloride: 101 mmol/L (ref 98–111)
Creatinine, Ser: 1.08 mg/dL (ref 0.61–1.24)
GFR calc Af Amer: >60 mL/min
GFR calc non Af Amer: >60 mL/min
Glucose, Bld: 129 mg/dL — ABNORMAL HIGH (ref 70–99)
Potassium: 4.5 mmol/L (ref 3.5–5.1)
Sodium: 137 mmol/L (ref 135–145)

## 2020-02-13 MED ORDER — SODIUM CHLORIDE 0.9% FLUSH: 3.0000 mL | Freq: Once | INTRAVENOUS | Status: DC

## 2020-02-13 MED ORDER — TRAMADOL HCL 50 MG PO TABS: 50.0000 mg | ORAL_TABLET | Freq: Four times a day (QID) | ORAL | 0 refills | Status: DC | PRN

## 2020-02-13 NOTE — ED Notes (Signed)
Rainbow was sent to lab. 

## 2020-02-13 NOTE — ED Notes (Signed)
Pt reports he woke up this am and his chest was tight and his heart was beating real fast. Pt reports he was concerned it was his a-fib. Pt reports hx of that and had a procedure but it came back in December. Pt reports he felt SOB and nauseated as well.

## 2020-02-13 NOTE — ED Triage Notes (Signed)
Pt comes into the ED via EMS from home with c/o chest tightness with SOB this morning took 1baby asa at home and was given additional aspirin and 2 SL nitro sprays and is now denies any chest discomfort. Pt is in NAD.[name]

## 2020-02-13 NOTE — ED Provider Notes (Signed)
[facility] Emergency Department Provider Note   ____________________________________________    I have reviewed the triage vital signs and the nursing notes.   HISTORY  Chief Complaint Chest Pain     HPI Aaron Ball is a 69 y.o. male who presents with complaints of palpitations and mild chest discomfort that started around 4 AM this morning while he was sleeping in his recliner, patient has chronic back pain and always sleeps in his recliner.  He reports he took his blood pressure medication which seemed to help and he rested until 8 AM but then felt a mild chest discomfort again and his son insisted that he come to the emergency department.  Currently feels well and has no complaints.  No palpitations.  No shortness of breath.  No calf pain or swelling.  No pleurisy.  Past Medical History:  Diagnosis Date  . A-fib ([place]) 04/2011   a. s/p ablation x 2 w/o evi of recurrence   . Chronic diastolic CHF (congestive heart failure) ([place])    a. echo 2012: EF >55%, nl RVSP  . COPD (chronic obstructive pulmonary disease) ([place])   . Coronary artery disease    a. s/p stent in RCA in 1997 w/ ISR 2003 & 05/2013 s/p DES to mid RCA 05/2013; b. cardiac cath 02/2014: nonobstructive CAD with patent stent in RCA with 40% ISR, no evi of pulm HTN, nl LVEDP, EF >65%   . Diabetes mellitus ([place])   . GERD (gastroesophageal reflux disease)   . Gout   . Hyperlipidemia   . Hypertension   . Morbid obesity ([place])   . Obesity hypoventilation syndrome ([place])   . OSA (obstructive sleep apnea)   . Wears hearing aid    right ear    Patient Active Problem List   Diagnosis Date Noted  . Atrial flutter ([place]) 09/05/2019  . Pulmonary edema 09/05/2019  . Morbid obesity ([place]) 09/05/2019  . Atrial tachycardia ([place]) 09/03/2019  . OSA (obstructive sleep apnea) 09/03/2019  . COPD (chronic obstructive pulmonary disease) ([place]) 09/03/2019  . Chronic back pain 09/03/2019  . Gout  09/03/2019  . Type 2 diabetes mellitus ([place]) 09/03/2019  . Allergic rhinitis 09/03/2019  . Chest pain, central 10/08/2015  . AKI (acute kidney injury) ([place]) 08/19/2015  . CAD (coronary artery disease) 06/28/2011  . Obesity 06/28/2011  . Hyperlipidemia 06/28/2011  . HTN (hypertension) 06/28/2011    Past Surgical History:  Procedure Laterality Date  . ATRIAL ABLATION SURGERY     A-Fib  . BARIATRIC SURGERY    . CARDIAC CATHETERIZATION     s/p stent to RCA.   [name] CARDIAC CATHETERIZATION  2008   Dr. Clayborn Bigness @ [facility] .  [name] CARDIOVERSION N/A 09/09/2019   Procedure: CARDIOVERSION;  Surgeon: Nelva Bush, MD;  Location: [facility] ORS;  Service: Cardiovascular;  Laterality: N/A;  . CHOLECYSTECTOMY  2012  . heart stint  2013  . HIP FRACTURE SURGERY    . STOMACH SURGERY     gastric sleeve  . TEE WITHOUT CARDIOVERSION N/A 09/09/2019   Procedure: TRANSESOPHAGEAL ECHOCARDIOGRAM (TEE);  Surgeon: Nelva Bush, MD;  Location: [facility] ORS;  Service: Cardiovascular;  Laterality: N/A;    Prior to Admission medications   Medication Sig Start Date End Date Taking? Authorizing Provider  acetaminophen (TYLENOL) 500 MG tablet Take 1,000 mg by mouth every 4 (four) hours as needed for pain.    [provider]  allopurinol (ZYLOPRIM) 300 MG tablet Take 300 mg by mouth daily. 06/26/19  [provider]  ALPRAZolam Prudy Feeler) 0.25 MG tablet Take 0.25 mg by mouth 2 (two) times daily as needed for sleep. 07/17/19 07/16/20  [provider]  apixaban (ELIQUIS) 5 MG TABS tablet Take 1 tablet (5 mg total) by mouth 2 (two) times daily. 09/12/19   Standley Brooking, MD  aspirin 81 MG EC tablet Take 81 mg by mouth daily.    [provider]  atorvastatin (LIPITOR) 40 MG tablet Take 1 tablet by mouth daily. 08/10/15   [provider]  colchicine 0.6 MG tablet Take 0.6 mg by mouth daily. 07/21/15   [provider]  furosemide (LASIX) 20 MG tablet Take 1 tablet (20 mg total)  by mouth 2 (two) times daily. 09/12/19   Standley Brooking, MD  gabapentin (NEURONTIN) 300 MG capsule Take 300 mg by mouth at bedtime.    [provider]  lisinopril (ZESTRIL) 20 MG tablet Take 20 mg by mouth 2 (two) times daily. 08/05/19   [provider]  loratadine (CLARITIN) 10 MG tablet Take 10 mg by mouth at bedtime. 03/01/17   [provider]  metoprolol succinate (TOPROL-XL) 25 MG 24 hr tablet Take 1 tablet (25 mg total) by mouth 2 (two) times daily. 09/12/19   Standley Brooking, MD  nitroGLYCERIN (NITROSTAT) 0.4 MG SL tablet Place 0.4 mg under the tongue every 5 (five) minutes as needed.  07/08/15   [provider]  tiZANidine (ZANAFLEX) 2 MG tablet Take 2 mg by mouth every 8 (eight) hours as needed. 07/17/19   [provider]  traMADol (ULTRAM) 50 MG tablet Take 1 tablet (50 mg total) by mouth every 6 (six) hours as needed. 02/13/20 02/12/21  Jene Every, MD  traZODone (DESYREL) 50 MG tablet Take 50 mg by mouth at bedtime. 08/15/19   [provider]     Allergies Erythromycin and Cyclobenzaprine  Family History  Problem Relation Age of Onset  . Heart disease Father   . Heart attack Father     Social History Social History   Tobacco Use  . Smoking status: Former Smoker    Packs/day: 1.00    Years: 30.00    Pack years: 30.00    Types: Cigarettes    Quit date: 06/28/2003    Years since quitting: 16.6  . Smokeless tobacco: Never Used  Substance Use Topics  . Alcohol use: No  . Drug use: No    Review of Systems  Constitutional: No fever/chills Eyes: No visual changes.  ENT: No sore throat. Cardiovascular: As above Respiratory: Denies shortness of breath. Gastrointestinal: No abdominal pain.  No nausea, no vomiting.   Genitourinary: Negative for dysuria. Musculoskeletal: Chronic back pain Skin: Negative for rash. Neurological: Negative for headaches    ____________________________________________   PHYSICAL  EXAM:  VITAL SIGNS: ED Triage Vitals  Enc Vitals Group     BP 02/13/20 0927 135/67     Pulse Rate 02/13/20 0927 62     Resp 02/13/20 0927 17     Temp 02/13/20 0930 98.3 F (36.8 C)     Temp Source 02/13/20 0927 Oral     SpO2 02/13/20 0927 100 %     Weight 02/13/20 0928 (!) 145.2 kg (320 lb)     Height 02/13/20 0928 1.676 m (5\' 6" )     Head Circumference --      Peak Flow --      Pain Score 02/13/20 0928 0     Pain Loc --  Pain Edu? --      Excl. in GC? --     Constitutional: Alert and oriented.  Nose: No congestion/rhinnorhea. Mouth/Throat: Mucous membranes are moist.    Cardiovascular: Normal rate, regular rhythm. Grossly normal heart sounds.  Good peripheral circulation. Respiratory: Normal respiratory effort.  No retractions. Lungs CTAB. Gastrointestinal: Soft and nontender. No distention.   Musculoskeletal: Mild edema bilaterally.  Warm and well perfused Neurologic:  Normal speech and language. No gross focal neurologic deficits are appreciated.  Skin:  Skin is warm, dry and intact. No rash noted. Psychiatric: Mood and affect are normal. Speech and behavior are normal.  ____________________________________________   LABS (all labs ordered are listed, but only abnormal results are displayed)  Labs Reviewed  BASIC METABOLIC PANEL - Abnormal; Notable for the following components:      Result Value   Glucose, Bld 129 (*)    All other components within normal limits  CBC - Abnormal; Notable for the following components:   RBC 3.46 (*)    Hemoglobin 8.9 (*)    HCT 30.8 (*)    MCH 25.7 (*)    MCHC 28.9 (*)    RDW 16.4 (*)    All other components within normal limits  TROPONIN I (HIGH SENSITIVITY)  TROPONIN I (HIGH SENSITIVITY)   ____________________________________________  EKG  ED ECG REPORT I, Jene Every, the attending physician, personally viewed and interpreted this ECG.  Date: 02/13/2020  Rhythm: normal sinus rhythm QRS Axis:  normal Intervals: normal ST/T Wave abnormalities: normal Narrative Interpretation: no evidence of acute ischemia  ____________________________________________  RADIOLOGY  Chest x-ray without overt edema, reviewed by me ____________________________________________   PROCEDURES  Procedure(s) performed: No  Procedures   Critical Care performed: No ____________________________________________   INITIAL IMPRESSION / ASSESSMENT AND PLAN / ED COURSE  Pertinent labs & imaging results that were available during my care of the patient were reviewed by me and considered in my medical decision making (see chart for details).  Patient well-appearing and in no acute distress, asymptomatic in the emergency department, no shortness of breath, no chest pain at this time.  Does have chronic lower extremity edema.  Episode of palpitations this morning he thought that he may be going back into atrial fibrillation, EKG is reassuring.  The patient is anticoagulated.  Initial EKG and troponin not consistent with ACS, delta troponin pending.  Patient remains asymptomatic, delta troponin reassuring, appropriate for discharge at this time with close follow-up with cardiology, strict return precautions discussed    ____________________________________________   FINAL CLINICAL IMPRESSION(S) / ED DIAGNOSES  Final diagnoses:  Palpitations        Note:  This document was prepared using Dragon voice recognition software and may include unintentional dictation errors.   Jene Every, MD 02/13/20 1235

## 2020-02-13 NOTE — ED Notes (Signed)
Pt verbalizes understanding of d/c instructions, medications and follow up 

## 2020-06-30 ENCOUNTER — Other Ambulatory Visit: Payer: Self-pay

## 2020-06-30 ENCOUNTER — Ambulatory Visit (INDEPENDENT_AMBULATORY_CARE_PROVIDER_SITE_OTHER): Payer: Medicare Other | Admitting: Podiatry

## 2020-06-30 DIAGNOSIS — L989 Disorder of the skin and subcutaneous tissue, unspecified: Secondary | ICD-10-CM | POA: Diagnosis not present

## 2020-06-30 DIAGNOSIS — M79675 Pain in left toe(s): Secondary | ICD-10-CM

## 2020-06-30 DIAGNOSIS — E0843 Diabetes mellitus due to underlying condition with diabetic autonomic (poly)neuropathy: Secondary | ICD-10-CM | POA: Diagnosis not present

## 2020-06-30 DIAGNOSIS — B351 Tinea unguium: Secondary | ICD-10-CM | POA: Diagnosis not present

## 2020-06-30 DIAGNOSIS — M79674 Pain in right toe(s): Secondary | ICD-10-CM | POA: Diagnosis not present

## 2020-06-30 NOTE — Progress Notes (Signed)
   SUBJECTIVE Patient with a history of diabetes mellitus presents to office today complaining of elongated, thickened nails that cause pain while ambulating in shoes.  He is unable to trim his own nails. Patient is here for further evaluation and treatment.   Past Medical History:  Diagnosis Date  . A-fib ([facility]) 04/2011   a. s/p ablation x 2 w/o evi of recurrence   . Chronic diastolic CHF (congestive heart failure) ([facility])    a. echo 2012: EF >55%, nl RVSP  . COPD (chronic obstructive pulmonary disease) ([facility])   . Coronary artery disease    a. s/p stent in RCA in 1997 w/ ISR 2003 & 05/2013 s/p DES to mid RCA 05/2013; b. cardiac cath 02/2014: nonobstructive CAD with patent stent in RCA with 40% ISR, no evi of pulm HTN, nl LVEDP, EF >65%   . Diabetes mellitus ([facility])   . GERD (gastroesophageal reflux disease)   . Gout   . Hyperlipidemia   . Hypertension   . Morbid obesity ([facility])   . Obesity hypoventilation syndrome ([facility])   . OSA (obstructive sleep apnea)   . Wears hearing aid    right ear    OBJECTIVE General Patient is awake, alert, and oriented x 3 and in no acute distress. Derm Skin is dry and supple bilateral. Negative open lesions or macerations. Remaining integument unremarkable. Nails are tender, long, thickened and dystrophic with subungual debris, consistent with onychomycosis, 1-5 bilateral. No signs of infection noted.  Also noted are some hyperkeratotic preulcerative callus tissue noted to the distal tips of the toes bilateral Vasc  DP and PT pedal pulses palpable bilaterally. Temperature gradient within normal limits.  Neuro Epicritic and protective threshold sensation diminished bilaterally.  Musculoskeletal Exam No symptomatic pedal deformities noted bilateral. Muscular strength within normal limits.  ASSESSMENT 1. Diabetes Mellitus w/ peripheral neuropathy 2. Onychomycosis of nail due to dermatophyte bilateral 3. Pain in foot bilateral 4.  Preulcerative calluses bilateral  feet  PLAN OF CARE 1. Patient evaluated today. 2. Instructed to maintain good pedal hygiene and foot care. Stressed importance of controlling blood sugar.  3. Mechanical debridement of nails 1-5 bilaterally performed using a nail nipper. Filed with dremel without incident.  4.  Excisional debridement of the hyperkeratotic callus tissue was performed using a tissue nipper without incident or bleeding  5.  Return to clinic in 3 mos.     Felecia Shelling, DPM [facility]  Dr. Felecia Shelling, DPM    [address]                                        [place], [place] [address]                Office [phone]  Fax [phone]

## 2020-08-04 ENCOUNTER — Inpatient Hospital Stay
Admission: EM | Admit: 2020-08-04 | Discharge: 2020-08-08 | DRG: 291 | Disposition: A | Discharge status: Home Health | Payer: Medicare Other | Attending: Family Medicine | Admitting: Family Medicine

## 2020-08-04 ENCOUNTER — Emergency Department: Payer: Medicare Other

## 2020-08-04 ENCOUNTER — Other Ambulatory Visit: Payer: Self-pay

## 2020-08-04 ENCOUNTER — Encounter: Payer: Self-pay | Admitting: Emergency Medicine

## 2020-08-04 ENCOUNTER — Inpatient Hospital Stay
Admit: 2020-08-04 | Discharge: 2020-08-04 | Disposition: A | Discharge status: Home / Self Care | Payer: Medicare Other | Attending: Internal Medicine | Admitting: Internal Medicine

## 2020-08-04 DIAGNOSIS — R040 Epistaxis: Secondary | ICD-10-CM | POA: Diagnosis present

## 2020-08-04 DIAGNOSIS — E119 Type 2 diabetes mellitus without complications: Secondary | ICD-10-CM | POA: Diagnosis not present

## 2020-08-04 DIAGNOSIS — I5043 Acute on chronic combined systolic (congestive) and diastolic (congestive) heart failure: Secondary | ICD-10-CM

## 2020-08-04 DIAGNOSIS — I110 Hypertensive heart disease with heart failure: Principal | ICD-10-CM | POA: Diagnosis present

## 2020-08-04 DIAGNOSIS — Z7982 Long term (current) use of aspirin: Secondary | ICD-10-CM

## 2020-08-04 DIAGNOSIS — Z87891 Personal history of nicotine dependence: Secondary | ICD-10-CM | POA: Diagnosis not present

## 2020-08-04 DIAGNOSIS — I10 Essential (primary) hypertension: Secondary | ICD-10-CM | POA: Diagnosis present

## 2020-08-04 DIAGNOSIS — D649 Anemia, unspecified: Secondary | ICD-10-CM | POA: Diagnosis present

## 2020-08-04 DIAGNOSIS — I2510 Atherosclerotic heart disease of native coronary artery without angina pectoris: Secondary | ICD-10-CM | POA: Diagnosis present

## 2020-08-04 DIAGNOSIS — J9621 Acute and chronic respiratory failure with hypoxia: Secondary | ICD-10-CM | POA: Diagnosis present

## 2020-08-04 DIAGNOSIS — E785 Hyperlipidemia, unspecified: Secondary | ICD-10-CM | POA: Diagnosis present

## 2020-08-04 DIAGNOSIS — Z7901 Long term (current) use of anticoagulants: Secondary | ICD-10-CM | POA: Diagnosis not present

## 2020-08-04 DIAGNOSIS — E662 Morbid (severe) obesity with alveolar hypoventilation: Secondary | ICD-10-CM | POA: Diagnosis present

## 2020-08-04 DIAGNOSIS — R0600 Dyspnea, unspecified: Secondary | ICD-10-CM

## 2020-08-04 DIAGNOSIS — G4733 Obstructive sleep apnea (adult) (pediatric): Secondary | ICD-10-CM | POA: Diagnosis present

## 2020-08-04 DIAGNOSIS — M109 Gout, unspecified: Secondary | ICD-10-CM | POA: Diagnosis present

## 2020-08-04 DIAGNOSIS — Z955 Presence of coronary angioplasty implant and graft: Secondary | ICD-10-CM

## 2020-08-04 DIAGNOSIS — Z9981 Dependence on supplemental oxygen: Secondary | ICD-10-CM | POA: Diagnosis not present

## 2020-08-04 DIAGNOSIS — Z20822 Contact with and (suspected) exposure to covid-19: Secondary | ICD-10-CM | POA: Diagnosis present

## 2020-08-04 DIAGNOSIS — K219 Gastro-esophageal reflux disease without esophagitis: Secondary | ICD-10-CM | POA: Diagnosis present

## 2020-08-04 DIAGNOSIS — I482 Chronic atrial fibrillation, unspecified: Secondary | ICD-10-CM | POA: Diagnosis present

## 2020-08-04 DIAGNOSIS — D509 Iron deficiency anemia, unspecified: Secondary | ICD-10-CM | POA: Diagnosis present

## 2020-08-04 DIAGNOSIS — I5033 Acute on chronic diastolic (congestive) heart failure: Secondary | ICD-10-CM | POA: Diagnosis present

## 2020-08-04 DIAGNOSIS — J449 Chronic obstructive pulmonary disease, unspecified: Secondary | ICD-10-CM | POA: Diagnosis present

## 2020-08-04 DIAGNOSIS — J9622 Acute and chronic respiratory failure with hypercapnia: Secondary | ICD-10-CM

## 2020-08-04 DIAGNOSIS — Z6841 Body Mass Index (BMI) 40.0 and over, adult: Secondary | ICD-10-CM | POA: Diagnosis not present

## 2020-08-04 DIAGNOSIS — J432 Centrilobular emphysema: Secondary | ICD-10-CM

## 2020-08-04 LAB — RESP PANEL BY RT-PCR (FLU A&B, COVID) ARPGX2
Influenza A by PCR: NEGATIVE
Influenza B by PCR: NEGATIVE
SARS Coronavirus 2 by RT PCR: NEGATIVE

## 2020-08-04 LAB — CBC
HCT: 26.7 % — ABNORMAL LOW (ref 39.0–52.0)
Hemoglobin: 7.1 g/dL — ABNORMAL LOW (ref 13.0–17.0)
MCH: 22.5 pg — ABNORMAL LOW (ref 26.0–34.0)
MCHC: 26.6 g/dL — ABNORMAL LOW (ref 30.0–36.0)
MCV: 84.8 fL (ref 80.0–100.0)
Platelets: 324 10*3/uL (ref 150–400)
RBC: 3.15 MIL/uL — ABNORMAL LOW (ref 4.22–5.81)
RDW: 18.4 % — ABNORMAL HIGH (ref 11.5–15.5)
WBC: 9.9 10*3/uL (ref 4.0–10.5)
nRBC: 0 % (ref 0.0–0.2)

## 2020-08-04 LAB — BRAIN NATRIURETIC PEPTIDE: B Natriuretic Peptide: 301.5 pg/mL — ABNORMAL HIGH (ref 0.0–100.0)

## 2020-08-04 LAB — COMPREHENSIVE METABOLIC PANEL
ALT: 9 U/L (ref 0–44)
AST: 13 U/L — ABNORMAL LOW (ref 15–41)
Albumin: 3.6 g/dL (ref 3.5–5.0)
Alkaline Phosphatase: 113 U/L (ref 38–126)
Anion gap: 9 (ref 5–15)
BUN: 25 mg/dL — ABNORMAL HIGH (ref 8–23)
CO2: 31 mmol/L (ref 22–32)
Calcium: 8.8 mg/dL — ABNORMAL LOW (ref 8.9–10.3)
Chloride: 98 mmol/L (ref 98–111)
Creatinine, Ser: 1.11 mg/dL (ref 0.61–1.24)
GFR, Estimated: >60 mL/min (ref >60)
Glucose, Bld: 122 mg/dL — ABNORMAL HIGH (ref 70–99)
Potassium: 4.6 mmol/L (ref 3.5–5.1)
Sodium: 138 mmol/L (ref 135–145)
Total Bilirubin: 0.6 mg/dL (ref 0.3–1.2)
Total Protein: 7.3 g/dL (ref 6.5–8.1)

## 2020-08-04 LAB — CBC WITH DIFFERENTIAL/PLATELET
Abs Immature Granulocytes: 0.04 10*3/uL (ref 0.00–0.07)
Basophils Absolute: 0.1 10*3/uL (ref 0.0–0.1)
Basophils Relative: 1 %
Eosinophils Absolute: 0.2 10*3/uL (ref 0.0–0.5)
Eosinophils Relative: 3 %
HCT: 23.6 % — ABNORMAL LOW (ref 39.0–52.0)
Hemoglobin: 6.1 g/dL — ABNORMAL LOW (ref 13.0–17.0)
Immature Granulocytes: 1 %
Lymphocytes Relative: 10 %
Lymphs Abs: 0.7 10*3/uL (ref 0.7–4.0)
MCH: 21.8 pg — ABNORMAL LOW (ref 26.0–34.0)
MCHC: 25.8 g/dL — ABNORMAL LOW (ref 30.0–36.0)
MCV: 84.3 fL (ref 80.0–100.0)
Monocytes Absolute: 0.6 10*3/uL (ref 0.1–1.0)
Monocytes Relative: 8 %
Neutro Abs: 5.8 10*3/uL (ref 1.7–7.7)
Neutrophils Relative %: 77 %
Platelets: 330 10*3/uL (ref 150–400)
RBC: 2.8 MIL/uL — ABNORMAL LOW (ref 4.22–5.81)
RDW: 18.9 % — ABNORMAL HIGH (ref 11.5–15.5)
WBC: 7.5 10*3/uL (ref 4.0–10.5)
nRBC: 0.3 % — ABNORMAL HIGH (ref 0.0–0.2)

## 2020-08-04 LAB — FERRITIN: Ferritin: 7 ng/mL — ABNORMAL LOW (ref 24–336)

## 2020-08-04 LAB — BLOOD GAS, VENOUS
Acid-Base Excess: 5.9 mmol/L — ABNORMAL HIGH (ref 0.0–2.0)
Bicarbonate: 32.7 mmol/L — ABNORMAL HIGH (ref 20.0–28.0)
FIO2: 0.21
O2 Saturation: 48.1 %
Patient temperature: 37
pCO2, Ven: 65 mmHg — ABNORMAL HIGH (ref 44.0–60.0)
pH, Ven: 7.31 (ref 7.250–7.430)
pO2, Ven: <31 mmHg — CL (ref 32.0–45.0)

## 2020-08-04 LAB — RETICULOCYTES
Immature Retic Fract: 27.4 % — ABNORMAL HIGH (ref 2.3–15.9)
RBC.: 2.74 MIL/uL — ABNORMAL LOW (ref 4.22–5.81)
Retic Count, Absolute: 80.6 10*3/uL (ref 19.0–186.0)
Retic Ct Pct: 2.9 % (ref 0.4–3.1)

## 2020-08-04 LAB — GLUCOSE, CAPILLARY
Glucose-Capillary: 119 mg/dL — ABNORMAL HIGH (ref 70–99)
Glucose-Capillary: 137 mg/dL — ABNORMAL HIGH (ref 70–99)

## 2020-08-04 LAB — IRON AND TIBC
Iron: 24 ug/dL — ABNORMAL LOW (ref 45–182)
Saturation Ratios: 6 % — ABNORMAL LOW (ref 17.9–39.5)
TIBC: 414 ug/dL (ref 250–450)
UIBC: 390 ug/dL

## 2020-08-04 LAB — PROTIME-INR
INR: 1.9 — ABNORMAL HIGH (ref 0.8–1.2)
Prothrombin Time: 21.5 seconds — ABNORMAL HIGH (ref 11.4–15.2)

## 2020-08-04 LAB — ABO/RH: ABO/RH(D): O POS

## 2020-08-04 LAB — FOLATE: Folate: 8.8 ng/mL (ref >5.9)

## 2020-08-04 LAB — PREPARE RBC (CROSSMATCH)

## 2020-08-04 LAB — VITAMIN B12: Vitamin B-12: 212 pg/mL (ref 180–914)

## 2020-08-04 LAB — TROPONIN I (HIGH SENSITIVITY): Troponin I (High Sensitivity): 4 ng/L (ref <18)

## 2020-08-04 MED ORDER — ALBUTEROL SULFATE (2.5 MG/3ML) 0.083% IN NEBU: 2.5000 mg | INHALATION_SOLUTION | RESPIRATORY_TRACT | Status: DC | PRN

## 2020-08-04 MED ORDER — FUROSEMIDE 10 MG/ML IJ SOLN: 40.0000 mg | Freq: Once | INTRAMUSCULAR | Status: DC

## 2020-08-04 MED ORDER — ALLOPURINOL 100 MG PO TABS
100.0000 mg | ORAL_TABLET | Freq: Every day | ORAL | Status: DC
  Administered 2020-08-05 – 2020-08-08 (×4): 100 mg via ORAL
  Filled 2020-08-04 (×4): qty 1

## 2020-08-04 MED ORDER — ACETAMINOPHEN 325 MG PO TABS: 650.0000 mg | ORAL_TABLET | Freq: Four times a day (QID) | ORAL | Status: DC | PRN

## 2020-08-04 MED ORDER — PERFLUTREN LIPID MICROSPHERE
1.0000 mL | INTRAVENOUS | Status: AC | PRN
  Administered 2020-08-04: 3 mL via INTRAVENOUS
  Filled 2020-08-04: qty 10

## 2020-08-04 MED ORDER — IPRATROPIUM-ALBUTEROL 0.5-2.5 (3) MG/3ML IN SOLN
3.0000 mL | Freq: Four times a day (QID) | RESPIRATORY_TRACT | Status: DC
  Administered 2020-08-04 (×3): 3 mL via RESPIRATORY_TRACT
  Filled 2020-08-04 (×3): qty 3

## 2020-08-04 MED ORDER — FUROSEMIDE 10 MG/ML IJ SOLN
40.0000 mg | Freq: Two times a day (BID) | INTRAMUSCULAR | Status: DC
  Administered 2020-08-04 – 2020-08-06 (×5): 40 mg via INTRAVENOUS
  Filled 2020-08-04 (×5): qty 4

## 2020-08-04 MED ORDER — INSULIN ASPART 100 UNIT/ML ~~LOC~~ SOLN: 0.0000 [IU] | Freq: Every day | SUBCUTANEOUS | Status: DC

## 2020-08-04 MED ORDER — TRAMADOL HCL 50 MG PO TABS
50.0000 mg | ORAL_TABLET | Freq: Three times a day (TID) | ORAL | Status: DC | PRN
  Administered 2020-08-05: 50 mg via ORAL
  Filled 2020-08-04: qty 1

## 2020-08-04 MED ORDER — SODIUM CHLORIDE 0.9 % IV SOLN
510.0000 mg | Freq: Once | INTRAVENOUS | Status: AC
  Administered 2020-08-04: 510 mg via INTRAVENOUS
  Filled 2020-08-04: qty 17

## 2020-08-04 MED ORDER — FERROUS SULFATE 325 (65 FE) MG PO TABS
325.0000 mg | ORAL_TABLET | Freq: Two times a day (BID) | ORAL | Status: DC
  Administered 2020-08-04 – 2020-08-08 (×8): 325 mg via ORAL
  Filled 2020-08-04 (×9): qty 1

## 2020-08-04 MED ORDER — IPRATROPIUM-ALBUTEROL 0.5-2.5 (3) MG/3ML IN SOLN
3.0000 mL | Freq: Three times a day (TID) | RESPIRATORY_TRACT | Status: DC
  Administered 2020-08-05 (×2): 3 mL via RESPIRATORY_TRACT
  Filled 2020-08-04 (×2): qty 3

## 2020-08-04 MED ORDER — HYDRALAZINE HCL 20 MG/ML IJ SOLN: 5.0000 mg | INTRAMUSCULAR | Status: DC | PRN

## 2020-08-04 MED ORDER — ATORVASTATIN CALCIUM 20 MG PO TABS
40.0000 mg | ORAL_TABLET | Freq: Every evening | ORAL | Status: DC
  Administered 2020-08-05 – 2020-08-07 (×3): 40 mg via ORAL
  Filled 2020-08-04 (×3): qty 2

## 2020-08-04 MED ORDER — SODIUM CHLORIDE 0.9 % IV SOLN
10.0000 mL/h | Freq: Once | INTRAVENOUS | Status: AC
  Administered 2020-08-04: 10 mL/h via INTRAVENOUS

## 2020-08-04 MED ORDER — TRAZODONE HCL 50 MG PO TABS
50.0000 mg | ORAL_TABLET | Freq: Every evening | ORAL | Status: DC | PRN
  Administered 2020-08-04: 50 mg via ORAL
  Filled 2020-08-04: qty 1

## 2020-08-04 MED ORDER — DM-GUAIFENESIN ER 30-600 MG PO TB12: 1.0000 | ORAL_TABLET | Freq: Two times a day (BID) | ORAL | Status: DC | PRN

## 2020-08-04 MED ORDER — PANTOPRAZOLE SODIUM 40 MG PO TBEC
40.0000 mg | DELAYED_RELEASE_TABLET | Freq: Two times a day (BID) | ORAL | Status: DC
  Administered 2020-08-04 – 2020-08-08 (×9): 40 mg via ORAL
  Filled 2020-08-04 (×9): qty 1

## 2020-08-04 MED ORDER — ALLOPURINOL 300 MG PO TABS
300.0000 mg | ORAL_TABLET | Freq: Every day | ORAL | Status: DC
  Administered 2020-08-05 – 2020-08-08 (×4): 300 mg via ORAL
  Filled 2020-08-04 (×4): qty 1

## 2020-08-04 MED ORDER — ONDANSETRON HCL 4 MG/2ML IJ SOLN: 4.0000 mg | Freq: Three times a day (TID) | INTRAMUSCULAR | Status: DC | PRN

## 2020-08-04 MED ORDER — IPRATROPIUM-ALBUTEROL 0.5-2.5 (3) MG/3ML IN SOLN: 3.0000 mL | Freq: Once | RESPIRATORY_TRACT | Status: DC

## 2020-08-04 MED ORDER — SODIUM CHLORIDE 0.9% FLUSH
3.0000 mL | Freq: Two times a day (BID) | INTRAVENOUS | Status: DC
  Administered 2020-08-04 – 2020-08-08 (×8): 3 mL via INTRAVENOUS

## 2020-08-04 MED ORDER — NITROGLYCERIN 0.4 MG SL SUBL: 0.4000 mg | SUBLINGUAL_TABLET | SUBLINGUAL | Status: DC | PRN

## 2020-08-04 MED ORDER — SODIUM CHLORIDE 0.9% FLUSH: 3.0000 mL | INTRAVENOUS | Status: DC | PRN

## 2020-08-04 MED ORDER — LISINOPRIL 10 MG PO TABS
10.0000 mg | ORAL_TABLET | Freq: Every day | ORAL | Status: DC
  Administered 2020-08-04 – 2020-08-06 (×3): 10 mg via ORAL
  Filled 2020-08-04 (×3): qty 1

## 2020-08-04 MED ORDER — COLCHICINE 0.6 MG PO TABS: 0.6000 mg | ORAL_TABLET | Freq: Every day | ORAL | Status: DC | PRN

## 2020-08-04 MED ORDER — INSULIN ASPART 100 UNIT/ML ~~LOC~~ SOLN
0.0000 [IU] | Freq: Three times a day (TID) | SUBCUTANEOUS | Status: DC
  Administered 2020-08-05: 1 [IU] via SUBCUTANEOUS
  Administered 2020-08-08: 2 [IU] via SUBCUTANEOUS
  Filled 2020-08-04 (×2): qty 1

## 2020-08-04 MED ORDER — GABAPENTIN 300 MG PO CAPS
900.0000 mg | ORAL_CAPSULE | Freq: Every day | ORAL | Status: DC
  Administered 2020-08-04 – 2020-08-07 (×4): 900 mg via ORAL
  Filled 2020-08-04 (×4): qty 3

## 2020-08-04 MED ORDER — SOTALOL HCL 80 MG PO TABS
160.0000 mg | ORAL_TABLET | Freq: Two times a day (BID) | ORAL | Status: DC
  Administered 2020-08-04 – 2020-08-07 (×7): 160 mg via ORAL
  Filled 2020-08-04 (×9): qty 2

## 2020-08-04 MED ORDER — ALPRAZOLAM 0.25 MG PO TABS
0.2500 mg | ORAL_TABLET | Freq: Two times a day (BID) | ORAL | Status: DC | PRN
  Administered 2020-08-05 – 2020-08-07 (×3): 0.25 mg via ORAL
  Filled 2020-08-04 (×3): qty 1

## 2020-08-04 MED ORDER — SODIUM CHLORIDE 0.9 % IV SOLN: 250.0000 mL | INTRAVENOUS | Status: DC | PRN

## 2020-08-04 NOTE — ED Triage Notes (Signed)
Pt here via EMS from home where he lives alone with c/o increasing shob over the past 2 weeks. Pt is on 2L chronic O2 at home, sats 89% upon ems arrival, was increased to 4L en route to ED, sats 95% at this time. BLE pitting edema noted. Oriented to room and call bell.

## 2020-08-04 NOTE — ED Notes (Signed)
Dr Roxan Hockey made aware of VBG results.

## 2020-08-04 NOTE — ED Notes (Signed)
Attempted to call report with no answer - will retry in a few minutes

## 2020-08-04 NOTE — ED Notes (Signed)
Spoke with pharmacy regarding feraheme inf. - will retime for after blood transfusion

## 2020-08-04 NOTE — ED Provider Notes (Addendum)
[facility] Emergency Department Provider Note    First MD Initiated Contact with Patient 08/04/20 [phone]     (approximate)  I have reviewed the triage vital signs and the nursing notes.   HISTORY  Chief Complaint Shortness of Breath    HPI Aaron Ball is a 69 y.o. male below listed past medical history on 2 L nasal cannula at home presents to the ER via EMS for 2 weeks of worsening shortness of breath orthopnea and fatigue.  He is denying any pain.  Denies any measured fevers or temperatures.  No cough.  Does feel like he is more swollen than usual.  Denies any melena hematochezia.    Past Medical History:  Diagnosis Date  . A-fib (HCC) 04/2011   a. s/p ablation x 2 w/o evi of recurrence   . Chronic diastolic CHF (congestive heart failure) (HCC)    a. echo 2012: EF >55%, nl RVSP  . COPD (chronic obstructive pulmonary disease) (HCC)   . Coronary artery disease    a. s/p stent in RCA in 1997 w/ ISR 2003 & 05/2013 s/p DES to mid RCA 05/2013; b. cardiac cath 02/2014: nonobstructive CAD with patent stent in RCA with 40% ISR, no evi of pulm HTN, nl LVEDP, EF >65%   . Diabetes mellitus (HCC)   . GERD (gastroesophageal reflux disease)   . Gout   . Hyperlipidemia   . Hypertension   . Morbid obesity (HCC)   . Obesity hypoventilation syndrome (HCC)   . OSA (obstructive sleep apnea)   . Wears hearing aid    right ear   Family History  Problem Relation Age of Onset  . Heart disease Father   . Heart attack Father    Past Surgical History:  Procedure Laterality Date  . ATRIAL ABLATION SURGERY     A-Fib  . BARIATRIC SURGERY    . CARDIAC CATHETERIZATION     s/p stent to RCA.   [name] CARDIAC CATHETERIZATION  2008   Dr. Juliann Pares @ [facility] .  [name] CARDIOVERSION N/A 09/09/2019   Procedure: CARDIOVERSION;  Surgeon: Yvonne Kendall, MD;  Location: [facility] ORS;  Service: Cardiovascular;  Laterality: N/A;  . CHOLECYSTECTOMY  2012  . heart stint  2013  . HIP FRACTURE  SURGERY    . STOMACH SURGERY     gastric sleeve  . TEE WITHOUT CARDIOVERSION N/A 09/09/2019   Procedure: TRANSESOPHAGEAL ECHOCARDIOGRAM (TEE);  Surgeon: Yvonne Kendall, MD;  Location: [facility] ORS;  Service: Cardiovascular;  Laterality: N/A;   Patient Active Problem List   Diagnosis Date Noted  . Atrial flutter (HCC) 09/05/2019  . Pulmonary edema 09/05/2019  . Morbid obesity (HCC) 09/05/2019  . Atrial tachycardia (HCC) 09/03/2019  . OSA (obstructive sleep apnea) 09/03/2019  . COPD (chronic obstructive pulmonary disease) (HCC) 09/03/2019  . Chronic back pain 09/03/2019  . Gout 09/03/2019  . Type 2 diabetes mellitus (HCC) 09/03/2019  . Allergic rhinitis 09/03/2019  . Chest pain, central 10/08/2015  . AKI (acute kidney injury) (HCC) 08/19/2015  . CAD (coronary artery disease) 06/28/2011  . Obesity 06/28/2011  . Hyperlipidemia 06/28/2011  . HTN (hypertension) 06/28/2011      Prior to Admission medications   Medication Sig Start Date End Date Taking? Authorizing Provider  acetaminophen (TYLENOL) 500 MG tablet Take 1,000 mg by mouth every 4 (four) hours as needed for pain.    [provider]  allopurinol (ZYLOPRIM) 300 MG tablet Take 300 mg by mouth daily. 06/26/19   [provider]  apixaban (ELIQUIS) 5 MG TABS tablet Take 1 tablet (5 mg total) by mouth 2 (two) times daily. 09/12/19   Standley Brooking, MD  aspirin 81 MG EC tablet Take 81 mg by mouth daily.    [provider]  atorvastatin (LIPITOR) 40 MG tablet Take 1 tablet by mouth daily. 08/10/15   [provider]  colchicine 0.6 MG tablet Take 0.6 mg by mouth daily. 07/21/15   [provider]  furosemide (LASIX) 20 MG tablet Take 1 tablet (20 mg total) by mouth 2 (two) times daily. 09/12/19   Standley Brooking, MD  gabapentin (NEURONTIN) 300 MG capsule Take 300 mg by mouth at bedtime.    [provider]  lisinopril (ZESTRIL) 20 MG tablet Take 20 mg by mouth 2 (two) times  daily. 08/05/19   [provider]  loratadine (CLARITIN) 10 MG tablet Take 10 mg by mouth at bedtime. 03/01/17   [provider]  metoprolol succinate (TOPROL-XL) 25 MG 24 hr tablet Take 1 tablet (25 mg total) by mouth 2 (two) times daily. 09/12/19   Standley Brooking, MD  nitroGLYCERIN (NITROSTAT) 0.4 MG SL tablet Place 0.4 mg under the tongue every 5 (five) minutes as needed.  07/08/15   [provider]  tiZANidine (ZANAFLEX) 2 MG tablet Take 2 mg by mouth every 8 (eight) hours as needed. 07/17/19   [provider]  traMADol (ULTRAM) 50 MG tablet Take 1 tablet (50 mg total) by mouth every 6 (six) hours as needed. 02/13/20 02/12/21  Jene Every, MD  traZODone (DESYREL) 50 MG tablet Take 50 mg by mouth at bedtime. 08/15/19   [provider]    Allergies Erythromycin and Cyclobenzaprine    Social History Social History   Tobacco Use  . Smoking status: Former Smoker    Packs/day: 1.00    Years: 30.00    Pack years: 30.00    Types: Cigarettes    Quit date: 06/28/2003    Years since quitting: 17.1  . Smokeless tobacco: Never Used  Substance Use Topics  . Alcohol use: No  . Drug use: No    Review of Systems Patient denies headaches, rhinorrhea, blurry vision, numbness, shortness of breath, chest pain, edema, cough, abdominal pain, nausea, vomiting, diarrhea, dysuria, fevers, rashes or hallucinations unless otherwise stated above in HPI. ____________________________________________   PHYSICAL EXAM:  VITAL SIGNS: Vitals:   08/04/20 0915 08/04/20 0941  BP:  (!) 143/53  Pulse: (!) 56 (!) 56  Resp: (!) 21 (!) 22  Temp:  97.8 F (36.6 C)  SpO2: 100% 99%    Constitutional: Alert and oriented. pale Eyes: Conjunctivae are normal.  Head: Atraumatic. Nose: No congestion/rhinnorhea. Mouth/Throat: Mucous membranes are moist.   Neck: No stridor. Painless ROM.  Cardiovascular: Normal rate, regular rhythm. Grossly normal heart sounds.  Good  peripheral circulation. Respiratory: mild tachypnea speaking in short phrases.  Gastrointestinal: Soft and nontender. No distention. No abdominal bruits. No CVA tenderness. Genitourinary:  Guaiac neg, light brown stool Musculoskeletal: No lower extremity tenderness 3+ BLE edema.  No joint effusions. Neurologic:  Normal speech and language. No gross focal neurologic deficits are appreciated. No facial droop Skin:  Skin is warm, dry and intact. No rash noted. Psychiatric: Mood and affect are normal. Speech and behavior are normal.  ____________________________________________   LABS (all labs ordered are listed, but only abnormal results are displayed)  Results for orders placed or performed during the hospital encounter of 08/04/20 (from the past 24 hour(s))  CBC with Differential     Status: Abnormal   Collection Time: 08/04/20  8:44 AM  Result Value Ref Range   WBC 7.5 4.0 - 10.5 K/uL   RBC 2.80 (L) 4.22 - 5.81 MIL/uL   Hemoglobin 6.1 (L) 13.0 - 17.0 g/dL   HCT 16.1 (L) 39 - 52 %   MCV 84.3 80.0 - 100.0 fL   MCH 21.8 (L) 26.0 - 34.0 pg   MCHC 25.8 (L) 30.0 - 36.0 g/dL   RDW 09.6 (H) 04.5 - 40.9 %   Platelets 330 150 - 400 K/uL   nRBC 0.3 (H) 0.0 - 0.2 %   Neutrophils Relative % 77 %   Neutro Abs 5.8 1.7 - 7.7 K/uL   Lymphocytes Relative 10 %   Lymphs Abs 0.7 0.7 - 4.0 K/uL   Monocytes Relative 8 %   Monocytes Absolute 0.6 0.1 - 1.0 K/uL   Eosinophils Relative 3 %   Eosinophils Absolute 0.2 0.0 - 0.5 K/uL   Basophils Relative 1 %   Basophils Absolute 0.1 0.0 - 0.1 K/uL   Immature Granulocytes 1 %   Abs Immature Granulocytes 0.04 0.00 - 0.07 K/uL  Comprehensive metabolic panel     Status: Abnormal   Collection Time: 08/04/20  8:44 AM  Result Value Ref Range   Sodium 138 135 - 145 mmol/L   Potassium 4.6 3.5 - 5.1 mmol/L   Chloride 98 98 - 111 mmol/L   CO2 31 22 - 32 mmol/L   Glucose, Bld 122 (H) 70 - 99 mg/dL   BUN 25 (H) 8 - 23 mg/dL   Creatinine, Ser 8.11 0.61 - 1.24  mg/dL   Calcium 8.8 (L) 8.9 - 10.3 mg/dL   Total Protein 7.3 6.5 - 8.1 g/dL   Albumin 3.6 3.5 - 5.0 g/dL   AST 13 (L) 15 - 41 U/L   ALT 9 0 - 44 U/L   Alkaline Phosphatase 113 38 - 126 U/L   Total Bilirubin 0.6 0.3 - 1.2 mg/dL   GFR, Estimated >91 >47 mL/min   Anion gap 9 5 - 15  Protime-INR     Status: Abnormal   Collection Time: 08/04/20  8:44 AM  Result Value Ref Range   Prothrombin Time 21.5 (H) 11.4 - 15.2 seconds   INR 1.9 (H) 0.8 - 1.2  Troponin I (High Sensitivity)     Status: None   Collection Time: 08/04/20  8:44 AM  Result Value Ref Range   Troponin I (High Sensitivity) 4 <18 ng/L  Blood gas, venous     Status: Abnormal   Collection Time: 08/04/20  8:44 AM  Result Value Ref Range   FIO2 0.21    pH, Ven 7.31 7.25 - 7.43   pCO2, Ven 65 (H) 44 - 60 mmHg   pO2, Ven <31.0 (LL) 32 - 45 mmHg   Bicarbonate 32.7 (H) 20.0 - 28.0 mmol/L   Acid-Base Excess 5.9 (H) 0.0 - 2.0 mmol/L   O2 Saturation 48.1 %   Patient temperature 37.0    Collection site VENOUS    Sample type VENOUS   Type and screen [facility]     Status: None (Preliminary result)   Collection Time: 08/04/20  8:45 AM  Result Value Ref Range   ABO/RH(D) PENDING    Antibody Screen PENDING    Sample Expiration      08/07/2020,2359 Performed at [facility], [address] [place], [place] [address]   Prepare RBC (crossmatch)  Status: None (Preliminary result)   Collection Time: 08/04/20  9:22 AM  Result Value Ref Range   Order Confirmation PENDING    ____________________________________________  EKG My review and personal interpretation at Time: 8:27   Indication: sob  Rate: 60  Rhythm: sinus Axis: normal Other: normal intervals, no stemi ____________________________________________  RADIOLOGY  I personally reviewed all radiographic images ordered to evaluate for the above acute complaints and reviewed radiology reports and findings.  These findings were personally  discussed with the patient.  Please see medical record for radiology report.  ____________________________________________   PROCEDURES  Procedure(s) performed:  .Critical Care Performed by: Willy Eddyobinson, Coby Antrobus, MD Authorized by: Willy Eddyobinson, Elita Dame, MD   Critical care provider statement:    Critical care time (minutes):  35   Critical care time was exclusive of:  Separately billable procedures and treating other patients   Critical care was necessary to treat or prevent imminent or life-threatening deterioration of the following conditions:  Circulatory failure   Critical care was time spent personally by me on the following activities:  Development of treatment plan with patient or surrogate, discussions with consultants, evaluation of patient's response to treatment, examination of patient, obtaining history from patient or surrogate, ordering and performing treatments and interventions, ordering and review of laboratory studies, ordering and review of radiographic studies, pulse oximetry, re-evaluation of patient's condition and review of old charts      Critical Care performed: yes ____________________________________________   INITIAL IMPRESSION / ASSESSMENT AND PLAN / ED COURSE  Pertinent labs & imaging results that were available during my care of the patient were reviewed by me and considered in my medical decision making (see chart for details).   DDX: Asthma, copd, CHF, pna, ptx, malignancy, Pe, anemia   Aaron Ball is a 69 y.o. who presents to the ED with presentation symptoms as described above.  Patient is ill-appearing upon arrival but protecting his airway.  Appears pale.  I suspect he is going to be anemic but also appears very edematous with diminished breath sounds throughout.  Mid has a history of CHF as well as COPD.  Blood work and radiographs we ordered for the but differential.  Patient is on supplemental oxygen.  Clinical Course as of Aug 04 954  Tue  Aug 04, 2020  [id number] Blood work does show evidence of acute anemia.  Does have some mild pulmonary edema on chest x-ray.  VBG with mild bump in his PCO2 but appears compensated.  Will give nebs.  Given his evidence of acute anemia will transfuse.  He is guaiac negative.  Denies any recent trauma.  Uncertain etiology.  Still awaiting remainder blood work.   [PR]  [phone] CMP is normal.  Have ordered blood transfusion.  Will consult hospitalist for admission.   [PR]    Clinical Course User Index [PR] Willy Eddyobinson, Kyian Obst, MD    The patient was evaluated in Emergency Department today for the symptoms described in the history of present illness. He/she was evaluated in the context of the global COVID-19 pandemic, which necessitated consideration that the patient might be at risk for infection with the SARS-CoV-2 virus that causes COVID-19. Institutional protocols and algorithms that pertain to the evaluation of patients at risk for COVID-19 are in a state of rapid change based on information released by regulatory bodies including the CDC and federal and state organizations. These policies and algorithms were followed during the patient's care in the ED.  As part of my medical decision making, I  reviewed the following data within the electronic MEDICAL RECORD NUMBER Nursing notes reviewed and incorporated, Labs reviewed, notes from prior ED visits and [place] Controlled Substance Database   ____________________________________________   FINAL CLINICAL IMPRESSION(S) / ED DIAGNOSES  Final diagnoses:  Dyspnea, unspecified type  Symptomatic anemia  Acute on chronic combined systolic and diastolic CHF (congestive heart failure) (HCC)  Acute on chronic respiratory failure with hypoxia and hypercapnia (HCC)      NEW MEDICATIONS STARTED DURING THIS VISIT:  New Prescriptions   No medications on file     Note:  This document was prepared using Dragon voice recognition software and may include unintentional  dictation errors.     Willy Eddy, MD 08/04/20 [phone]

## 2020-08-04 NOTE — ED Notes (Signed)
Medication Reconciliation Report  For Home History Technicians  HIGHLIGHTS:  1. The patient WAS personally interviewed 2. If not, what was the main source used: NOT APPLICABLE 3. Does the patient appear to take any anti-coagulation agents (e.g. warfarin, Eliquis or Xarelto): YES 4. Does the patient appear to take any anti-convulsant agents (e.g. divalproex, levetiracetam or phenytoin): NO 5. Does the patient appear to use any insulin products (e.g. Lantus, Novolin or Humalog): NO 6. Does the patient appear to take any "beta-blockers" (e.g. metoprolol, carvedilol or bisoprolol: YES  BARRIERS:  1. Were there any barriers that prevented or complicated the medication reconciliation process: NO 2. If yes, what was the primary barrier encountered: None 3. Does the patient appear compliant with prescribed medications: YES 4. Does the patient express any barriers with compliance: NO 5. What is the primary barrier the patient reports: None   NOTES:[Include any concerns, remarks or complaints the patient expresses regarding medication therapy. Any observations or other information that might be useful to the treatment team can also be included. Immediate needs or concerns should be referred to the RN or appropriate member of the treatment team.]  Patient reports taking WARFARIN as reflected in med reconciliation tab. Read back his reported schedule and patient confirmed. However, recent chart notes suggest patient was instructed to reduce Friday dose to 2.5mg  on or about 07/22/2020.    Elmo Putt, CPhT [place] at [facility] [address] [place], [place] [address] [phone]  ** The above is intended solely for informational and/or communicative purposes. It should in no way be considered an endorsement of any specific treatment, therapy or action. **

## 2020-08-04 NOTE — Plan of Care (Signed)
?  Problem: Education: ?Goal: Ability to demonstrate management of disease process will improve ?Outcome: Progressing ?  ?Problem: Activity: ?Goal: Capacity to carry out activities will improve ?Outcome: Progressing ?  ?Problem: Cardiac: ?Goal: Ability to achieve and maintain adequate cardiopulmonary perfusion will improve ?Outcome: Progressing ?  ?

## 2020-08-04 NOTE — Progress Notes (Signed)
Patient admitted to unit to room 240 at this time via bed. Transferred via sliding method accompanied by ED nurses and aide. Blood transfusion still infusing at 120/hr as stated in report. Patient tolerating well. Upon initial assessment patient A/Ox4, patient noted to be [facility] has hearing aide noted in L ear. 4L of oxygen via nasal cannula intact. VSS. SR on tele. Oriented to room and bedside monitors/call bells/remote. BSC at bedside. Will continue to monitor.

## 2020-08-04 NOTE — H&P (Addendum)
History and Physical    Aaron HammanWilliam Toney Ball ZOX:[id number] DOB: September 02, 1951 DOA: 08/04/2020  Referring MD/NP/PA:   PCP: [facility]   Patient coming from:  The patient is coming from home.  At baseline, pt is independent for most of ADL.        Chief Complaint: Shortness of breath  HPI: Aaron Ball is a 69 y.o. male with medical history significant of hypertension, hyperlipidemia, diabetes mellitus, COPD on 2 L oxygen, OSA, CAD, stent placement, atrial fibrillation on Coumadin (s/p for ablation), dCHF, gout, who presents with shortness of breath.  Patient states that he has been having shortness breath in the past 2 weeks, which has progressively worsening.  He has worsening bilateral leg edema.  No chest pain, cough, fever or chills.  Patient does not have nausea, vomiting, diarrhea or abdominal pain.  No symptoms of UTI.  No unilateral weakness. Patient denies any dark stool or rectal bleeding. He was found to have oxygen desaturated to 88% on home level 2 L oxygen, which improved her to 99% on 3 L oxygen.  ED Course: pt was found to have negative FOBT per ED physician, BNP 301, WBC 7.5, hemoglobin 6.1 (8.9 on 02/13/2020), anemia panel consistent with iron deficiency, pending Covid PCR, INR 1.9, troponin level 4, electrolytes renal function okay, GFR> 60), temperature normal, blood pressure 143/53, heart rate 56, RR 25, chest x-ray showed cardiomegaly and vascular congestion.  Patient is admitted to progressive bed as inpatient.  Review of Systems:   General: no fevers, chills, no body weight gain, has fatigue HEENT: no blurry vision, hearing changes or sore throat Respiratory: has dyspnea, coughing, no wheezing CV: no chest pain, no palpitations GI: no nausea, vomiting, abdominal pain, diarrhea, constipation GU: no dysuria, burning on urination, increased urinary frequency, hematuria  Ext: has leg edema Neuro: no unilateral weakness, numbness, or tingling, no vision change  or hearing loss Skin: no rash, no skin tear. MSK: No muscle spasm, no deformity, no limitation of range of movement in spin Heme: No easy bruising.  Travel history: No recent long distant travel.  Allergy:  Allergies  Allergen Reactions  . Erythromycin Hives and Anaphylaxis  . Cyclobenzaprine Palpitations    Triggered a fib    Past Medical History:  Diagnosis Date  . A-fib (HCC) 04/2011   a. s/p ablation x 2 w/o evi of recurrence   . Chronic diastolic CHF (congestive heart failure) (HCC)    a. echo 2012: EF >55%, nl RVSP  . COPD (chronic obstructive pulmonary disease) (HCC)   . Coronary artery disease    a. s/p stent in RCA in 1997 w/ ISR 2003 & 05/2013 s/p DES to mid RCA 05/2013; b. cardiac cath 02/2014: nonobstructive CAD with patent stent in RCA with 40% ISR, no evi of pulm HTN, nl LVEDP, EF >65%   . Diabetes mellitus (HCC)   . GERD (gastroesophageal reflux disease)   . Gout   . Hyperlipidemia   . Hypertension   . Morbid obesity (HCC)   . Obesity hypoventilation syndrome (HCC)   . OSA (obstructive sleep apnea)   . Wears hearing aid    right ear    Past Surgical History:  Procedure Laterality Date  . ATRIAL ABLATION SURGERY     A-Fib  . BARIATRIC SURGERY    . CARDIAC CATHETERIZATION     s/p stent to RCA.   [name]. CARDIAC CATHETERIZATION  2008   Dr. Juliann Paresallwood [facility] .  [name]. CARDIOVERSION N/A 09/09/2019  Procedure: CARDIOVERSION;  Surgeon: Yvonne Kendall, MD;  Location: [facility] ORS;  Service: Cardiovascular;  Laterality: N/A;  . CHOLECYSTECTOMY  2012  . heart stint  2013  . HIP FRACTURE SURGERY    . STOMACH SURGERY     gastric sleeve  . TEE WITHOUT CARDIOVERSION N/A 09/09/2019   Procedure: TRANSESOPHAGEAL ECHOCARDIOGRAM (TEE);  Surgeon: Yvonne Kendall, MD;  Location: [facility] ORS;  Service: Cardiovascular;  Laterality: N/A;    Social History:  reports that he quit smoking about 17 years ago. His smoking use included cigarettes. He has a 30.00 pack-year smoking history. He has  never used smokeless tobacco. He reports that he does not drink alcohol and does not use drugs.  Family History:  Family History  Problem Relation Age of Onset  . Heart disease Father   . Heart attack Father      Prior to Admission medications   Medication Sig Start Date End Date Taking? Authorizing Provider  acetaminophen (TYLENOL) 500 MG tablet Take 1,000 mg by mouth every 4 (four) hours as needed for pain.    [provider]  allopurinol (ZYLOPRIM) 300 MG tablet Take 300 mg by mouth daily. 06/26/19   [provider]  apixaban (ELIQUIS) 5 MG TABS tablet Take 1 tablet (5 mg total) by mouth 2 (two) times daily. 09/12/19   Standley Brooking, MD  aspirin 81 MG EC tablet Take 81 mg by mouth daily.    [provider]  atorvastatin (LIPITOR) 40 MG tablet Take 1 tablet by mouth daily. 08/10/15   [provider]  colchicine 0.6 MG tablet Take 0.6 mg by mouth daily. 07/21/15   [provider]  furosemide (LASIX) 20 MG tablet Take 1 tablet (20 mg total) by mouth 2 (two) times daily. 09/12/19   Standley Brooking, MD  gabapentin (NEURONTIN) 300 MG capsule Take 300 mg by mouth at bedtime.    [provider]  lisinopril (ZESTRIL) 20 MG tablet Take 20 mg by mouth 2 (two) times daily. 08/05/19   [provider]  loratadine (CLARITIN) 10 MG tablet Take 10 mg by mouth at bedtime. 03/01/17   [provider]  metoprolol succinate (TOPROL-XL) 25 MG 24 hr tablet Take 1 tablet (25 mg total) by mouth 2 (two) times daily. 09/12/19   Standley Brooking, MD  nitroGLYCERIN (NITROSTAT) 0.4 MG SL tablet Place 0.4 mg under the tongue every 5 (five) minutes as needed.  07/08/15   [provider]  tiZANidine (ZANAFLEX) 2 MG tablet Take 2 mg by mouth every 8 (eight) hours as needed. 07/17/19   [provider]  traMADol (ULTRAM) 50 MG tablet Take 1 tablet (50 mg total) by mouth every 6 (six) hours as needed. 02/13/20 02/12/21  Jene Every,  MD  traZODone (DESYREL) 50 MG tablet Take 50 mg by mouth at bedtime. 08/15/19   [provider]    Physical Exam: Vitals:   08/04/20 1112 08/04/20 1115 08/04/20 1130 08/04/20 1145  BP: 95/72 104/83 131/66   Pulse: (!) 115 (!) 57 (!) 58 (!) 58  Resp: (!) Temp:      TempSrc:      SpO2: (!) 85% 100% 100% 100%  Weight:      Height:       General: Not in acute distress. Pale looking HEENT:       Eyes: PERRL, EOMI, no scleral icterus.       ENT: No discharge from the ears and nose, no pharynx  injection, no tonsillar enlargement.        Neck: No JVD, no bruit, no mass felt. Heme: No neck lymph node enlargement. Cardiac: S1/S2, RRR, No murmurs, No gallops or rubs. Respiratory: Has decreased air movement bilaterally.  No wheezing or rhonchi. GI: Soft, nondistended, nontender, no rebound pain, no organomegaly, BS present. GU: No hematuria Ext: has 2+ pitting leg edema and chronic venous insufficiency change bilaterally. 1+DP/PT pulse bilaterally. Musculoskeletal: No joint deformities, No joint redness or warmth, no limitation of ROM in spin. Skin: No rashes.  Neuro: Alert, oriented X3, cranial nerves II-XII grossly intact, moves all extremities normally.  Psych: Patient is not psychotic, no suicidal or hemocidal ideation.  Labs on Admission: I have personally reviewed following labs and imaging studies  CBC: Recent Labs  Lab 08/04/20 0844  WBC 7.5  NEUTROABS 5.8  HGB 6.1*  HCT 23.6*  MCV 84.3  PLT 330   Basic Metabolic Panel: Recent Labs  Lab 08/04/20 0844  NA 138  K 4.6  CL 98  CO2 31  GLUCOSE 122*  BUN 25*  CREATININE 1.11  CALCIUM 8.8*   GFR: Estimated Creatinine Clearance: 88.7 mL/min (by C-G formula based on SCr of 1.11 mg/dL). Liver Function Tests: Recent Labs  Lab 08/04/20 0844  AST 13*  ALT 9  ALKPHOS 113  BILITOT 0.6  PROT 7.3  ALBUMIN 3.6   No results for input(s): LIPASE, AMYLASE in the last 168 hours. No results for  input(s): AMMONIA in the last 168 hours. Coagulation Profile: Recent Labs  Lab 08/04/20 0844  INR 1.9*   Cardiac Enzymes: No results for input(s): CKTOTAL, CKMB, CKMBINDEX, TROPONINI in the last 168 hours. BNP (last 3 results) No results for input(s): PROBNP in the last 8760 hours. HbA1C: No results for input(s): HGBA1C in the last 72 hours. CBG: No results for input(s): GLUCAP in the last 168 hours. Lipid Profile: No results for input(s): CHOL, HDL, LDLCALC, TRIG, CHOLHDL, LDLDIRECT in the last 72 hours. Thyroid Function Tests: No results for input(s): TSH, T4TOTAL, FREET4, T3FREE, THYROIDAB in the last 72 hours. Anemia Panel: Recent Labs    08/04/20 0844 08/04/20 0845  FOLATE  --  8.8  FERRITIN  --  7*  TIBC  --  414  IRON  --  24*  RETICCTPCT 2.9  --    Urine analysis:    Component Value Date/Time   COLORURINE STRAW (A) 08/20/2015 1410   APPEARANCEUR CLEAR (A) 08/20/2015 1410   APPEARANCEUR Cloudy 01/31/2012 1220   LABSPEC 1.012 08/20/2015 1410   LABSPEC 1.019 01/31/2012 1220   PHURINE 7.0 08/20/2015 1410   GLUCOSEU NEGATIVE 08/20/2015 1410   GLUCOSEU Negative 01/31/2012 1220   HGBUR NEGATIVE 08/20/2015 1410   BILIRUBINUR NEGATIVE 08/20/2015 1410   BILIRUBINUR Negative 01/31/2012 1220   KETONESUR NEGATIVE 08/20/2015 1410   PROTEINUR NEGATIVE 08/20/2015 1410   NITRITE NEGATIVE 08/20/2015 1410   LEUKOCYTESUR NEGATIVE 08/20/2015 1410   LEUKOCYTESUR 3+ 01/31/2012 1220   Sepsis Labs: @LABRCNTIP (procalcitonin:4,lacticidven:4) ) Recent Results (from the past 240 hour(s))  Resp Panel by RT-PCR (Flu A&B, Covid) Nasopharyngeal Swab     Status: None   Collection Time: 08/04/20 10:16 AM   Specimen: Nasopharyngeal Swab; Nasopharyngeal(NP) swabs in vial transport medium  Result Value Ref Range Status   SARS Coronavirus 2 by RT PCR NEGATIVE NEGATIVE Final    Comment: (NOTE) SARS-CoV-2 target nucleic acids are NOT DETECTED.  The SARS-CoV-2 RNA is generally detectable  in upper respiratory specimens during the acute phase of infection.  The lowest concentration of SARS-CoV-2 viral copies this assay can detect is 138 copies/mL. A negative result does not preclude SARS-Cov-2 infection and should not be used as the sole basis for treatment or other patient management decisions. A negative result may occur with  improper specimen collection/handling, submission of specimen other than nasopharyngeal swab, presence of viral mutation(s) within the areas targeted by this assay, and inadequate number of viral copies(<138 copies/mL). A negative result must be combined with clinical observations, patient history, and epidemiological information. The expected result is Negative.  Fact Sheet for Patients:  BloggerCourse.com  Fact Sheet for Healthcare Providers:  SeriousBroker.it  This test is no t yet approved or cleared by the [place] FDA and  has been authorized for detection and/or diagnosis of SARS-CoV-2 by FDA under an Emergency Use Authorization (EUA). This EUA will remain  in effect (meaning this test can be used) for the duration of the COVID-19 declaration under Section 564(b)(1) of the Act, 21 U.S.C.section 360bbb-3(b)(1), unless the authorization is terminated  or revoked sooner.       Influenza A by PCR NEGATIVE NEGATIVE Final   Influenza B by PCR NEGATIVE NEGATIVE Final    Comment: (NOTE) The Xpert Xpress SARS-CoV-2/FLU/RSV plus assay is intended as an aid in the diagnosis of influenza from Nasopharyngeal swab specimens and should not be used as a sole basis for treatment. Nasal washings and aspirates are unacceptable for Xpert Xpress SARS-CoV-2/FLU/RSV testing.  Fact Sheet for Patients: BloggerCourse.com  Fact Sheet for Healthcare Providers: SeriousBroker.it  This test is not yet approved or cleared by the [place] FDA and has  been authorized for detection and/or diagnosis of SARS-CoV-2 by FDA under an Emergency Use Authorization (EUA). This EUA will remain in effect (meaning this test can be used) for the duration of the COVID-19 declaration under Section 564(b)(1) of the Act, 21 U.S.C. section 360bbb-3(b)(1), unless the authorization is terminated or revoked.  Performed at [facility], [address], [place], [place] [address]      Radiological Exams on Admission: DG Chest Portable 1 View  Result Date: 08/04/2020 CLINICAL DATA:  Shortness of breath, eval for edema. EXAM: PORTABLE CHEST 1 VIEW COMPARISON:  February 13, 2020. FINDINGS: Similar mildly enlarged cardiac silhouette. Similar pulmonary vascular congestion. Mild interstitial thickening, most prominent at the lateral lung bases. No visible pleural effusions or pneumothorax. No acute osseous abnormality. IMPRESSION: Similar mild cardiomegaly with pulmonary vascular congestion. Mild interstitial prominence, slightly more conspicuous than on the prior and suggestive of mild interstitial edema. Electronically Signed   By: Feliberto Harts MD   On: 08/04/2020 08:52     EKG: I have personally reviewed.  Seems to be sinus rhythm, QTC 451, early R wave progression  Assessment/Plan Principal Problem:   Acute on chronic respiratory failure with hypoxemia (HCC) Active Problems:   CAD (coronary artery disease)   Hyperlipidemia   HTN (hypertension)   OSA (obstructive sleep apnea)   COPD (chronic obstructive pulmonary disease) (HCC)   Gout   Diabetes mellitus without complication (HCC)   Acute on chronic diastolic CHF (congestive heart failure) (HCC)   Symptomatic anemia   Atrial fibrillation, chronic (HCC)   Iron deficiency anemia   Acute on chronic respiratory failure with hypoxemia due to acute on chronic diastolic CHF and symptomatic anemia: 2D echo on 09/09/19 showed EF of 55 to 60%.  Patient has bilateral leg edema, elevated BNP 301,  pulmonary vascular congestion on chest x-ray, clinically consistent with a COPD exacerbation.  -  Will admit to progressive unit as inpatient -Lasix 40 mg bid by IV -2d echo -Daily weights -strict I/O's -Low salt diet -Fluid restriction -Obtain REDs Vest reading  Symptomatic anemia and Iron deficiency anemia: Hemoglobin dropped from 8.9 on 02/13/20 to 6.1.  Denies dark stool or rectal bleeding.  FOBT negative per ED physician.  Anemia panel results are consistent with iron deficiency.  Patient states that he had a colonoscopy approximately 8 years ago, but does not remember the detail findings.  Patient is on Coumadin, I am concerned that the patient may have intermittent chronic GI blood loss. -1 unit of blood was ordered by ED physician for transfusion -Give Feraheme 510 mg IV -Start ferrous sulfate supplement -As needed senna code for constipation -May need to give referral to GI for possible colonoscopy. I spoke to his son by phone and recommended to follow-up with GI doctor -Hold Coumadin and aspirin.  CAD (coronary artery disease) -Continue Lipitor, sotalol -Hold aspirin due to worsening anemia and concerning for possible chronic GI loss -As needed nitroglycerin  Hyperlipidemia: -Lipitor  HTN (hypertension) -IV hydralazine as needed -Continue sotalol, lisinopril  COPD (chronic obstructive pulmonary disease) (HCC) -Continue bronchodilators  Gout -Allopurinol and colchicine  Diabetes mellitus without complication (HCC): Recent A1c 6.9, well controlled.  Patient is taking glipizide at home -Sliding scale insulin  Atrial fibrillation, chronic (HCC) -Hold Coumadin due to worsening anemia and concerning for chronic GI loss -Continue sotalol          DVT ppx: SCD Code Status: Full code Family Communication: Yes, patient's son by phone  Disposition Plan:  Anticipate discharge back to previous environment Consults called:  none Admission status:  progressive unit as  inpt       Status is: Inpatient  Remains inpatient appropriate because:Inpatient level of care appropriate due to severity of illness.  Patient has multiple comorbidities, now presents with acute on chronic respiratory failure with hypoxia due to multifactorial etiology, including acute on chronic diastolic CHF exacerbation, symptomatic anemia and COPD.  His presentation is highly complicated.  Patient is at high risk of deterioration.  Patient will need to be treated in the hospital for the least 2 days.   Dispo: The patient is from: Home              Anticipated d/c is to: Home              Anticipated d/c date is: 2 days              Patient currently is not medically stable to d/c.          Date of Service 08/04/2020    Lorretta Harp [facility]   If 7PM-7AM, please contact night-coverage [email or web address] 08/04/2020, 12:02 PM

## 2020-08-04 NOTE — Progress Notes (Signed)
OT Cancellation Note  Patient Details Name: Aaron Ball MRN: [id number] DOB: 05-10-1951   Cancelled Treatment:    Reason Eval/Treat Not Completed: Medical issues which prohibited therapy. Consult received, chart reviewed. Pt with Hgb of 6.1, getting transfusion. Will re-attempt OT evaluation at later date/time once pt is more medically appropriate.   Richrd Prime, MPH, MS, OTR/L ascom [phone] 08/04/20, 4:08 PM

## 2020-08-04 NOTE — Plan of Care (Signed)
  Problem: Education: Goal: Ability to demonstrate management of disease process will improve Outcome: Progressing Goal: Ability to verbalize understanding of medication therapies will improve Outcome: Progressing Goal: Individualized Educational Video(s) Outcome: Progressing   Problem: Activity: Goal: Capacity to carry out activities will improve Outcome: Progressing   Problem: Cardiac: Goal: Ability to achieve and maintain adequate cardiopulmonary perfusion will improve Outcome: Progressing   Problem: Education: Goal: Knowledge of General Education information will improve Description Including pain rating scale, medication(s)/side effects and non-pharmacologic comfort measures Outcome: Progressing   Problem: Health Behavior/Discharge Planning: Goal: Ability to manage health-related needs will improve Outcome: Progressing   Problem: Clinical Measurements: Goal: Ability to maintain clinical measurements within normal limits will improve Outcome: Progressing Goal: Will remain free from infection Outcome: Progressing Goal: Diagnostic test results will improve Outcome: Progressing Goal: Respiratory complications will improve Outcome: Progressing Goal: Cardiovascular complication will be avoided Outcome: Progressing   Problem: Activity: Goal: Risk for activity intolerance will decrease Outcome: Progressing   Problem: Nutrition: Goal: Adequate nutrition will be maintained Outcome: Progressing   Problem: Coping: Goal: Level of anxiety will decrease Outcome: Progressing   Problem: Elimination: Goal: Will not experience complications related to bowel motility Outcome: Progressing Goal: Will not experience complications related to urinary retention Outcome: Progressing   Problem: Pain Managment: Goal: General experience of comfort will improve Outcome: Progressing   Problem: Safety: Goal: Ability to remain free from injury will improve Outcome: Progressing    Problem: Skin Integrity: Goal: Risk for impaired skin integrity will decrease Outcome: Progressing   Problem: Education: Goal: Knowledge of disease or condition will improve Outcome: Progressing Goal: Knowledge of the prescribed therapeutic regimen will improve Outcome: Progressing Goal: Individualized Educational Video(s) Outcome: Progressing   Problem: Activity: Goal: Ability to tolerate increased activity will improve Outcome: Progressing Goal: Will verbalize the importance of balancing activity with adequate rest periods Outcome: Progressing   Problem: Respiratory: Goal: Ability to maintain a clear airway will improve Outcome: Progressing Goal: Levels of oxygenation will improve Outcome: Progressing Goal: Ability to maintain adequate ventilation will improve Outcome: Progressing   

## 2020-08-05 DIAGNOSIS — J9621 Acute and chronic respiratory failure with hypoxia: Secondary | ICD-10-CM | POA: Diagnosis not present

## 2020-08-05 LAB — BASIC METABOLIC PANEL
Anion gap: 12 (ref 5–15)
BUN: 20 mg/dL (ref 8–23)
CO2: 31 mmol/L (ref 22–32)
Calcium: 8.9 mg/dL (ref 8.9–10.3)
Chloride: 98 mmol/L (ref 98–111)
Creatinine, Ser: 1.2 mg/dL (ref 0.61–1.24)
GFR, Estimated: >60 mL/min (ref >60)
Glucose, Bld: 106 mg/dL — ABNORMAL HIGH (ref 70–99)
Potassium: 4.1 mmol/L (ref 3.5–5.1)
Sodium: 141 mmol/L (ref 135–145)

## 2020-08-05 LAB — CBC
HCT: 27.4 % — ABNORMAL LOW (ref 39.0–52.0)
HCT: 26.3 % — ABNORMAL LOW (ref 39.0–52.0)
HCT: 27 % — ABNORMAL LOW (ref 39.0–52.0)
Hemoglobin: 7.3 g/dL — ABNORMAL LOW (ref 13.0–17.0)
Hemoglobin: 7.1 g/dL — ABNORMAL LOW (ref 13.0–17.0)
Hemoglobin: 7.2 g/dL — ABNORMAL LOW (ref 13.0–17.0)
MCH: 22.5 pg — ABNORMAL LOW (ref 26.0–34.0)
MCH: 22.8 pg — ABNORMAL LOW (ref 26.0–34.0)
MCH: 22.7 pg — ABNORMAL LOW (ref 26.0–34.0)
MCHC: 26.6 g/dL — ABNORMAL LOW (ref 30.0–36.0)
MCHC: 27 g/dL — ABNORMAL LOW (ref 30.0–36.0)
MCHC: 26.7 g/dL — ABNORMAL LOW (ref 30.0–36.0)
MCV: 84.3 fL (ref 80.0–100.0)
MCV: 84.3 fL (ref 80.0–100.0)
MCV: 85.2 fL (ref 80.0–100.0)
Platelets: 320 10*3/uL (ref 150–400)
Platelets: 295 10*3/uL (ref 150–400)
Platelets: 302 10*3/uL (ref 150–400)
RBC: 3.25 MIL/uL — ABNORMAL LOW (ref 4.22–5.81)
RBC: 3.12 MIL/uL — ABNORMAL LOW (ref 4.22–5.81)
RBC: 3.17 MIL/uL — ABNORMAL LOW (ref 4.22–5.81)
RDW: 18.4 % — ABNORMAL HIGH (ref 11.5–15.5)
RDW: 18.8 % — ABNORMAL HIGH (ref 11.5–15.5)
RDW: 18.7 % — ABNORMAL HIGH (ref 11.5–15.5)
WBC: 8.7 10*3/uL (ref 4.0–10.5)
WBC: 7.7 10*3/uL (ref 4.0–10.5)
WBC: 7.4 10*3/uL (ref 4.0–10.5)
nRBC: 0 % (ref 0.0–0.2)
nRBC: 0 % (ref 0.0–0.2)
nRBC: 0 % (ref 0.0–0.2)

## 2020-08-05 LAB — PREPARE RBC (CROSSMATCH)

## 2020-08-05 LAB — GLUCOSE, CAPILLARY
Glucose-Capillary: 99 mg/dL (ref 70–99)
Glucose-Capillary: 106 mg/dL — ABNORMAL HIGH (ref 70–99)
Glucose-Capillary: 136 mg/dL — ABNORMAL HIGH (ref 70–99)
Glucose-Capillary: 111 mg/dL — ABNORMAL HIGH (ref 70–99)

## 2020-08-05 LAB — MAGNESIUM: Magnesium: 2.3 mg/dL (ref 1.7–2.4)

## 2020-08-05 LAB — ECHOCARDIOGRAM COMPLETE
Area-P 1/2: 3.91 cm2
Height: 67 in
S' Lateral: 2.93 cm
Weight: 4902.4 oz

## 2020-08-05 MED ORDER — IPRATROPIUM-ALBUTEROL 0.5-2.5 (3) MG/3ML IN SOLN
3.0000 mL | Freq: Two times a day (BID) | RESPIRATORY_TRACT | Status: DC
  Administered 2020-08-06 – 2020-08-08 (×5): 3 mL via RESPIRATORY_TRACT
  Filled 2020-08-05 (×5): qty 3

## 2020-08-05 MED ORDER — SODIUM CHLORIDE 0.9% IV SOLUTION: Freq: Once | INTRAVENOUS | Status: AC

## 2020-08-05 MED ORDER — TRAMADOL HCL 50 MG PO TABS
50.0000 mg | ORAL_TABLET | Freq: Four times a day (QID) | ORAL | Status: DC | PRN
  Administered 2020-08-06 – 2020-08-07 (×3): 50 mg via ORAL
  Filled 2020-08-05 (×4): qty 1

## 2020-08-05 MED ORDER — FUROSEMIDE 10 MG/ML IJ SOLN: 20.0000 mg | Freq: Once | INTRAMUSCULAR | Status: DC

## 2020-08-05 MED ORDER — DIPHENHYDRAMINE HCL 25 MG PO CAPS
25.0000 mg | ORAL_CAPSULE | Freq: Once | ORAL | Status: DC
  Filled 2020-08-05: qty 1

## 2020-08-05 NOTE — Plan of Care (Signed)
°  Problem: Cardiac: Goal: Ability to achieve and maintain adequate cardiopulmonary perfusion will improve Outcome: Progressing   Problem: Clinical Measurements: Goal: Respiratory complications will improve Outcome: Progressing   Problem: Clinical Measurements: Goal: Diagnostic test results will improve Outcome: Progressing   Problem: Pain Managment: Goal: General experience of comfort will improve Outcome: Progressing   Problem: Activity: Goal: Will verbalize the importance of balancing activity with adequate rest periods Outcome: Progressing   Problem: Respiratory: Goal: Ability to maintain a clear airway will improve Outcome: Progressing

## 2020-08-05 NOTE — Plan of Care (Signed)
Patient a/ox4. Up ad lib x1 assist. On 4L via nasal cannula tolerating well. No acute events during shift. Able to verbalize needs. 1 unit of packed RBCs infusing at this time, tolerating well. Will continue to monitor. Problem: Education: Goal: Ability to demonstrate management of disease process will improve Outcome: Progressing   Problem: Activity: Goal: Capacity to carry out activities will improve Outcome: Progressing   Problem: Education: Goal: Knowledge of General Education information will improve Description: Including pain rating scale, medication(s)/side effects and non-pharmacologic comfort measures Outcome: Progressing

## 2020-08-05 NOTE — Evaluation (Signed)
Physical Therapy Evaluation Patient Details Name: Aaron Ball MRN: [id number] DOB: 03-09-1951 Today's Date: 08/05/2020   History of Present Illness  Pt is a 69 y.o. male presenting to hospital 11/23 with increasing SOB, orthopnea, and fatigue.  Pt admitted with acute on chronic respiratory failure with hypoxemia d/t acute on chronic diastolic CHF and symptomatic anemia (iron deficiency anemia; s/p blood transfusion).  PMH includes a-fib, chronic diastolic CHF, COPD on 2 L home O2, CAD, DM, gout, htn, OSA, h/o bariatric surgery, cardiac cath, cardioversion, h/o hip fx surgery, TEE.  Clinical Impression  Prior to hospital admission, pt was modified independent ambulating with walker; lives alone in 1 level home with ramp to enter; 2 L home O2 use recently.  Currently pt is SBA with transfers and CGA ambulating up to 40 feet with RW.  Limited distance d/t generalized weakness (mild SOB also noted); pt reporting feeling better than yesterday though.  Pt's O2 sats 93% or greater on 4 L O2 via nasal cannula during sessions activities.  Pt would benefit from skilled PT to address noted impairments and functional limitations (see below for any additional details).  Upon hospital discharge, pt would benefit from HHPT.    Follow Up Recommendations Home health PT    Equipment Recommendations  None recommended by PT (pt has RW and 4ww at home already)    Recommendations for Other Services OT consult     Precautions / Restrictions Precautions Precautions: Fall Restrictions Weight Bearing Restrictions: No      Mobility  Bed Mobility               General bed mobility comments: Deferred (pt in recliner beginning/end of session)    Transfers Overall transfer level: Needs assistance Equipment used: Rolling walker (2 wheeled) Transfers: Sit to/from Stand Sit to Stand: Supervision         General transfer comment: x1 trial from recliner and x1 trial from toilet;  steady  Ambulation/Gait Ambulation/Gait assistance: Min guard Gait Distance (Feet):  (25 feet to bathroom; 40 feet) Assistive device: Rolling walker (2 wheeled)   Gait velocity: decreased   General Gait Details: partial step through gait pattern; steady with RW; limited distance d/t pt's reports of weakness  Stairs            Wheelchair Mobility    Modified Rankin (Stroke Patients Only)       Balance Overall balance assessment: Needs assistance Sitting-balance support: No upper extremity supported;Feet supported Sitting balance-Leahy Scale: Good Sitting balance - Comments: steady sitting reaching within BOS   Standing balance support: Single extremity supported Standing balance-Leahy Scale: Fair Standing balance comment: steady standing with at least single UE support                             Pertinent Vitals/Pain Pain Assessment: 0-10 Pain Score: 6  Pain Location: chronic back pain Pain Descriptors / Indicators: Aching;Sore Pain Intervention(s): Limited activity within patient's tolerance;Monitored during session;Repositioned  Vitals (HR and O2 on 4 L via nasal cannula) stable and WFL throughout treatment session.    Home Living Family/patient expects to be discharged to:: Private residence Living Arrangements: Alone Available Help at Discharge: Friend(s) Type of Home: House Home Access: Ramped entrance     Home Layout: One level Home Equipment: Environmental consultant - 2 wheels;Walker - 4 wheels;Cane - single point;Grab bars - tub/shower;Toilet riser      Prior Function Level of Independence: Independent with assistive device(s)  Comments: Pt uses 4ww in parts of home and RW in other parts of home (depending on space).  2 L home O2 most recently.  Pt reports no falls in past 6 months.     Hand Dominance        Extremity/Trunk Assessment   Upper Extremity Assessment Upper Extremity Assessment: Generalized weakness    Lower Extremity  Assessment Lower Extremity Assessment: Generalized weakness    Cervical / Trunk Assessment Cervical / Trunk Assessment: Other exceptions Cervical / Trunk Exceptions: forward head/shoulders  Communication   Communication: HOH (R hearing aide)  Cognition Arousal/Alertness: Awake/alert Behavior During Therapy: WFL for tasks assessed/performed Overall Cognitive Status: Within Functional Limits for tasks assessed                                        General Comments   Nursing cleared pt for participation in physical therapy.  Pt agreeable to PT session.  Pt with nasal cannula in place upon PT entry but not hooked up to supplemental O2 (O2 reading 86% on room air); pt placed back on 4 L O2 via nasal cannula and O2 sats 93% or greater during sessions activities); nurse notified regarding above.    Exercises  Transfers and ambulation   Assessment/Plan    PT Assessment Patient needs continued PT services  PT Problem List Decreased strength;Decreased balance;Decreased activity tolerance;Decreased mobility;Cardiopulmonary status limiting activity       PT Treatment Interventions DME instruction;Gait training;Functional mobility training;Therapeutic activities;Therapeutic exercise;Balance training;Patient/family education    PT Goals (Current goals can be found in the Care Plan section)  Acute Rehab PT Goals Patient Stated Goal: to improve breathing and go home PT Goal Formulation: With patient Time For Goal Achievement: 08/19/20 Potential to Achieve Goals: Fair    Frequency Min 2X/week   Barriers to discharge        Co-evaluation               AM-PAC PT "6 Clicks" Mobility  Outcome Measure Help needed turning from your back to your side while in a flat bed without using bedrails?: None Help needed moving from lying on your back to sitting on the side of a flat bed without using bedrails?: A Little Help needed moving to and from a bed to a chair (including  a wheelchair)?: A Little Help needed standing up from a chair using your arms (e.g., wheelchair or bedside chair)?: A Little Help needed to walk in hospital room?: A Little Help needed climbing 3-5 steps with a railing? : A Lot 6 Click Score: 18    End of Session Equipment Utilized During Treatment: Gait belt;Oxygen (4 L O2 via nasal cannula) Activity Tolerance: Patient limited by fatigue Patient left: in chair;with call bell/phone within reach;with chair alarm set;Other (comment) (B heels floating via pillow support) Nurse Communication: Mobility status;Precautions;Other (comment) (pt's O2 set-up) PT Visit Diagnosis: Other abnormalities of gait and mobility (R26.89);Muscle weakness (generalized) (M62.81);Difficulty in walking, not elsewhere classified (R26.2)    Time: 3220-2542 PT Time Calculation (min) (ACUTE ONLY): 31 min   Charges:   PT Evaluation $PT Eval Low Complexity: 1 Low PT Treatments $Therapeutic Activity: 8-22 mins       Hendricks Limes, PT 08/05/20, 12:41 PM

## 2020-08-05 NOTE — Evaluation (Signed)
Occupational Therapy Evaluation Patient Details Name: Aaron Ball MRN: [id number] DOB: Feb 12, 1951 Today's Date: 08/05/2020    History of Present Illness Pt is a 69 y.o. male presenting to hospital 11/23 with increasing SOB, orthopnea, and fatigue.  Pt admitted with acute on chronic respiratory failure with hypoxemia d/t acute on chronic diastolic CHF and symptomatic anemia (iron deficiency anemia; s/p blood transfusion).  PMH includes a-fib, chronic diastolic CHF, COPD on 2 L home O2, CAD, DM, gout, htn, OSA, h/o bariatric surgery, cardiac cath, cardioversion, h/o hip fx surgery, TEE.   Clinical Impression   Pt was seen for OT evaluation this date. Prior to hospital admission, pt was INDEP with self care ADLs and MOD I with ADL mobility. Pt lives alone in [facility] with ramped entrance. Currently pt demonstrates impairments as described below (See OT problem list) which functionally limit his ability to perform ADL/self-care tasks. Pt currently requires SUPV with RW for ADL transfers, CGA with standing UB self care/SETUP seated and MOD A for seated LB ADLs. Pt demos decreased standing tolerance of ~30 seconds.  Pt would benefit from skilled OT services to address noted impairments and functional limitations (see below for any additional details) in order to maximize safety and independence while minimizing falls risk and caregiver burden. Upon hospital discharge, recommend HHOT to maximize pt safety and return to functional independence during meaningful occupations of daily life.     Follow Up Recommendations  Home health OT    Equipment Recommendations  Tub/shower seat;Other (comment) (LH shoe horn and grabber/reacher)    Recommendations for Other Services       Precautions / Restrictions Precautions Precautions: Fall Restrictions Weight Bearing Restrictions: No      Mobility Bed Mobility               General bed mobility comments: Deferred (pt in recliner beginning/end of  session)    Transfers Overall transfer level: Needs assistance Equipment used: Rolling walker (2 wheeled) Transfers: Sit to/from Stand Sit to Stand: Supervision         General transfer comment: pt demos good control and safe hand placement/sequence    Balance Overall balance assessment: Needs assistance Sitting-balance support: No upper extremity supported;Feet supported Sitting balance-Leahy Scale: Good Sitting balance - Comments: steady sitting reaching within BOS   Standing balance support: Single extremity supported Standing balance-Leahy Scale: Fair Standing balance comment: steady standing with at least single UE support                           ADL either performed or assessed with clinical judgement   ADL                                         General ADL Comments: requires SETUP for seated UB ADLs, MOD A for seated LB ADLs, SUPV for ADL transfers with RW     Vision Baseline Vision/History: Wears glasses Wears Glasses: At all times Patient Visual Report: No change from baseline       Perception     Praxis      Pertinent Vitals/Pain Pain Assessment: 0-10 Pain Score: 6  Pain Location: chronic back pain Pain Descriptors / Indicators: Aching;Sore Pain Intervention(s): Limited activity within patient's tolerance;Monitored during session     Hand Dominance     Extremity/Trunk Assessment Upper Extremity Assessment Upper Extremity Assessment: Generalized weakness  Lower Extremity Assessment Lower Extremity Assessment: Generalized weakness   Cervical / Trunk Assessment Cervical / Trunk Assessment: Other exceptions Cervical / Trunk Exceptions: forward head/shoulders   Communication Communication Communication: HOH (R hearing aide)   Cognition Arousal/Alertness: Awake/alert Behavior During Therapy: WFL for tasks assessed/performed Overall Cognitive Status: Within Functional Limits for tasks assessed                                      General Comments       Exercises Other Exercises Other Exercises: OT facilitates ed re: role of OT in acute setting, importance of OOB activity, strengthening tasks pt can do in chair such as pushing through arms to strengthen triceps and contralateral reaching to improve lung function/trunk strength for balance. Pt with good reception.   Shoulder Instructions      Home Living Family/patient expects to be discharged to:: Private residence Living Arrangements: Alone Available Help at Discharge: Friend(s) Type of Home: House Home Access: Ramped entrance     Home Layout: One level     Bathroom Shower/Tub: Producer, television/film/video: Standard (with toilet riser)     Home Equipment: Walker - 2 wheels;Walker - 4 wheels;Cane - single point;Grab bars - tub/shower;Toilet riser   Additional Comments: reports only wearing slip on shoes, no socks for years d/t difficulty.      Prior Functioning/Environment Level of Independence: Independent with assistive device(s)        Comments: Pt uses 4ww in parts of home and RW in other parts of home (depending on space).  2 L home O2 most recently.  Pt reports no falls in past 6 months.        OT Problem List: Decreased strength;Decreased range of motion;Decreased activity tolerance;Cardiopulmonary status limiting activity;Obesity      OT Treatment/Interventions: Self-care/ADL training;DME and/or AE instruction;Therapeutic activities;Balance training;Therapeutic exercise;Energy conservation;Patient/family education    OT Goals(Current goals can be found in the care plan section) Acute Rehab OT Goals Patient Stated Goal: to improve breathing and go home OT Goal Formulation: With patient Time For Goal Achievement: 08/19/20 Potential to Achieve Goals: Good ADL Goals Pt Will Perform Grooming: with supervision;standing (with LRAD sink-side to complete 2-3 g/h tasks to increase standing fxl activity  tolerance.) Pt Will Perform Lower Body Dressing: with min guard assist;sit to/from stand (with AE PRN) Pt Will Transfer to Toilet: with supervision;ambulating (to commode in restroom with RW) Pt Will Perform Toileting - Clothing Manipulation and hygiene: with supervision;sit to/from stand Pt/caregiver will Perform Home Exercise Program: Increased strength;Both right and left upper extremity;With Supervision  OT Frequency: Min 1X/week   Barriers to D/C:            Co-evaluation              AM-PAC OT "6 Clicks" Daily Activity     Outcome Measure Help from another person eating meals?: None Help from another person taking care of personal grooming?: A Little Help from another person toileting, which includes using toliet, bedpan, or urinal?: A Little Help from another person bathing (including washing, rinsing, drying)?: A Little Help from another person to put on and taking off regular upper body clothing?: None Help from another person to put on and taking off regular lower body clothing?: A Lot 6 Click Score: 19   End of Session Equipment Utilized During Treatment: Gait belt;Rolling walker;Oxygen Nurse Communication: Mobility status  Activity Tolerance:  Patient tolerated treatment well Patient left: in chair;with call bell/phone within reach;with chair alarm set  OT Visit Diagnosis: Unsteadiness on feet (R26.81);Muscle weakness (generalized) (M62.81)                Time: 1352-1430 OT Time Calculation (min): 38 min Charges:  OT General Charges $OT Visit: 1 Visit OT Evaluation $OT Eval Moderate Complexity: 1 Mod OT Treatments $Self Care/Home Management : 8-22 mins $Therapeutic Activity: 8-22 mins  Rejeana Brock, MS, OTR/L ascom [phone] 08/05/20, 4:50 PM

## 2020-08-05 NOTE — Progress Notes (Addendum)
  Heart Failure Navigator Consult Note  HFpEF 55 to 60% by echocardiogram performed in December 2020.  Echocardiogram is pending on this admission.  He presented with a two week history of shortness of breath.  Co morbidities:  Hypertension Hyperlipidemia Diabetes COPD Obstructive sleep apnea Coronary artery disease with stenting to the RCA Atrial fibrillation status post ablation x2 Gout Iron deficiency anemia Morbid obesity  Medications:  Lasix 40 mg mg IV every 12 hours Lisinopril 10 mg daily Sotalol 160 mg twice daily  Labs: BNP on admission was 301 Sodium 141, potassium 4.1, BUN 20, creatinine 1.2, hemoglobin 7.3, hematocrit 27.4.  Magnesium 2.3, GFR greater than 60. Weight 142.9 kg Intake 1131 mL Output not documented BMI 49.35 Blood pressure 85/67 with a pulse of 58 On the monitor he is in the sinus rhythm   Assessment:  General-he is awake and alert sitting up in the chair in no acute distress.  HEENT-he is hard of hearing with aid in the right ear.  Normocephalic.  Cardiac-heart tones of regular rate and rhythm no murmurs or gallops.  Chest-breath sounds were clear to posterior auscultation  Abdomen is obese rounded.  Musculoskeletal-legs are edematous with skin discoloration significant for venous stasis.  Psych he is pleasant and appropriate  Neurologic-speech is clear he moves all extremities without difficulty.     Discussed heart failure with the patient.  Talked about the importance of weighing daily and what to report to physician.  Discussed low-sodium diet and not using salt.  He was given the heart failure teaching booklet along with his own magnet.  Reviewed the videos on heart failure.  Tresa Endo RN, CHFN

## 2020-08-05 NOTE — Progress Notes (Signed)
PROGRESS NOTE   Aaron Ball  WUJ:[id number] DOB: [birth year] DOA: 08/04/2020 PCP: [facility]  Brief Narrative:  69 year old white male COPD/2 L of oxygen [for the past couple weeks from PCP], OSA, CAD s/p PCI 1997 RCA-nonobstructive disease on catheter 2015 atrial fibrillation unsuccessful DCCV X2 on Coumadin-follows [facility] cardiology Dr. Angelita Ingles, gout HTN HLD DM TY 2  Admit 08/04/2020 SOB, DOE initial O2 sat 89% increased to 4 L-CXR showed pulmonary edema-blood work revealed hemoglobin 6.1 (baseline 02/13/2020 8.9-)-Rx 2 units PRBC in ED, given Feraheme in addition   Timeline  Admission 12/22 through 09/12/2019 with a flutter status post DCCV placed on Eliquis  Admission 1/26 through 10/09/2015 pleuritic chest pain PE ruled out  Admission 12/7 through 08/21/2015 AKI hyperkalemia  Admission 6/8 through 02/18/2013 with stable angina  Assessment & Plan:   Principal Problem:   Acute on chronic respiratory failure with hypoxemia (HCC) Active Problems:   CAD (coronary artery disease)   Hyperlipidemia   HTN (hypertension)   COPD (chronic obstructive pulmonary disease) (HCC)   Gout   Diabetes mellitus without complication (HCC)   Acute on chronic diastolic CHF (congestive heart failure) (HCC)   Symptomatic anemia   Atrial fibrillation, chronic (HCC)   Iron deficiency anemia   1. Acute hypoxic respiratory failure a. Secondary to acute exacerbation HFpEF in the setting of low hemoglobin and probably high-output heart failure b. Last EF 45 to 60% 08/2019 BNP 300-diuresis with Lasix 40 every 12, c. -1.78 L since admission, weight at home varies patient from 329 10/15/2017 he is currently at 312 d. Continue to diurese as able 2. ?  GI bleed with negative FOBT 3. Has some nasal bleeding at times 4. Probably causing high-output heart failure a. Transfused 1 unit PRBC-given Feraheme x111/23 continue ferrous sulfate 325 twice daily b. Recheck labs in a.m. and if still  around 7 would give another unit c. Outpatient follow-up and consideration for colonoscopy-tells me last 1 was "normal" and does not have any dark or tarry stools 5. Underlying COPD baseline 2 L oxygen at home a. Has been on oxygen "for few weeks" b. Continue albuterol every 4 as needed DuoNeb 3 times daily 6. CAD PCI 1997 7. A. fib CHA2DS2-VASc >4 on Eliquis a. Continue lisinopril 10 daily, sotalol 160 twice daily, atorvastatin 40 daily-remains in sinus rhythm predominantly 8. DM TY 2 A1c recently 6.9 a. CBG ranges 99-1 36 eating 90% of meals b. Continue sliding scale c. Resume glipizide 5 on discharge 9. Gout a. Continue colchicine 0.6  DVT prophylaxis: Eliquis Code Status: Full Family Communication: Called son [phone] to update Disposition:  Status is: Inpatient  Remains inpatient appropriate because:Ongoing active pain requiring inpatient pain management and IV treatments appropriate due to intensity of illness or inability to take PO   Dispo: The patient is from: Home              Anticipated d/c is to: Home              Anticipated d/c date is: 1 day              Patient currently is not medically stable to d/c.   Consultants:     Procedures:   Antimicrobials:     Subjective: Well no distress main complaint today is having back pain and asking if he can have some tramadol Breath is at baseline he does not feel terribly swollen  Objective: Vitals:   08/04/20 2000 08/04/20 2011 08/04/20 2231 08/05/20  0437  BP:  (!) 104/34  90/76  Pulse:  65  (!) 58  Resp:  20 (!) 26 19  Temp:  98.2 F (36.8 C)  98.6 F (37 C)  TempSrc:  Oral  Oral  SpO2: 99% 98%  99%  Weight:    (!) 142.9 kg  Height:        Intake/Output Summary (Last 24 hours) at 08/05/2020 0801 Last data filed at 08/04/2020 2106 Gross per 24 hour  Intake 1131.33 ml  Output 0 ml  Net 1131.33 ml   Filed Weights   08/04/20 0841 08/04/20 1539 08/05/20 0437  Weight: (!) 147 kg (!) 139 kg (!) 142.9 kg     Examination: Awake obese pleasant hard of hearing a little Chest relatively clear, mild JVD Abdomen soft no rebound no guarding very obese Grade 1 lower extremity edema bilaterally with venous stasis changes S1-S2 no murmur seems to be normal sinus rhythm   Data Reviewed: I have personally reviewed following labs and imaging studies   Radiology Studies: DG Chest Portable 1 View  Result Date: 08/04/2020 CLINICAL DATA:  Shortness of breath, eval for edema. EXAM: PORTABLE CHEST 1 VIEW COMPARISON:  February 13, 2020. FINDINGS: Similar mildly enlarged cardiac silhouette. Similar pulmonary vascular congestion. Mild interstitial thickening, most prominent at the lateral lung bases. No visible pleural effusions or pneumothorax. No acute osseous abnormality. IMPRESSION: Similar mild cardiomegaly with pulmonary vascular congestion. Mild interstitial prominence, slightly more conspicuous than on the prior and suggestive of mild interstitial edema. Electronically Signed   By: Feliberto Harts MD   On: 08/04/2020 08:52     Scheduled Meds: . allopurinol  100 mg Oral Daily  . allopurinol  300 mg Oral Daily  . atorvastatin  40 mg Oral QPM  . ferrous sulfate  325 mg Oral BID WC  . furosemide  40 mg Intravenous Q12H  . gabapentin  900 mg Oral QHS  . insulin aspart  0-5 Units Subcutaneous QHS  . insulin aspart  0-9 Units Subcutaneous TID WC  . ipratropium-albuterol  3 mL Nebulization TID  . lisinopril  10 mg Oral Daily  . pantoprazole  40 mg Oral BID  . sodium chloride flush  3 mL Intravenous Q12H  . sotalol  160 mg Oral BID   Continuous Infusions: . sodium chloride       LOS: 1 day    Time spent: 17  Rhetta Mura, MD [facility] To contact the attending provider between 7A-7P or the covering provider during after hours 7P-7A, please log into the web site [email or web address] and access using universal [place] password for that web site. If you do not have the password, please  call the hospital operator.  08/05/2020, 8:01 AM

## 2020-08-06 DIAGNOSIS — J9621 Acute and chronic respiratory failure with hypoxia: Secondary | ICD-10-CM | POA: Diagnosis not present

## 2020-08-06 LAB — COMPREHENSIVE METABOLIC PANEL
ALT: 9 U/L (ref 0–44)
AST: 13 U/L — ABNORMAL LOW (ref 15–41)
Albumin: 3.6 g/dL (ref 3.5–5.0)
Alkaline Phosphatase: 109 U/L (ref 38–126)
Anion gap: 10 (ref 5–15)
BUN: 19 mg/dL (ref 8–23)
CO2: 34 mmol/L — ABNORMAL HIGH (ref 22–32)
Calcium: 8.7 mg/dL — ABNORMAL LOW (ref 8.9–10.3)
Chloride: 97 mmol/L — ABNORMAL LOW (ref 98–111)
Creatinine, Ser: 1.41 mg/dL — ABNORMAL HIGH (ref 0.61–1.24)
GFR, Estimated: 54 mL/min — ABNORMAL LOW (ref >60)
Glucose, Bld: 96 mg/dL (ref 70–99)
Potassium: 3.6 mmol/L (ref 3.5–5.1)
Sodium: 141 mmol/L (ref 135–145)
Total Bilirubin: 0.8 mg/dL (ref 0.3–1.2)
Total Protein: 7.2 g/dL (ref 6.5–8.1)

## 2020-08-06 LAB — GLUCOSE, CAPILLARY
Glucose-Capillary: 92 mg/dL (ref 70–99)
Glucose-Capillary: 101 mg/dL — ABNORMAL HIGH (ref 70–99)
Glucose-Capillary: 91 mg/dL (ref 70–99)
Glucose-Capillary: 182 mg/dL — ABNORMAL HIGH (ref 70–99)

## 2020-08-06 LAB — TYPE AND SCREEN
ABO/RH(D): O POS
Antibody Screen: NEGATIVE
Unit division: 0
Unit division: 0

## 2020-08-06 LAB — BPAM RBC
Blood Product Expiration Date: 202112252359
Blood Product Expiration Date: 202112302359
ISSUE DATE / TIME: 202111231146
ISSUE DATE / TIME: 202111241802
Unit Type and Rh: 5100
Unit Type and Rh: 5100

## 2020-08-06 LAB — CBC WITH DIFFERENTIAL/PLATELET
Abs Immature Granulocytes: 0.04 10*3/uL (ref 0.00–0.07)
Basophils Absolute: 0.1 10*3/uL (ref 0.0–0.1)
Basophils Relative: 1 %
Eosinophils Absolute: 0.3 10*3/uL (ref 0.0–0.5)
Eosinophils Relative: 3 %
HCT: 29.1 % — ABNORMAL LOW (ref 39.0–52.0)
Hemoglobin: 7.8 g/dL — ABNORMAL LOW (ref 13.0–17.0)
Immature Granulocytes: 1 %
Lymphocytes Relative: 12 %
Lymphs Abs: 1 10*3/uL (ref 0.7–4.0)
MCH: 23.2 pg — ABNORMAL LOW (ref 26.0–34.0)
MCHC: 26.8 g/dL — ABNORMAL LOW (ref 30.0–36.0)
MCV: 86.6 fL (ref 80.0–100.0)
Monocytes Absolute: 1 10*3/uL (ref 0.1–1.0)
Monocytes Relative: 11 %
Neutro Abs: 6.3 10*3/uL (ref 1.7–7.7)
Neutrophils Relative %: 72 %
Platelets: 299 10*3/uL (ref 150–400)
RBC: 3.36 MIL/uL — ABNORMAL LOW (ref 4.22–5.81)
RDW: 18.6 % — ABNORMAL HIGH (ref 11.5–15.5)
WBC: 8.8 10*3/uL (ref 4.0–10.5)
nRBC: 0 % (ref 0.0–0.2)

## 2020-08-06 MED ORDER — FUROSEMIDE 20 MG PO TABS
20.0000 mg | ORAL_TABLET | Freq: Two times a day (BID) | ORAL | Status: DC
  Administered 2020-08-06: 20 mg via ORAL
  Filled 2020-08-06: qty 1

## 2020-08-06 NOTE — Plan of Care (Signed)
  Problem: Education: Goal: Ability to demonstrate management of disease process will improve Outcome: Progressing   Problem: Activity: Goal: Capacity to carry out activities will improve Outcome: Progressing   Problem: Cardiac: Goal: Ability to achieve and maintain adequate cardiopulmonary perfusion will improve Outcome: Progressing   Problem: Health Behavior/Discharge Planning: Goal: Ability to manage health-related needs will improve Outcome: Progressing   

## 2020-08-06 NOTE — Progress Notes (Addendum)
PROGRESS NOTE   Aaron Ball  NGE:[id number] DOB: [birth year] DOA: 08/04/2020 PCP: [facility]  Brief Narrative:  69 year old white male COPD/2 L of oxygen [for the past couple weeks from PCP], OSA, CAD s/p PCI 1997 RCA-nonobstructive disease on catheter 2015 atrial fibrillation unsuccessful DCCV X2 on Coumadin-follows [facility] cardiology Dr. Angelita Ingles, gout HTN HLD DM TY 2  Admit 08/04/2020 SOB, DOE initial O2 sat 89% increased to 4 L-CXR showed pulmonary edema-blood work revealed hemoglobin 6.1 (baseline 02/13/2020 8.9-)-Rx 2 units PRBC in ED, given Feraheme in addition  Assessment & Plan:   Principal Problem:   Acute on chronic respiratory failure with hypoxemia (HCC) Active Problems:   CAD (coronary artery disease)   Hyperlipidemia   HTN (hypertension)   COPD (chronic obstructive pulmonary disease) (HCC)   Gout   Diabetes mellitus without complication (HCC)   Acute on chronic diastolic CHF (congestive heart failure) (HCC)   Symptomatic anemia   Atrial fibrillation, chronic (HCC)   Iron deficiency anemia   1. Acute hypoxic respiratory failure a. Secondary to acute exacerbation HFpEF in the setting of low hemoglobin and probably high-output heart failure b. Last EF 45 to 60% 08/2019 BNP 300-diuresis from iv to po home dose 20 bid 11/25 c. -2.4 L since admission, weight at home varies 319-329 lbs he is currently at 310 2. ?  GI bleed with negative FOBT 3. Has some nasal bleeding at times 4. Probably causing high-output heart failure a. Transfused 2 unit PRBC 11/24-given Feraheme x1 on 11/23 continue ferrous sulfate 325 twice daily b. OPconsideration for colonoscopy-tells me last 1 was "normal" --has been doing cologaurd for several yrs 5. Underlying COPD baseline 2 L oxygen at home a. Has been on oxygen "for few weeks" b. Continue albuterol every 4 as needed DuoNeb 3 times daily 6. CAD PCI 1997 7. A. fib CHA2DS2-VASc >4 on Eliquis a. Continue lisinopril 10 daily,  sotalol 160 twice daily, atorvastatin 40 daily-remains in sinus rhythm predominantly 8. DM TY 2 A1c recently 6.9 a. eating 90% of meals b. Continue sliding scale cbg 92-101 c. Resume glipizide 5 on discharge 9. Gout a. Continue colchicine 0.6  DVT prophylaxis: Eliquis Code Status: Full Family Communication: Called son [phone] to update 11/25 Disposition:  Status is: Inpatient  Remains inpatient appropriate because:Ongoing active pain requiring inpatient pain management and IV treatments appropriate due to intensity of illness or inability to take PO   Dispo: The patient is from: Home              Anticipated d/c is to: Home              Anticipated d/c date is: 1 day              Patient currently is not medically stable to d/c.   Consultants:     Procedures:   Antimicrobials:     Subjective:  Well coherent Some LBP today no cp  breathing fair No dark stool  Objective: Vitals:   08/05/20 2254 08/06/20 0353 08/06/20 0743 08/06/20 1154  BP:  110/65 (!) 116/47 125/62  Pulse:  (!) 58 (!) 56 (!) 51  Resp: 19 18 18 18   Temp:  97.8 F (36.6 C) 97.9 F (36.6 C) 98.4 F (36.9 C)  TempSrc:  Oral Oral   SpO2:  100% 100% 92%  Weight:  (!) 141.7 kg    Height:        Intake/Output Summary (Last 24 hours) at 08/06/2020 1412 Last data filed  at 08/06/2020 1330 Gross per 24 hour  Intake 634 ml  Output 2400 ml  Net -1766 ml   Filed Weights   08/04/20 1539 08/05/20 0437 08/06/20 0353  Weight: (!) 139 kg (!) 142.9 kg (!) 141.7 kg    Examination:  eomi ncat cta b no added sound abd soft nt nd  S1-S2 no murmur nsr with sinus brady Neurologically intact no deficit power reflexes  Data Reviewed: I have personally reviewed following labs and imaging studies Bun/creat 20/1.2-->19/1.41 Hemoglobin 7.8, wbc 8.8   Radiology Studies: ECHOCARDIOGRAM COMPLETE  Result Date: 08/05/2020    ECHOCARDIOGRAM REPORT   Patient Name:   Aaron Ball Date of Exam:  08/04/2020 Medical Rec #:  [id number]          Height:       67.0 in Accession #:    [id number]         Weight:       306.4 lb Date of Birth:  1951/07/26         BSA:          2.424 m Patient Age:    68 years           BP:           151/75 mmHg Patient Gender: M                  HR:           65 bpm. Exam Location:  [facility] Procedure: 2D Echo, Cardiac Doppler, Color Doppler and Intracardiac            Opacification Agent Indications:     I50.31 Acute Diastolic CHF  History:         Patient has prior history of Echocardiogram examinations, most                  recent 09/04/2019. Risk Factors:Hypertension, Diabetes,                  Dyslipidemia and Morbid Obesity. Obstructive sleep apnea.                  Coronary artery disease. COPD. Atrial Fibrillation.  Sonographer:     Sedonia Small Rodgers-Jones Referring Phys:  [id number] GYJEH NIU Diagnosing Phys: Alwyn Pea MD  Sonographer Comments: Technically difficult study due to poor echo windows and patient is morbidly obese. IMPRESSIONS  1. Left ventricular ejection fraction, by estimation, is 60 to 65%. The left ventricle has normal function. The left ventricle has no regional wall motion abnormalities. Left ventricular diastolic parameters were normal.  2. Right ventricular systolic function is normal. The right ventricular size is normal.  3. The mitral valve is normal in structure. No evidence of mitral valve regurgitation.  4. The aortic valve is grossly normal. Aortic valve regurgitation is not visualized. FINDINGS  Left Ventricle: Left ventricular ejection fraction, by estimation, is 60 to 65%. The left ventricle has normal function. The left ventricle has no regional wall motion abnormalities. Definity contrast agent was given IV to delineate the left ventricular  endocardial borders. The left ventricular internal cavity size was normal in size. There is no left ventricular hypertrophy. Left ventricular diastolic parameters were normal. Right Ventricle: The right  ventricular size is normal. No increase in right ventricular wall thickness. Right ventricular systolic function is normal. Left Atrium: Left atrial size was normal in size. Right Atrium: Right atrial size was normal in size. Pericardium: There is no evidence of pericardial  effusion. Mitral Valve: The mitral valve is normal in structure. No evidence of mitral valve regurgitation. Tricuspid Valve: The tricuspid valve is normal in structure. Tricuspid valve regurgitation is trivial. Aortic Valve: The aortic valve is grossly normal. Aortic valve regurgitation is not visualized. Pulmonic Valve: The pulmonic valve was normal in structure. Pulmonic valve regurgitation is not visualized. Aorta: The ascending aorta was not well visualized. IAS/Shunts: No atrial level shunt detected by color flow Doppler.  LEFT VENTRICLE PLAX 2D LVIDd:         4.40 cm  Diastology LVIDs:         2.93 cm  LV e' medial:    6.85 cm/s LV PW:         0.99 cm  LV E/e' medial:  16.9 LV IVS:        0.91 cm  LV e' lateral:   9.46 cm/s LVOT diam:     2.00 cm  LV E/e' lateral: 12.3 LV SV:         72 LV SV Index:   30 LVOT Area:     3.14 cm  RIGHT VENTRICLE             IVC RV Basal diam:  4.87 cm     IVC diam: 1.98 cm RV S prime:     17.25 cm/s LEFT ATRIUM             Index       RIGHT ATRIUM           Index LA diam:        4.10 cm 1.69 cm/m  RA Area:     20.40 cm LA Vol (A2C):   76.2 ml 31.43 ml/m RA Volume:   69.00 ml  28.46 ml/m LA Vol (A4C):   82.7 ml 34.11 ml/m LA Biplane Vol: 81.0 ml 33.41 ml/m  AORTIC VALVE LVOT Vmax:   118.00 cm/s LVOT Vmean:  78.300 cm/s LVOT VTI:    0.228 m  AORTA Ao Root diam: 3.60 cm MITRAL VALVE MV Area (PHT): 3.91 cm     SHUNTS MV Decel Time: 194 msec     Systemic VTI:  0.23 m MV E velocity: 116.00 cm/s  Systemic Diam: 2.00 cm MV A velocity: 55.30 cm/s MV E/A ratio:  2.10 Dwayne D Callwood MD Electronically signed by Alwyn Peawayne D Callwood MD Signature Date/Time: 08/05/2020/4:21:48 PM    Final      Scheduled Meds: .  allopurinol  100 mg Oral Daily  . allopurinol  300 mg Oral Daily  . atorvastatin  40 mg Oral QPM  . diphenhydrAMINE  25 mg Oral Once  . ferrous sulfate  325 mg Oral BID WC  . furosemide  20 mg Intravenous Once  . furosemide  40 mg Intravenous Q12H  . gabapentin  900 mg Oral QHS  . insulin aspart  0-5 Units Subcutaneous QHS  . insulin aspart  0-9 Units Subcutaneous TID WC  . ipratropium-albuterol  3 mL Nebulization BID  . lisinopril  10 mg Oral Daily  . pantoprazole  40 mg Oral BID  . sodium chloride flush  3 mL Intravenous Q12H  . sotalol  160 mg Oral BID   Continuous Infusions: . sodium chloride       LOS: 2 days    Time spent: 20  Rhetta MuraJai-Gurmukh Javin Nong, MD [facility] To contact the attending provider between 7A-7P or the covering provider during after hours 7P-7A, please log into the web site [email or web address] and access using universal  [place] password for that web site. If you do not have the password, please call the hospital operator.  08/06/2020, 2:12 PM

## 2020-08-06 NOTE — Hospital Course (Signed)
Timeline Admission 12/22 through 09/12/2019 with a flutter status post DCCV placed on Eliquis Admission 1/26 through 10/09/2015 pleuritic chest pain PE ruled out Admission 12/7 through 08/21/2015 AKI hyperkalemia Admission 6/8 through 02/18/2013 with stable angina

## 2020-08-07 DIAGNOSIS — J9621 Acute and chronic respiratory failure with hypoxia: Secondary | ICD-10-CM | POA: Diagnosis not present

## 2020-08-07 LAB — COMPREHENSIVE METABOLIC PANEL
ALT: 11 U/L (ref 0–44)
AST: 14 U/L — ABNORMAL LOW (ref 15–41)
Albumin: 3.6 g/dL (ref 3.5–5.0)
Alkaline Phosphatase: 107 U/L (ref 38–126)
Anion gap: 12 (ref 5–15)
BUN: 26 mg/dL — ABNORMAL HIGH (ref 8–23)
CO2: 34 mmol/L — ABNORMAL HIGH (ref 22–32)
Calcium: 8.2 mg/dL — ABNORMAL LOW (ref 8.9–10.3)
Chloride: 97 mmol/L — ABNORMAL LOW (ref 98–111)
Creatinine, Ser: 1.76 mg/dL — ABNORMAL HIGH (ref 0.61–1.24)
GFR, Estimated: 41 mL/min — ABNORMAL LOW (ref >60)
Glucose, Bld: 96 mg/dL (ref 70–99)
Potassium: 4 mmol/L (ref 3.5–5.1)
Sodium: 143 mmol/L (ref 135–145)
Total Bilirubin: 0.7 mg/dL (ref 0.3–1.2)
Total Protein: 7.3 g/dL (ref 6.5–8.1)

## 2020-08-07 LAB — CBC WITH DIFFERENTIAL/PLATELET
Abs Immature Granulocytes: 0.02 10*3/uL (ref 0.00–0.07)
Basophils Absolute: 0.1 10*3/uL (ref 0.0–0.1)
Basophils Relative: 1 %
Eosinophils Absolute: 0.4 10*3/uL (ref 0.0–0.5)
Eosinophils Relative: 4 %
HCT: 30.9 % — ABNORMAL LOW (ref 39.0–52.0)
Hemoglobin: 8.5 g/dL — ABNORMAL LOW (ref 13.0–17.0)
Immature Granulocytes: 0 %
Lymphocytes Relative: 12 %
Lymphs Abs: 1.2 10*3/uL (ref 0.7–4.0)
MCH: 24 pg — ABNORMAL LOW (ref 26.0–34.0)
MCHC: 27.5 g/dL — ABNORMAL LOW (ref 30.0–36.0)
MCV: 87.3 fL (ref 80.0–100.0)
Monocytes Absolute: 1.2 10*3/uL — ABNORMAL HIGH (ref 0.1–1.0)
Monocytes Relative: 12 %
Neutro Abs: 7.1 10*3/uL (ref 1.7–7.7)
Neutrophils Relative %: 71 %
Platelets: 296 10*3/uL (ref 150–400)
RBC: 3.54 MIL/uL — ABNORMAL LOW (ref 4.22–5.81)
RDW: 18.8 % — ABNORMAL HIGH (ref 11.5–15.5)
WBC: 10 10*3/uL (ref 4.0–10.5)
nRBC: 0.2 % (ref 0.0–0.2)

## 2020-08-07 LAB — GLUCOSE, CAPILLARY
Glucose-Capillary: 91 mg/dL (ref 70–99)
Glucose-Capillary: 103 mg/dL — ABNORMAL HIGH (ref 70–99)
Glucose-Capillary: 96 mg/dL (ref 70–99)
Glucose-Capillary: 128 mg/dL — ABNORMAL HIGH (ref 70–99)

## 2020-08-07 MED ORDER — LISINOPRIL 5 MG PO TABS
2.5000 mg | ORAL_TABLET | Freq: Every day | ORAL | Status: DC
  Administered 2020-08-07 – 2020-08-08 (×2): 2.5 mg via ORAL
  Filled 2020-08-07 (×2): qty 1

## 2020-08-07 MED ORDER — FUROSEMIDE 20 MG PO TABS
20.0000 mg | ORAL_TABLET | Freq: Two times a day (BID) | ORAL | Status: DC
  Administered 2020-08-07 – 2020-08-08 (×2): 20 mg via ORAL
  Filled 2020-08-07: qty 1

## 2020-08-07 MED ORDER — FUROSEMIDE 20 MG PO TABS
20.0000 mg | ORAL_TABLET | Freq: Once | ORAL | Status: DC
  Filled 2020-08-07: qty 1

## 2020-08-07 NOTE — Plan of Care (Signed)
  Problem: Cardiac: Goal: Ability to achieve and maintain adequate cardiopulmonary perfusion will improve Outcome: Progressing   Problem: Education: Goal: Knowledge of General Education information will improve Description: Including pain rating scale, medication(s)/side effects and non-pharmacologic comfort measures Outcome: Progressing   Problem: Education: Goal: Ability to demonstrate management of disease process will improve Outcome: Progressing   Problem: Pain Managment: Goal: General experience of comfort will improve Outcome: Progressing   Problem: Coping: Goal: Level of anxiety will decrease Outcome: Progressing   Problem: Activity: Goal: Risk for activity intolerance will decrease Outcome: Progressing

## 2020-08-07 NOTE — Plan of Care (Signed)
Pt had improvments today was ablt to ambulate to bathroom and take a wash up. Pt was more alert today and less drowsy, he is looking forward to discharge tom. Did notice a yeast type rash in groin area applied anti fungal powder  Problem: Education: Goal: Ability to demonstrate management of disease process will improve Outcome: Progressing Goal: Ability to verbalize understanding of medication therapies will improve Outcome: Progressing Goal: Individualized Educational Video(s) Outcome: Progressing   Problem: Activity: independent  Goal: Capacity to carry out activities will improve Outcome: Progressing   Problem: Cardiac: Goal: Ability to achieve and maintain adequate cardiopulmonary perfusion will improve Outcome: Progressing   Problem: Education: Pt verbalize understanding of limiting fluid intake  Goal: Knowledge of General Education information will improve Description: Including pain rating scale, medication(s)/side effects and non-pharmacologic comfort measures Outcome: Progressing   Problem: Health Behavior/Discharge Planning: Goal: Ability to manage health-related needs will improve Outcome: Progressing   Problem: Clinical Measurements: Goal: Ability to maintain clinical measurements within normal limits will improve Outcome: Progressing Goal: Will remain free from infection Outcome: Progressing Goal: Diagnostic test results will improve Outcome: Progressing Goal: Respiratory complications will improve Outcome: Progressing Goal: Cardiovascular complication will be avoided Outcome: Progressing   Problem: Activity: Goal: Risk for activity intolerance will decrease Outcome: Progressing   Problem: Nutrition: Goal: Adequate nutrition will be maintained Outcome: Progressing   Problem: Coping: Goal: Level of anxiety will decrease Outcome: Progressing   Problem: Elimination: Goal: Will not experience complications related to bowel motility Outcome:  Progressing Goal: Will not experience complications related to urinary retention Outcome: Progressing   Problem: Pain Managment: Goal: General experience of comfort will improve Outcome: Progressing   Problem: Safety: Goal: Ability to remain free from injury will improve Outcome: Progressing   Problem: Skin Integrity: Goal: Risk for impaired skin integrity will decrease Outcome: Progressing   Problem: Education: Goal: Knowledge of disease or condition will improve Outcome: Progressing Goal: Knowledge of the prescribed therapeutic regimen will improve Outcome: Progressing Goal: Individualized Educational Video(s) Outcome: Progressing   Problem: Activity: Goal: Ability to tolerate increased activity will improve Outcome: Progressing Goal: Will verbalize the importance of balancing activity with adequate rest periods Outcome: Progressing   Problem: Respiratory: Goal: Ability to maintain a clear airway will improve Outcome: Progressing Goal: Levels of oxygenation will improve Outcome: Progressing Goal: Ability to maintain adequate ventilation will improve Outcome: Progressing

## 2020-08-07 NOTE — TOC Initial Note (Signed)
Transition of Care [facility]) - Initial/Assessment Note    Patient Details  Name: Aaron Ball MRN: [id number] Date of Birth: 03-14-1951  [facility]) CM/SW Contact:    Chapman Fitch, RN Phone Number: 08/07/2020, 3:42 PM  Clinical Narrative:                 Patient admitted from home with respiratory failure  Patient lives at home alone Daughter and son live locally for support. Children provide transportation to appointments.   PCP [facility] Denies issues obtaining medications  Wears 2 L O2 at home.  Does not recall the company his oxygen is with, does confirm he has portable O2.  Patient confirms he has a scale in the home to monitor daily weights.   Patient has RW, rolaltor, cane, and toliet riser  PT has assessed patient and recommends home health PT.  Patient agreeable to services and request that I call his daughter to determine if there is a preference of home health agency  Per daughter they do not have a preference of agency.  Confirms they have had [facility] in the past and were happy with their services.  Referral made and accepted by Erin with [facility]   Expected Discharge Plan: Home w Home Health Services Barriers to Discharge: Continued Medical Work up   Patient Goals and CMS Choice        Expected Discharge Plan and Services Expected Discharge Plan: Home w Home Health Services       Living arrangements for the past 2 months: Single Family Home                           HH Arranged: RN, PT, OT [facility] Agency: [facility] Date HH Agency Contacted: 08/07/20   Representative spoke with at [facility] Agency: Denny Peon  Prior Living Arrangements/Services Living arrangements for the past 2 months: Single Family Home Lives with:: Self Patient language and need for interpreter reviewed:: Yes Do you feel safe going back to the place where you live?: Yes      Need for Family Participation in Patient  Care: Yes (Comment) Care giver support system in place?: Yes (comment) Current home services: DME Criminal Activity/Legal Involvement Pertinent to Current Situation/Hospitalization: No - Comment as needed  Activities of Daily Living Home Assistive Devices/Equipment: Bedside commode/3-in-1, Walker (specify type), Oxygen ADL Screening (condition at time of admission) Patient's cognitive ability adequate to safely complete daily activities?: Yes Is the patient deaf or have difficulty hearing?: Yes Does the patient have difficulty seeing, even when wearing glasses/contacts?: No Does the patient have difficulty concentrating, remembering, or making decisions?: No Patient able to express need for assistance with ADLs?: Yes Does the patient have difficulty dressing or bathing?: Yes Independently performs ADLs?: Yes (appropriate for developmental age) Does the patient have difficulty walking or climbing stairs?: Yes Weakness of Legs: Both Weakness of Arms/Hands: None  Permission Sought/Granted                  Emotional Assessment       Orientation: : Oriented to Self, Oriented to Place, Oriented to  Time, Oriented to Situation   Psych Involvement: No (comment)  Admission diagnosis:  Acute on chronic diastolic CHF (congestive heart failure) (HCC) [I50.33] Acute on chronic combined systolic and diastolic CHF (congestive heart failure) (HCC) [I50.43] Symptomatic anemia [D64.9] Acute on chronic respiratory failure with hypoxemia (HCC) [J96.21] Acute on  chronic respiratory failure with hypoxia and hypercapnia (HCC) [J96.21, J96.22] Dyspnea, unspecified type [R06.00] Patient Active Problem List   Diagnosis Date Noted  . Acute on chronic respiratory failure with hypoxemia (HCC) 08/04/2020  . Acute on chronic diastolic CHF (congestive heart failure) (HCC) 08/04/2020  . Symptomatic anemia 08/04/2020  . Atrial fibrillation, chronic (HCC) 08/04/2020  . Iron deficiency anemia 08/04/2020   . Atrial flutter (HCC) 09/05/2019  . Pulmonary edema 09/05/2019  . Morbid obesity (HCC) 09/05/2019  . Atrial tachycardia (HCC) 09/03/2019  . OSA (obstructive sleep apnea) 09/03/2019  . COPD (chronic obstructive pulmonary disease) (HCC) 09/03/2019  . Chronic back pain 09/03/2019  . Gout 09/03/2019  . Diabetes mellitus without complication (HCC) 09/03/2019  . Allergic rhinitis 09/03/2019  . Chest pain, central 10/08/2015  . AKI (acute kidney injury) (HCC) 08/19/2015  . CAD (coronary artery disease) 06/28/2011  . Obesity 06/28/2011  . Hyperlipidemia 06/28/2011  . HTN (hypertension) 06/28/2011   PCP:  [facility]:   [facility] [address]), [place] - [address] [place]) [place] [address] Phone: [phone] Fax: [phone]  [facility] [address] [place], [place] - [address] [place] [address] Phone: [phone] Fax: [phone]     Social Determinants of Health (SDOH) Interventions    Readmission Risk Interventions Readmission Risk Prevention Plan 08/07/2020  Transportation Screening Complete  HRI or Home Care Consult Complete  Palliative Care Screening Not Applicable  Medication Review (RN Care Manager) Complete  Some recent data might be hidden

## 2020-08-07 NOTE — Progress Notes (Signed)
PROGRESS NOTE   Aaron Ball  HKV:[id number] DOB: 1951-07-25 DOA: 08/04/2020 PCP: [facility]  Brief Narrative:  69 year old white male COPD/2 L of oxygen [for the past couple weeks from PCP], OSA, CAD s/p PCI 1997 RCA-nonobstructive disease on catheter 2015 atrial fibrillation unsuccessful DCCV X2 on Coumadin-[facility] cardi[facility] Dr. Angelita Ingles, gout HTN HLD DM TY 2  Admit 08/04/2020 SOB, DOE initial O2 sat 89% increased to 4 L-CXR showed pulmonary edema-blood work revealed hemoglobin 6.1 (baseline 02/13/2020 8.9-)-Rx 2 units PRBC in ED, given Feraheme in addition  Assessment & Plan:   Principal Problem:   Acute on chronic respiratory failure with hypoxemia (HCC) Active Problems:   CAD (coronary artery disease)   Hyperlipidemia   HTN (hypertension)   COPD (chronic obstructive pulmonary disease) (HCC)   Gout   Diabetes mellitus without complication (HCC)   Acute on chronic diastolic CHF (congestive heart failure) (HCC)   Symptomatic anemia   Atrial fibrillation, chronic (HCC)   Iron deficiency anemia   1. Acute hypoxic respiratory failure a. Secondary to acute exacerbation HFpEF in the setting of low hemoglobin and probably high-output heart failure b. Last EF 45 to 60% 08/2019 BNP 300- c. Change diuresis from IV to p.o. 20 twice daily 11/26 although held diuretics 11/20 5 PM d. - baseline weight varies 319-329 lbs he is currently at 310 e. Nearing euvolemia and developing mild contraction alkalosis--likely d/c in am 2. ?  GI bleed with negative FOBT 3. Has some nasal bleeding at times 4. Probably causing high-output heart failure a. Transfused 2 unit PRBC 11/24-given Feraheme x1 on 11/23 continue ferrous sulfate 325 twice daily b. Hemoglobin has stabilized c. OP consideration for colonoscopy-tells me last 1 was "normal" --has been doing cologaurd for several yrs d. May benefit from having Feraheme in addition with iron studies in the outpatient setting in  several weeks 5. Underlying COPD baseline 2 L oxygen at home a. Has been on oxygen "for few weeks" b. Continue albuterol every 4 as needed DuoNeb 3 times daily 6. CAD PCI 1997 7. A. fib CHA2DS2-VASc >4 on Eliquis a. Continue lisinopril 10 daily, sotalol 160 twice daily, atorvastatin 40 daily-remains in sinus rhythm predominantly 8. DM TY 2 A1c recently 6.9 a. eating 890% meals, sugars controlled 90s to 100s b. Continue sliding scale cbg  c. Resume glipizide 5 on discharge 9. Gout a. Continue colchicine 0.6  DVT prophylaxis: Eliquis Code Status: Full Family Communication: Called son [phone] to update 11/25--- no family present today Disposition:  Status is: Inpatient  Remains inpatient appropriate because:Ongoing active pain requiring inpatient pain management and IV treatments appropriate due to intensity of illness or inability to take PO   Dispo: The patient is from: Home              Anticipated d/c is to: Home              Anticipated d/c date is: 1 day              Patient currently is not medically stable to d/c.   Consultants:     Procedures:   Antimicrobials:     Subjective:  Awake coherent looks stronger breathing better and is able to ambulate to the restroom without issue No chest pain no fever still feels swollen Asking to have stockings put on legs Eating and drinking well  Objective: Vitals:   08/06/20 2020 08/07/20 0421 08/07/20 0748 08/07/20 1220  BP: (!) 106/93 (!) 102/56 99/64 (!) 96/37  Pulse: 63 (!) 57 (!) 58 (!) 55  Resp: 19 20 18 19   Temp: 98.2 F (36.8 C) 98.2 F (36.8 C) 97.6 F (36.4 C) 98.2 F (36.8 C)  TempSrc: Oral Oral Oral   SpO2: 98% 91% 93% 97%  Weight:  (!) 141.6 kg    Height:        Intake/Output Summary (Last 24 hours) at 08/07/2020 1456 Last data filed at 08/07/2020 1010 Gross per 24 hour  Intake 840 ml  Output 0 ml  Net 840 ml   Filed Weights   08/05/20 0437 08/06/20 0353 08/07/20 0421  Weight: (!) 142.9 kg (!)  141.7 kg (!) 141.6 kg    Examination:  Awake coherent thick neck Mallampati 4 Chest clear cannot appreciate JVD given habitus Abdomen soft no rebound no guarding 1+ lower extremity pitting edema with venous stasis changes Neurologically intact no focal deficit Abdomen obese nontender no rebound  Data Reviewed: I have personally reviewed following labs and imaging studies Bun/creat 20/1.2-->19/1.41--26/1.7 Hemoglobin 7.8-->8.5, wbc 10   Radiology Studies: No results found.   Scheduled Meds: . allopurinol  100 mg Oral Daily  . allopurinol  300 mg Oral Daily  . atorvastatin  40 mg Oral QPM  . diphenhydrAMINE  25 mg Oral Once  . ferrous sulfate  325 mg Oral BID WC  . gabapentin  900 mg Oral QHS  . insulin aspart  0-5 Units Subcutaneous QHS  . insulin aspart  0-9 Units Subcutaneous TID WC  . ipratropium-albuterol  3 mL Nebulization BID  . lisinopril  2.5 mg Oral Daily  . pantoprazole  40 mg Oral BID  . sodium chloride flush  3 mL Intravenous Q12H  . sotalol  160 mg Oral BID   Continuous Infusions: . sodium chloride       LOS: 3 days    Time spent: 20  10-18-1975, MD [facility] To contact the attending provider between 7A-7P or the covering provider during after hours 7P-7A, please log into the web site [email or web address] and access using universal [place] password for that web site. If you do not have the password, please call the hospital operator.  08/07/2020, 2:56 PM

## 2020-08-07 NOTE — Progress Notes (Signed)
   Heart failure nurse navigator note  HFpEF 55 to 60% by echocardiogram performed in December 2020.  Echocardiogram performed on this admission ejection fraction is 60 to 65%.  Regional wall motion abnormalities.  Normal right ventricular systolic function.   Comorbidities:  Hypertension Hyperlipidemia Diabetes COPD Obstructive sleep apnea Coronary artery disease with stenting to the RCA Atrial fibrillation status post ablation Gout Iron deficiency anemia Morbid obesity  Medication:  Atorvastatin 40 mg daily Lasix 20 mg IV once daily Lisinopril 2.5 mg daily Betapace 160 mg twice a day   Labs:  Sodium 143, potassium 4.0, BUN 26, creatinine 1.76, hemoglobin 8.5 Intake 840 mL Output 500 mL Weight 141.6 kg Blood pressure 99/64 BMI 48.88   Assessment:  General he is awake and alert sitting up in the chair in no acute distress.  HEENT-pupils are equal, unable to assess JVD due to body habitus.  Hard hearing with aid in the right ear.  Cardiac-heart tones are regular rate and rhythm  Chest-breath sounds are clear to posterior auscultation  Abdomen- obese, soft non tender, bowel sounds.   Musculoskeletal-2+ edema lower extremities and skin discoloration significant stasis.  Psych is pleasant and appropriate makes good eye contact  Neurologic moves all extremities without difficulty, speech is clear.   Discussed heart failure with the patient this morning while he was sitting up in the chair.  He states that he found the video was very helpful, by teach back method discussed daily weight and what to report.  Foods to avoid, so discussed being cautious with substitute that contains potassium chloride.  Also discussed use of support hose, so made him aware that there are support hose that have a zipper which makes them on easier.  Also discussed elevating his legs as much as possible.   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

## 2020-08-07 NOTE — Care Management Important Message (Signed)
Important Message  Patient Details  Name: Aaron Ball MRN: [id number] Date of Birth: [birth year]   Medicare Important Message Given:  Yes     [name] 08/07/2020, 1:52 PM

## 2020-08-08 DIAGNOSIS — J9621 Acute and chronic respiratory failure with hypoxia: Secondary | ICD-10-CM | POA: Diagnosis not present

## 2020-08-08 LAB — COMPREHENSIVE METABOLIC PANEL
ALT: 10 U/L (ref 0–44)
AST: 13 U/L — ABNORMAL LOW (ref 15–41)
Albumin: 3.4 g/dL — ABNORMAL LOW (ref 3.5–5.0)
Alkaline Phosphatase: 104 U/L (ref 38–126)
Anion gap: 10 (ref 5–15)
BUN: 35 mg/dL — ABNORMAL HIGH (ref 8–23)
CO2: 33 mmol/L — ABNORMAL HIGH (ref 22–32)
Calcium: 8.6 mg/dL — ABNORMAL LOW (ref 8.9–10.3)
Chloride: 98 mmol/L (ref 98–111)
Creatinine, Ser: 1.87 mg/dL — ABNORMAL HIGH (ref 0.61–1.24)
GFR, Estimated: 38 mL/min — ABNORMAL LOW (ref >60)
Glucose, Bld: 106 mg/dL — ABNORMAL HIGH (ref 70–99)
Potassium: 4 mmol/L (ref 3.5–5.1)
Sodium: 141 mmol/L (ref 135–145)
Total Bilirubin: 0.7 mg/dL (ref 0.3–1.2)
Total Protein: 7.2 g/dL (ref 6.5–8.1)

## 2020-08-08 LAB — CBC WITH DIFFERENTIAL/PLATELET
Abs Immature Granulocytes: 0.03 10*3/uL (ref 0.00–0.07)
Basophils Absolute: 0.1 10*3/uL (ref 0.0–0.1)
Basophils Relative: 1 %
Eosinophils Absolute: 0.5 10*3/uL (ref 0.0–0.5)
Eosinophils Relative: 5 %
HCT: 29.2 % — ABNORMAL LOW (ref 39.0–52.0)
Hemoglobin: 7.9 g/dL — ABNORMAL LOW (ref 13.0–17.0)
Immature Granulocytes: 0 %
Lymphocytes Relative: 13 %
Lymphs Abs: 1.2 10*3/uL (ref 0.7–4.0)
MCH: 23.7 pg — ABNORMAL LOW (ref 26.0–34.0)
MCHC: 27.1 g/dL — ABNORMAL LOW (ref 30.0–36.0)
MCV: 87.4 fL (ref 80.0–100.0)
Monocytes Absolute: 0.8 10*3/uL (ref 0.1–1.0)
Monocytes Relative: 9 %
Neutro Abs: 6.7 10*3/uL (ref 1.7–7.7)
Neutrophils Relative %: 72 %
Platelets: 308 10*3/uL (ref 150–400)
RBC: 3.34 MIL/uL — ABNORMAL LOW (ref 4.22–5.81)
RDW: 19.4 % — ABNORMAL HIGH (ref 11.5–15.5)
WBC: 9.2 10*3/uL (ref 4.0–10.5)
nRBC: 0 % (ref 0.0–0.2)

## 2020-08-08 LAB — MAGNESIUM: Magnesium: 2.3 mg/dL (ref 1.7–2.4)

## 2020-08-08 LAB — GLUCOSE, CAPILLARY: Glucose-Capillary: 161 mg/dL — ABNORMAL HIGH (ref 70–99)

## 2020-08-08 MED ORDER — FERROUS SULFATE 325 (65 FE) MG PO TABS: 325.0000 mg | ORAL_TABLET | Freq: Two times a day (BID) | ORAL | 3 refills | Status: AC

## 2020-08-08 MED ORDER — LISINOPRIL 2.5 MG PO TABS: 2.5000 mg | ORAL_TABLET | Freq: Every day | ORAL | 1 refills | Status: AC

## 2020-08-08 MED ORDER — SALINE SPRAY 0.65 % NA SOLN
1.0000 | NASAL | Status: DC | PRN
  Filled 2020-08-08: qty 44

## 2020-08-08 MED ORDER — ALBUTEROL SULFATE HFA 108 (90 BASE) MCG/ACT IN AERS: 2.0000 | INHALATION_SPRAY | Freq: Four times a day (QID) | RESPIRATORY_TRACT | 2 refills | Status: AC | PRN

## 2020-08-08 MED ORDER — DM-GUAIFENESIN ER 30-600 MG PO TB12: 1.0000 | ORAL_TABLET | Freq: Two times a day (BID) | ORAL | 0 refills | Status: AC | PRN

## 2020-08-08 MED ORDER — PANTOPRAZOLE SODIUM 40 MG PO TBEC
40.0000 mg | DELAYED_RELEASE_TABLET | Freq: Two times a day (BID) | ORAL | 3 refills | Status: DC
Start: 2020-08-08 — End: 2021-11-11

## 2020-08-08 MED ORDER — FUROSEMIDE 20 MG PO TABS: 20.0000 mg | ORAL_TABLET | Freq: Two times a day (BID) | ORAL | 11 refills | Status: AC

## 2020-08-08 MED ORDER — SALINE SPRAY 0.65 % NA SOLN: 1.0000 | NASAL | 0 refills | Status: AC | PRN

## 2020-08-08 NOTE — TOC Progression Note (Addendum)
Transition of Care [facility]) - Progression Note    Patient Details  Name: Aaron Ball MRN: [id number] Date of Birth: May 03, 1951  [facility]) CM/SW Contact  Bing Quarry, RN Phone Number: 08/08/2020, 12:04 PM  Clinical Narrative:    Patient is discharging today with HH PT and Home oxygen. Has some oxygen via [facility].Spoke with patient regarding home services and attempting to contact to ensure knowledge of discharge plans--spoke with answering service and will try to get a return call but message left regarding discharge. [employer] willing to supply transport oxygen complimentary in order to be able to get patient home today. Jason notified at [facility]. Pt has other DME needs at home. Will continue to monitor. Gabriel Cirri RN    Expected Discharge Plan: Home w Home Health Services Barriers to Discharge: Continued Medical Work up  Expected Discharge Plan and Services Expected Discharge Plan: Home w Home Health Services       Living arrangements for the past 2 months: Single Family Home Expected Discharge Date: 08/08/20                         HH Arranged: RN, PT, OT HH Agency: [facility] Date HH Agency Contacted: 08/07/20   Representative spoke with at [facility] Agency: Denny Peon   Social Determinants of Health (SDOH) Interventions    Readmission Risk Interventions Readmission Risk Prevention Plan 08/07/2020  Transportation Screening Complete  HRI or Home Care Consult Complete  Palliative Care Screening Not Applicable  Medication Review (RN Care Manager) Complete  Some recent data might be hidden

## 2020-08-08 NOTE — Discharge Summary (Signed)
Physician Discharge Summary  Aaron Ball TOI:[id number] DOB: Dec 20, 1950 DOA: 08/04/2020  PCP: [facility]  Admit date: 08/04/2020 Discharge date: 08/08/2020  Time spent: 37 minutes  Recommendations for Outpatient Follow-up:  1. Discontinued aspirin temporarily-May require IV iron in addition to oral iron-we will require outpatient coordination with primary care to get gastroenterology involved for nonemergent colonoscopy 2. Home health PT ordered in addition to portable oxygen 2 L to confirm-needs daily weights and extra dose of Lasix discussed with patient on discharge if he gains more than 2 to 3 pounds over a 24-hour period of time 3. Continue current diuretics and adjust as an outpatient depending on labs within 1 week specifically Chem-12  Discharge Diagnoses:  Principal Problem:   Acute on chronic respiratory failure with hypoxemia (HCC) Active Problems:   CAD (coronary artery disease)   Hyperlipidemia   HTN (hypertension)   COPD (chronic obstructive pulmonary disease) (HCC)   Gout   Diabetes mellitus without complication (HCC)   Acute on chronic diastolic CHF (congestive heart failure) (HCC)   Symptomatic anemia   Atrial fibrillation, chronic (HCC)   Iron deficiency anemia   Discharge Condition: Improved  Diet recommendation: Diabetic heart healthy  Filed Weights   08/06/20 0353 08/07/20 0421 08/08/20 0436  Weight: (!) 141.7 kg (!) 141.6 kg (!) 142.7 kg    History of present illness:  69 year old white male COPD/2 L of oxygen [for the past couple weeks from PCP], OSA, CAD s/p PCI 1997 RCA-nonobstructive disease on catheter 2015 atrial fibrillation unsuccessful 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 Angelita Ingles, gout HTN HLD DM TY 2  Admit 08/04/2020 SOB, DOE initial O2 sat 89% increased to 4 L-CXR showed pulmonary edema-blood work revealed hemoglobin 6.1 (baseline 02/13/2020 8.9-)-Rx 2 units PRBC in ED, given Feraheme in addition which is home  dosing continue the same  Hospital Course:  1. Acute hypoxic respiratory failure a. Secondary to acute exacerbation HFpEF in the setting of low hemoglobin and probably high-output heart failure b. Last EF 45 to 60% 08/2019 BNP 300- c. Change diuresis from IV to p.o. 20 twice daily 11/26  d. - baseline weight varies 319-329 lbs he is currently at 310--- this will be considered as his dry weight after this discharge 2. ?  GI bleed with negative FOBT 3. Has some nasal bleeding at times 4. Probably causing high-output heart failure a. Transfused 2 unit PRBC 11/24-given Feraheme x1 on 11/23 continue ferrous sulfate 325 twice daily b. Hemoglobin has stabilized c. OP consideration for colonoscopy gastroenterology and informed him to make sure he gets follow-up-tells me last 1 was "normal" --has been doing cologaurd for several yrs d. May benefit from having Feraheme in addition with iron studies in the outpatient setting in several weeks 5. Underlying COPD baseline 2 L oxygen at home a. Has been on oxygen "for few weeks" b. Continue albuterol every 4 as needed DuoNeb 3 times daily 6. CAD PCI 1997 7. A. fib CHA2DS2-VASc >4 on Eliquis a. Continue lisinopril 10 daily, sotalol 160 twice daily, atorvastatin 40 daily-remains in sinus rhythm predominantly 8. DM TY 2 A1c recently 6.9 a. sugars controlled 90s to 100s b. Continue sliding scale cbg  c. Resume glipizide 5 on discharge 9. Gout a. Continue colchicine 0.6   Discharge Exam: Vitals:   08/08/20 0726 08/08/20 0843  BP: 109/61   Pulse: (!) 55   Resp: 16   Temp: 97.8 F (36.6 C)   SpO2: 100% 96%    General: Thick neck  Mallampati 4 awake alert no distress EOMI NCAT no focal deficit seems much closer to his normal tells me that he is much better breathing is much better Cardiovascular: S1-S2 no murmur no rub no gallop RRR Monitors appears to have 4 beats of V. tach which was nonsustained Respiratory: No rales no rhonchi chest is  clear Abdomen soft chronic venous stasis Abdomen is very but nondistended no rebound no guarding  Discharge Instructions   Discharge Instructions    Diet - low sodium heart healthy   Complete by: As directed    Discharge instructions   Complete by: As directed    You were admitted this hospital stay with a low hemoglobin and we do not know the specific reason but this can be safely and not emergently worked up in the outpatient setting by a stomach doctor-my suggestion is that you get follow-up with your primary physician and get a referral back to your stomach doctor to see if there is any bleeding-this can be done with a colonoscopy  Because of your low hemoglobin you probably developed a little bit of stress on the heart which caused your heart to work harder and ultimately developed some heart failure My suggestion is to hold off on your aspirin at this time and monitor your weights as well as you feel over the next several weeks You will resume your oxygen-some of your blood pressure medications have been discontinued or changed dosing for example your lisinopril is at a lower dose so please watch your medications carefully you can continue your medications for your back pain please get checks on your Coumadin levels and follow-up with your general physician in several weeks  Your primary physician might feel that you need to get IV iron but you can continue oral iron until that time report any bloody appearing stool to your physician  In addition we will also call in an inhaler to help with your breathing although he may not need this long-term   Increase activity slowly   Complete by: As directed      Allergies as of 08/08/2020      Reactions   Erythromycin Hives, Anaphylaxis   Cyclobenzaprine Palpitations   Triggered a fib      Medication List    STOP taking these medications   aspirin 81 MG EC tablet     TAKE these medications   acetaminophen 500 MG tablet Commonly  known as: TYLENOL Take 1,000 mg by mouth every 4 (four) hours as needed for pain.   albuterol 108 (90 Base) MCG/ACT inhaler Commonly known as: VENTOLIN HFA Inhale 2 puffs into the lungs every 6 (six) hours as needed for wheezing or shortness of breath.   allopurinol 300 MG tablet Commonly known as: ZYLOPRIM Take 300 mg by mouth daily. (take with 100mg  tablet to equal 400mg  total dose)   allopurinol 100 MG tablet Commonly known as: ZYLOPRIM Take 100 mg by mouth daily. (take with 300mg  tablet to equal 400mg  total dose)   ALPRAZolam 0.25 MG tablet Commonly known as: XANAX Take 0.25 mg by mouth 2 (two) times daily as needed for anxiety or sleep.   atorvastatin 40 MG tablet Commonly known as: LIPITOR Take 40 mg by mouth every evening.   colchicine 0.6 MG tablet Take 0.6-1.2 mg by mouth daily as needed (gout flare).   dextromethorphan-guaiFENesin 30-600 MG 12hr tablet Commonly known as: MUCINEX DM Take 1 tablet by mouth 2 (two) times daily as needed for cough.   ferrous sulfate  325 (65 FE) MG tablet Take 1 tablet (325 mg total) by mouth 2 (two) times daily with a meal.   furosemide 20 MG tablet Commonly known as: LASIX Take 1 tablet (20 mg total) by mouth 2 (two) times daily. What changed: Another medication with the same name was added. Make sure you understand how and when to take each.   furosemide 20 MG tablet Commonly known as: Lasix Take 1 tablet (20 mg total) by mouth 2 (two) times daily. What changed: You were already taking a medication with the same name, and this prescription was added. Make sure you understand how and when to take each.   gabapentin 300 MG capsule Commonly known as: NEURONTIN Take 900 mg by mouth at bedtime.   glipiZIDE 5 MG 24 hr tablet Commonly known as: GLUCOTROL XL Take 5 mg by mouth daily.   lisinopril 2.5 MG tablet Commonly known as: ZESTRIL Take 1 tablet (2.5 mg total) by mouth daily. Start taking on: August 09, 2020 What changed:    medication strength  how much to take   nitroGLYCERIN 0.4 MG SL tablet Commonly known as: NITROSTAT Place 0.4 mg under the tongue every 5 (five) minutes as needed for chest pain.   pantoprazole 40 MG tablet Commonly known as: PROTONIX Take 1 tablet (40 mg total) by mouth 2 (two) times daily.   sodium chloride 0.65 % Soln nasal spray Commonly known as: OCEAN Place 1 spray into both nostrils as needed for congestion.   sotalol 80 MG tablet Commonly known as: BETAPACE Take 160 mg by mouth 2 (two) times daily.   traMADol 50 MG tablet Commonly known as: ULTRAM Take 50 mg by mouth every 8 (eight) hours as needed for moderate pain.   traZODone 50 MG tablet Commonly known as: DESYREL Take 50 mg by mouth at bedtime as needed for sleep.   warfarin 2.5 MG tablet Commonly known as: COUMADIN Take 2.5 mg by mouth See admin instructions. Take 1 tablet (2.5mg ) by mouth every Tuesday, Wednesday, Thursday, Saturday and Sunday and take 1 tablets (3.75mg) by mouth every Monday and Friday            Durable Medical Equipment  (From admission, onward)         Start     Ordered   08/08/20 0954  DME Oxygen  Once       Question Answer Comment  Length of Need 6 Months   Mode or (Route) Nasal cannula   Liters per Minute 2   Oxygen delivery system Gas      11 /27/21 1027         Allergies  Allergen Reactions  . Erythromycin Hives and Anaphylaxis  . Cyclobenzaprine Palpitations    Triggered a fib    Follow-up Information    [facility] Follow up on 08/18/2020.   Specialty: Cardiology Why: at 8:30am. Enter through the Medical Mall entrance Contact information: [address] [place] [phone]               The results of significant diagnostics from this hospitalization (including imaging, microbiology, ancillary and laboratory) are listed below for reference.    Significant  Diagnostic Studies: DG Chest Portable 1 View  Result Date: 08/04/2020 CLINICAL DATA:  Shortness of breath, eval for edema. EXAM: PORTABLE CHEST 1 VIEW COMPARISON:  February 13, 2020. FINDINGS: Similar mildly enlarged cardiac silhouette. Similar pulmonary vascular congestion. Mild interstitial thickening, most prominent at  the lateral lung bases. No visible pleural effusions or pneumothorax. No acute osseous abnormality. IMPRESSION: Similar mild cardiomegaly with pulmonary vascular congestion. Mild interstitial prominence, slightly more conspicuous than on the prior and suggestive of mild interstitial edema. Electronically Signed   By: Feliberto Harts MD   On: 08/04/2020 08:52   ECHOCARDIOGRAM COMPLETE  Result Date: 08/05/2020    ECHOCARDIOGRAM REPORT   Patient Name:   Aaron Ball Date of Exam: 08/04/2020 Medical Rec #:  [id number]          Height:       67.0 in Accession #:    [id number]         Weight:       306.4 lb Date of Birth:  1951-03-01         BSA:          2.424 m Patient Age:    68 years           BP:           151/75 mmHg Patient Gender: M                  HR:           65 bpm. Exam Location:  [facility] Procedure: 2D Echo, Cardiac Doppler, Color Doppler and Intracardiac            Opacification Agent Indications:     I50.31 Acute Diastolic CHF  History:         Patient has prior history of Echocardiogram examinations, most                  recent 09/04/2019. Risk Factors:Hypertension, Diabetes,                  Dyslipidemia and Morbid Obesity. Obstructive sleep apnea.                  Coronary artery disease. COPD. Atrial Fibrillation.  Sonographer:     Sedonia Small Rodgers-Jones Referring Phys:  [id number] UYQIH NIU Diagnosing Phys: Alwyn Pea MD  Sonographer Comments: Technically difficult study due to poor echo windows and patient is morbidly obese. IMPRESSIONS  1. Left ventricular ejection fraction, by estimation, is 60 to 65%. The left ventricle has normal function. The left ventricle has no  regional wall motion abnormalities. Left ventricular diastolic parameters were normal.  2. Right ventricular systolic function is normal. The right ventricular size is normal.  3. The mitral valve is normal in structure. No evidence of mitral valve regurgitation.  4. The aortic valve is grossly normal. Aortic valve regurgitation is not visualized. FINDINGS  Left Ventricle: Left ventricular ejection fraction, by estimation, is 60 to 65%. The left ventricle has normal function. The left ventricle has no regional wall motion abnormalities. Definity contrast agent was given IV to delineate the left ventricular  endocardial borders. The left ventricular internal cavity size was normal in size. There is no left ventricular hypertrophy. Left ventricular diastolic parameters were normal. Right Ventricle: The right ventricular size is normal. No increase in right ventricular wall thickness. Right ventricular systolic function is normal. Left Atrium: Left atrial size was normal in size. Right Atrium: Right atrial size was normal in size. Pericardium: There is no evidence of pericardial effusion. Mitral Valve: The mitral valve is normal in structure. No evidence of mitral valve regurgitation. Tricuspid Valve: The tricuspid valve is normal in structure. Tricuspid valve regurgitation is trivial. Aortic Valve: The aortic valve is grossly normal. Aortic valve regurgitation  is not visualized. Pulmonic Valve: The pulmonic valve was normal in structure. Pulmonic valve regurgitation is not visualized. Aorta: The ascending aorta was not well visualized. IAS/Shunts: No atrial level shunt detected by color flow Doppler.  LEFT VENTRICLE PLAX 2D LVIDd:         4.40 cm  Diastology LVIDs:         2.93 cm  LV e' medial:    6.85 cm/s LV PW:         0.99 cm  LV E/e' medial:  16.9 LV IVS:        0.91 cm  LV e' lateral:   9.46 cm/s LVOT diam:     2.00 cm  LV E/e' lateral: 12.3 LV SV:         72 LV SV Index:   30 LVOT Area:     3.14 cm  RIGHT  VENTRICLE             IVC RV Basal diam:  4.87 cm     IVC diam: 1.98 cm RV S prime:     17.25 cm/s LEFT ATRIUM             Index       RIGHT ATRIUM           Index LA diam:        4.10 cm 1.69 cm/m  RA Area:     20.40 cm LA Vol (A2C):   76.2 ml 31.43 ml/m RA Volume:   69.00 ml  28.46 ml/m LA Vol (A4C):   82.7 ml 34.11 ml/m LA Biplane Vol: 81.0 ml 33.41 ml/m  AORTIC VALVE LVOT Vmax:   118.00 cm/s LVOT Vmean:  78.300 cm/s LVOT VTI:    0.228 m  AORTA Ao Root diam: 3.60 cm MITRAL VALVE MV Area (PHT): 3.91 cm     SHUNTS MV Decel Time: 194 msec     Systemic VTI:  0.23 m MV E velocity: 116.00 cm/s  Systemic Diam: 2.00 cm MV A velocity: 55.30 cm/s MV E/A ratio:  2.10 Dwayne D Callwood MD Electronically signed by Alwyn Pea MD Signature Date/Time: 08/05/2020/4:21:48 PM    Final     Microbiology: Recent Results (from the past 240 hour(s))  Resp Panel by RT-PCR (Flu A&B, Covid) Nasopharyngeal Swab     Status: None   Collection Time: 08/04/20 10:16 AM   Specimen: Nasopharyngeal Swab; Nasopharyngeal(NP) swabs in vial transport medium  Result Value Ref Range Status   SARS Coronavirus 2 by RT PCR NEGATIVE NEGATIVE Final    Comment: (NOTE) SARS-CoV-2 target nucleic acids are NOT DETECTED.  The SARS-CoV-2 RNA is generally detectable in upper respiratory specimens during the acute phase of infection. The lowest concentration of SARS-CoV-2 viral copies this assay can detect is 138 copies/mL. A negative result does not preclude SARS-Cov-2 infection and should not be used as the sole basis for treatment or other patient management decisions. A negative result may occur with  improper specimen collection/handling, submission of specimen other than nasopharyngeal swab, presence of viral mutation(s) within the areas targeted by this assay, and inadequate number of viral copies(<138 copies/mL). A negative result must be combined with clinical observations, patient history, and  epidemiological information. The expected result is Negative.  Fact Sheet for Patients:  BloggerCourse.com  Fact Sheet for Healthcare Providers:  SeriousBroker.it  This test is no t yet approved or cleared by the [place] FDA and  has been authorized for detection and/or diagnosis of SARS-CoV-2 by FDA  under an Emergency Use Authorization (EUA). This EUA will remain  in effect (meaning this test can be used) for the duration of the COVID-19 declaration under Section 564(b)(1) of the Act, 21 U.S.C.section 360bbb-3(b)(1), unless the authorization is terminated  or revoked sooner.       Influenza A by PCR NEGATIVE NEGATIVE Final   Influenza B by PCR NEGATIVE NEGATIVE Final    Comment: (NOTE) The Xpert Xpress SARS-CoV-2/FLU/RSV plus assay is intended as an aid in the diagnosis of influenza from Nasopharyngeal swab specimens and should not be used as a sole basis for treatment. Nasal washings and aspirates are unacceptable for Xpert Xpress SARS-CoV-2/FLU/RSV testing.  Fact Sheet for Patients: BloggerCourse.comhttps://www.fda.gov/media/152166/download  Fact Sheet for Healthcare Providers: SeriousBroker.ithttps://www.fda.gov/media/152162/download  This test is not yet approved or cleared by the [place] FDA and has been authorized for detection and/or diagnosis of SARS-CoV-2 by FDA under an Emergency Use Authorization (EUA). This EUA will remain in effect (meaning this test can be used) for the duration of the COVID-19 declaration under Section 564(b)(1) of the Act, 21 U.S.C. section 360bbb-3(b)(1), unless the authorization is terminated or revoked.  Performed at [facility], [address] [place], [place] [address]      Labs: Basic Metabolic Panel: Recent Labs  Lab 08/04/20 0844 08/05/20 0215 08/06/20 0542 08/07/20 0439 08/08/20 0434  NA 138 141 141 143 141  K 4.6 4.1 3.6 4.0 4.0  CL 98 98 97* 97* 98  CO2 31 31 34* 34* 33*   GLUCOSE 122* 106* 96 96 106*  BUN 25* 20 19 26* 35*  CREATININE 1.11 1.20 1.41* 1.76* 1.87*  CALCIUM 8.8* 8.9 8.7* 8.2* 8.6*  MG  --  2.3  --   --  2.3   Liver Function Tests: Recent Labs  Lab 08/04/20 0844 08/06/20 0542 08/07/20 0439 08/08/20 0434  AST 13* 13* 14* 13*  ALT 9 9 11 10   ALKPHOS 113 109 107 104  BILITOT 0.6 0.8 0.7 0.7  PROT 7.3 7.2 7.3 7.2  ALBUMIN 3.6 3.6 3.6 3.4*   No results for input(s): LIPASE, AMYLASE in the last 168 hours. No results for input(s): AMMONIA in the last 168 hours. CBC: Recent Labs  Lab 08/04/20 0844 08/04/20 2024 08/05/20 0851 08/05/20 1344 08/06/20 0542 08/07/20 0439 08/08/20 0434  WBC 7.5   < > 7.7 7.4 8.8 10.0 9.2  NEUTROABS 5.8  --   --   --  6.3 7.1 6.7  HGB 6.1*   < > 7.1* 7.2* 7.8* 8.5* 7.9*  HCT 23.6*   < > 26.3* 27.0* 29.1* 30.9* 29.2*  MCV 84.3   < > 84.3 85.2 86.6 87.3 87.4  PLT 330   < > 295 302 299 296 308   < > = values in this interval not displayed.   Cardiac Enzymes: No results for input(s): CKTOTAL, CKMB, CKMBINDEX, TROPONINI in the last 168 hours. BNP: BNP (last 3 results) Recent Labs    09/03/19 0955 09/11/19 0816 08/04/20 0844  BNP 125.0* 36.0 301.5*    ProBNP (last 3 results) No results for input(s): PROBNP in the last 8760 hours.  CBG: Recent Labs  Lab 08/07/20 0749 08/07/20 1221 08/07/20 1656 08/07/20 1938 08/08/20 0728  GLUCAP 91 103* 96 128* 161*       Signed:  Rhetta MuraJai-Gurmukh Thad Osoria MD   [facility] 08/08/2020, 10:27 AM

## 2020-08-08 NOTE — TOC Transition Note (Signed)
Transition of Care [facility]) - CM/SW Discharge Note   Patient Details  Name: Aaron Ball MRN: [id number] Date of Birth: 1951/01/02  Transition of Care [facility]) CM/SW Contact:  Bing Quarry, RN Phone Number: 08/08/2020, 1:10 PM   Clinical Narrative:    Pt to be discharged today. Son is picking him up and CM spoke with him via phone to update situation. Home oxygen for transport to be supplied (complimentary) by [employer] and has arrived. Has home oxygen services via [facility] (intake number given by answering service after call back at [phone]) but unable to reach prior to discharge. Will notify of discharge and patient has contact info, but is already set up at home. HH services modified via verbal order to RN, PT, and OT per original rec notes. Provider approved change and [facility] notified. Unit RN updated and update CM as well. CM will sign off pending discharge. Gabriel Cirri RN CM    Final next level of care: Home w Home Health Services Barriers to Discharge: Barriers Resolved   Patient Goals and CMS Choice        Discharge Placement                       Discharge Plan and Services                DME Arranged: Oxygen (For transport. Has home oxygen via [facility]. Notified them as well.) DME Agency: Other - Comment (Via [employer] for complimentary transport oxygen. Unable to reach [facility]) Date DME Agency Contacted: 08/08/20 Time DME Agency Contacted: 1000 Representative spoke with at DME Agency: Lelon Mast HH Arranged: RN, PT, OT [facility] Agency: [facility] Date HH Agency Contacted: 08/08/20 Time HH Agency Contacted: [phone] Representative spoke with at [facility] Agency: Feliberto Gottron (Updated discharge orders from [facility] PT to [facility] PT, OT, RN.)  Social Determinants of Health (SDOH) Interventions     Readmission Risk Interventions Readmission Risk Prevention Plan 08/07/2020  Transportation Screening Complete  HRI or Home Care Consult Complete   Palliative Care Screening Not Applicable  Medication Review (RN Care Manager) Complete  Some recent data might be hidden

## 2020-08-08 NOTE — Progress Notes (Signed)
Patient alert and oriented, vss, no complaints of pain.  Going with portable oxygen tank.  Son will pick up.  D/c telemetry and piv.  Escorted out of hospital via wheelchair by volunteers.

## 2020-08-18 ENCOUNTER — Ambulatory Visit: Payer: Medicare Other | Admitting: Family

## 2020-08-20 ENCOUNTER — Ambulatory Visit: Payer: Medicare Other | Admitting: Family

## 2020-08-25 ENCOUNTER — Ambulatory Visit: Payer: Medicare Other | Admitting: Family

## 2020-09-02 ENCOUNTER — Ambulatory Visit: Payer: Medicare Other | Admitting: Family

## 2020-09-08 ENCOUNTER — Other Ambulatory Visit: Payer: Self-pay

## 2020-09-08 ENCOUNTER — Emergency Department: Payer: Medicare Other

## 2020-09-08 ENCOUNTER — Inpatient Hospital Stay
Admission: EM | Admit: 2020-09-08 | Discharge: 2020-09-12 | DRG: 291 | Disposition: A | Discharge status: Home Health | Payer: Medicare Other | Attending: Hospitalist | Admitting: Hospitalist

## 2020-09-08 ENCOUNTER — Inpatient Hospital Stay: Payer: Medicare Other

## 2020-09-08 ENCOUNTER — Encounter: Payer: Self-pay | Admitting: Internal Medicine

## 2020-09-08 DIAGNOSIS — Z881 Allergy status to other antibiotic agents status: Secondary | ICD-10-CM

## 2020-09-08 DIAGNOSIS — J441 Chronic obstructive pulmonary disease with (acute) exacerbation: Secondary | ICD-10-CM | POA: Diagnosis present

## 2020-09-08 DIAGNOSIS — J439 Emphysema, unspecified: Secondary | ICD-10-CM | POA: Diagnosis not present

## 2020-09-08 DIAGNOSIS — Z8249 Family history of ischemic heart disease and other diseases of the circulatory system: Secondary | ICD-10-CM

## 2020-09-08 DIAGNOSIS — Z87891 Personal history of nicotine dependence: Secondary | ICD-10-CM

## 2020-09-08 DIAGNOSIS — I5033 Acute on chronic diastolic (congestive) heart failure: Secondary | ICD-10-CM | POA: Diagnosis present

## 2020-09-08 DIAGNOSIS — M109 Gout, unspecified: Secondary | ICD-10-CM | POA: Diagnosis present

## 2020-09-08 DIAGNOSIS — E785 Hyperlipidemia, unspecified: Secondary | ICD-10-CM | POA: Diagnosis present

## 2020-09-08 DIAGNOSIS — J9621 Acute and chronic respiratory failure with hypoxia: Secondary | ICD-10-CM | POA: Diagnosis present

## 2020-09-08 DIAGNOSIS — Z888 Allergy status to other drugs, medicaments and biological substances status: Secondary | ICD-10-CM

## 2020-09-08 DIAGNOSIS — E662 Morbid (severe) obesity with alveolar hypoventilation: Secondary | ICD-10-CM | POA: Diagnosis present

## 2020-09-08 DIAGNOSIS — Z79899 Other long term (current) drug therapy: Secondary | ICD-10-CM | POA: Diagnosis not present

## 2020-09-08 DIAGNOSIS — E119 Type 2 diabetes mellitus without complications: Secondary | ICD-10-CM

## 2020-09-08 DIAGNOSIS — Z87892 Personal history of anaphylaxis: Secondary | ICD-10-CM | POA: Diagnosis not present

## 2020-09-08 DIAGNOSIS — E11649 Type 2 diabetes mellitus with hypoglycemia without coma: Secondary | ICD-10-CM | POA: Diagnosis not present

## 2020-09-08 DIAGNOSIS — I4891 Unspecified atrial fibrillation: Secondary | ICD-10-CM | POA: Diagnosis present

## 2020-09-08 DIAGNOSIS — I509 Heart failure, unspecified: Secondary | ICD-10-CM

## 2020-09-08 DIAGNOSIS — Z7901 Long term (current) use of anticoagulants: Secondary | ICD-10-CM

## 2020-09-08 DIAGNOSIS — Z955 Presence of coronary angioplasty implant and graft: Secondary | ICD-10-CM | POA: Diagnosis not present

## 2020-09-08 DIAGNOSIS — I110 Hypertensive heart disease with heart failure: Secondary | ICD-10-CM | POA: Diagnosis not present

## 2020-09-08 DIAGNOSIS — T502X5A Adverse effect of carbonic-anhydrase inhibitors, benzothiadiazides and other diuretics, initial encounter: Secondary | ICD-10-CM | POA: Diagnosis not present

## 2020-09-08 DIAGNOSIS — I159 Secondary hypertension, unspecified: Secondary | ICD-10-CM | POA: Diagnosis not present

## 2020-09-08 DIAGNOSIS — M549 Dorsalgia, unspecified: Secondary | ICD-10-CM | POA: Diagnosis present

## 2020-09-08 DIAGNOSIS — Z20822 Contact with and (suspected) exposure to covid-19: Secondary | ICD-10-CM | POA: Diagnosis present

## 2020-09-08 DIAGNOSIS — K219 Gastro-esophageal reflux disease without esophagitis: Secondary | ICD-10-CM | POA: Diagnosis present

## 2020-09-08 DIAGNOSIS — J9601 Acute respiratory failure with hypoxia: Secondary | ICD-10-CM

## 2020-09-08 DIAGNOSIS — I10 Essential (primary) hypertension: Secondary | ICD-10-CM | POA: Diagnosis present

## 2020-09-08 DIAGNOSIS — I2510 Atherosclerotic heart disease of native coronary artery without angina pectoris: Secondary | ICD-10-CM | POA: Diagnosis present

## 2020-09-08 DIAGNOSIS — D509 Iron deficiency anemia, unspecified: Secondary | ICD-10-CM | POA: Diagnosis present

## 2020-09-08 DIAGNOSIS — Z974 Presence of external hearing-aid: Secondary | ICD-10-CM | POA: Diagnosis not present

## 2020-09-08 DIAGNOSIS — J449 Chronic obstructive pulmonary disease, unspecified: Secondary | ICD-10-CM | POA: Diagnosis present

## 2020-09-08 DIAGNOSIS — G8929 Other chronic pain: Secondary | ICD-10-CM | POA: Diagnosis present

## 2020-09-08 DIAGNOSIS — N179 Acute kidney failure, unspecified: Secondary | ICD-10-CM | POA: Diagnosis not present

## 2020-09-08 DIAGNOSIS — G4733 Obstructive sleep apnea (adult) (pediatric): Secondary | ICD-10-CM | POA: Diagnosis not present

## 2020-09-08 LAB — CBC WITH DIFFERENTIAL/PLATELET
Abs Immature Granulocytes: 0.05 10*3/uL (ref 0.00–0.07)
Basophils Absolute: 0.1 10*3/uL (ref 0.0–0.1)
Basophils Relative: 1 %
Eosinophils Absolute: 0.2 10*3/uL (ref 0.0–0.5)
Eosinophils Relative: 2 %
HCT: 27.5 % — ABNORMAL LOW (ref 39.0–52.0)
Hemoglobin: 7.8 g/dL — ABNORMAL LOW (ref 13.0–17.0)
Immature Granulocytes: 1 %
Lymphocytes Relative: 9 %
Lymphs Abs: 0.6 10*3/uL — ABNORMAL LOW (ref 0.7–4.0)
MCH: 27.1 pg (ref 26.0–34.0)
MCHC: 28.4 g/dL — ABNORMAL LOW (ref 30.0–36.0)
MCV: 95.5 fL (ref 80.0–100.0)
Monocytes Absolute: 0.8 10*3/uL (ref 0.1–1.0)
Monocytes Relative: 11 %
Neutro Abs: 5.6 10*3/uL (ref 1.7–7.7)
Neutrophils Relative %: 76 %
Platelets: 258 10*3/uL (ref 150–400)
RBC: 2.88 MIL/uL — ABNORMAL LOW (ref 4.22–5.81)
RDW: 24.3 % — ABNORMAL HIGH (ref 11.5–15.5)
Smear Review: NORMAL
WBC: 7.3 10*3/uL (ref 4.0–10.5)
nRBC: 0.3 % — ABNORMAL HIGH (ref 0.0–0.2)

## 2020-09-08 LAB — GLUCOSE, CAPILLARY: Glucose-Capillary: 150 mg/dL — ABNORMAL HIGH (ref 70–99)

## 2020-09-08 LAB — COMPREHENSIVE METABOLIC PANEL
ALT: 13 U/L (ref 0–44)
AST: 18 U/L (ref 15–41)
Albumin: 3.3 g/dL — ABNORMAL LOW (ref 3.5–5.0)
Alkaline Phosphatase: 102 U/L (ref 38–126)
Anion gap: 9 (ref 5–15)
BUN: 25 mg/dL — ABNORMAL HIGH (ref 8–23)
CO2: 28 mmol/L (ref 22–32)
Calcium: 8.7 mg/dL — ABNORMAL LOW (ref 8.9–10.3)
Chloride: 102 mmol/L (ref 98–111)
Creatinine, Ser: 1.07 mg/dL (ref 0.61–1.24)
GFR, Estimated: >60 mL/min (ref >60)
Glucose, Bld: 99 mg/dL (ref 70–99)
Potassium: 3.8 mmol/L (ref 3.5–5.1)
Sodium: 139 mmol/L (ref 135–145)
Total Bilirubin: 0.5 mg/dL (ref 0.3–1.2)
Total Protein: 7.4 g/dL (ref 6.5–8.1)

## 2020-09-08 LAB — CBG MONITORING, ED
Glucose-Capillary: 156 mg/dL — ABNORMAL HIGH (ref 70–99)
Glucose-Capillary: 166 mg/dL — ABNORMAL HIGH (ref 70–99)

## 2020-09-08 LAB — PROTIME-INR
INR: 1.7 — ABNORMAL HIGH (ref 0.8–1.2)
Prothrombin Time: 19.7 seconds — ABNORMAL HIGH (ref 11.4–15.2)

## 2020-09-08 LAB — RESP PANEL BY RT-PCR (FLU A&B, COVID) ARPGX2
Influenza A by PCR: NEGATIVE
Influenza B by PCR: NEGATIVE
SARS Coronavirus 2 by RT PCR: NEGATIVE

## 2020-09-08 LAB — TROPONIN I (HIGH SENSITIVITY): Troponin I (High Sensitivity): 5 ng/L (ref <18)

## 2020-09-08 LAB — BRAIN NATRIURETIC PEPTIDE: B Natriuretic Peptide: 267.3 pg/mL — ABNORMAL HIGH (ref 0.0–100.0)

## 2020-09-08 LAB — LACTIC ACID, PLASMA: Lactic Acid, Venous: 0.9 mmol/L (ref 0.5–1.9)

## 2020-09-08 LAB — PROCALCITONIN: Procalcitonin: <0.1 ng/mL

## 2020-09-08 MED ORDER — IOHEXOL 350 MG/ML SOLN
100.0000 mL | Freq: Once | INTRAVENOUS | Status: AC | PRN
  Administered 2020-09-08: 14:00:00 100 mL via INTRAVENOUS

## 2020-09-08 MED ORDER — TRAZODONE HCL 50 MG PO TABS: 50.0000 mg | ORAL_TABLET | Freq: Every evening | ORAL | Status: DC | PRN

## 2020-09-08 MED ORDER — FUROSEMIDE 10 MG/ML IJ SOLN
40.0000 mg | Freq: Once | INTRAMUSCULAR | Status: AC
  Administered 2020-09-08: 07:00:00 40 mg via INTRAVENOUS
  Filled 2020-09-08: qty 4

## 2020-09-08 MED ORDER — DM-GUAIFENESIN ER 30-600 MG PO TB12
1.0000 | ORAL_TABLET | Freq: Two times a day (BID) | ORAL | Status: DC | PRN
  Administered 2020-09-10: 23:00:00 1 via ORAL
  Filled 2020-09-08: qty 1

## 2020-09-08 MED ORDER — WARFARIN SODIUM 2.5 MG PO TABS: 3.7500 mg | ORAL_TABLET | ORAL | Status: DC

## 2020-09-08 MED ORDER — WARFARIN SODIUM 2.5 MG PO TABS
3.7500 mg | ORAL_TABLET | Freq: Once | ORAL | Status: AC
  Administered 2020-09-08: 17:00:00 3.75 mg via ORAL
  Filled 2020-09-08: qty 1

## 2020-09-08 MED ORDER — SODIUM CHLORIDE 0.9% FLUSH: 3.0000 mL | INTRAVENOUS | Status: DC | PRN

## 2020-09-08 MED ORDER — TRAMADOL HCL 50 MG PO TABS
50.0000 mg | ORAL_TABLET | Freq: Three times a day (TID) | ORAL | Status: DC | PRN
  Administered 2020-09-09 – 2020-09-12 (×7): 50 mg via ORAL
  Filled 2020-09-08 (×8): qty 1

## 2020-09-08 MED ORDER — ACETAMINOPHEN 500 MG PO TABS: 1000.0000 mg | ORAL_TABLET | Freq: Three times a day (TID) | ORAL | Status: DC | PRN

## 2020-09-08 MED ORDER — SOTALOL HCL 80 MG PO TABS
160.0000 mg | ORAL_TABLET | Freq: Two times a day (BID) | ORAL | Status: DC
Start: 2020-09-08 — End: 2020-09-10
  Administered 2020-09-08 – 2020-09-09 (×2): 160 mg via ORAL
  Filled 2020-09-08 (×6): qty 2

## 2020-09-08 MED ORDER — ENOXAPARIN SODIUM 40 MG/0.4ML ~~LOC~~ SOLN: 40.0000 mg | SUBCUTANEOUS | Status: DC

## 2020-09-08 MED ORDER — ALPRAZOLAM 0.5 MG PO TABS
0.2500 mg | ORAL_TABLET | Freq: Two times a day (BID) | ORAL | Status: DC | PRN
  Administered 2020-09-12: 0.25 mg via ORAL
  Filled 2020-09-08 (×2): qty 1

## 2020-09-08 MED ORDER — WARFARIN SODIUM 2.5 MG PO TABS
2.5000 mg | ORAL_TABLET | ORAL | Status: DC
  Filled 2020-09-08: qty 1

## 2020-09-08 MED ORDER — ATORVASTATIN CALCIUM 20 MG PO TABS
40.0000 mg | ORAL_TABLET | Freq: Every evening | ORAL | Status: DC
  Administered 2020-09-09 – 2020-09-11 (×3): 40 mg via ORAL
  Filled 2020-09-08 (×3): qty 2

## 2020-09-08 MED ORDER — PANTOPRAZOLE SODIUM 40 MG PO TBEC
40.0000 mg | DELAYED_RELEASE_TABLET | Freq: Two times a day (BID) | ORAL | Status: DC
  Administered 2020-09-08 – 2020-09-12 (×9): 40 mg via ORAL
  Filled 2020-09-08 (×9): qty 1

## 2020-09-08 MED ORDER — ENOXAPARIN SODIUM 150 MG/ML ~~LOC~~ SOLN
1.0000 mg/kg | Freq: Two times a day (BID) | SUBCUTANEOUS | Status: DC
  Administered 2020-09-08 – 2020-09-11 (×6): 135 mg via SUBCUTANEOUS
  Filled 2020-09-08 (×10): qty 0.9

## 2020-09-08 MED ORDER — ENOXAPARIN SODIUM 80 MG/0.8ML ~~LOC~~ SOLN: 0.5000 mg/kg | SUBCUTANEOUS | Status: DC

## 2020-09-08 MED ORDER — SODIUM CHLORIDE 0.9% FLUSH
3.0000 mL | Freq: Two times a day (BID) | INTRAVENOUS | Status: DC
  Administered 2020-09-08 – 2020-09-12 (×7): 3 mL via INTRAVENOUS

## 2020-09-08 MED ORDER — LISINOPRIL 5 MG PO TABS
2.5000 mg | ORAL_TABLET | Freq: Every day | ORAL | Status: DC
  Administered 2020-09-08 – 2020-09-10 (×3): 2.5 mg via ORAL
  Filled 2020-09-08 (×3): qty 1

## 2020-09-08 MED ORDER — ALLOPURINOL 100 MG PO TABS
100.0000 mg | ORAL_TABLET | Freq: Every day | ORAL | Status: DC
  Administered 2020-09-08 – 2020-09-12 (×5): 100 mg via ORAL
  Filled 2020-09-08 (×5): qty 1

## 2020-09-08 MED ORDER — METHYLPREDNISOLONE SODIUM SUCC 125 MG IJ SOLR
125.0000 mg | Freq: Once | INTRAMUSCULAR | Status: AC
  Administered 2020-09-08: 06:00:00 125 mg via INTRAVENOUS
  Filled 2020-09-08: qty 2

## 2020-09-08 MED ORDER — ALBUTEROL SULFATE HFA 108 (90 BASE) MCG/ACT IN AERS
2.0000 | INHALATION_SPRAY | Freq: Four times a day (QID) | RESPIRATORY_TRACT | Status: DC | PRN
  Filled 2020-09-08: qty 6.7

## 2020-09-08 MED ORDER — WARFARIN SODIUM 2.5 MG PO TABS: 2.5000 mg | ORAL_TABLET | ORAL | Status: DC

## 2020-09-08 MED ORDER — SENNA 8.6 MG PO TABS
1.0000 | ORAL_TABLET | Freq: Two times a day (BID) | ORAL | Status: DC
  Administered 2020-09-08 – 2020-09-12 (×7): 8.6 mg via ORAL
  Filled 2020-09-08 (×8): qty 1

## 2020-09-08 MED ORDER — FERROUS SULFATE 325 (65 FE) MG PO TABS
325.0000 mg | ORAL_TABLET | Freq: Two times a day (BID) | ORAL | Status: DC
  Administered 2020-09-08 – 2020-09-12 (×8): 325 mg via ORAL
  Filled 2020-09-08 (×9): qty 1

## 2020-09-08 MED ORDER — IPRATROPIUM-ALBUTEROL 0.5-2.5 (3) MG/3ML IN SOLN
3.0000 mL | Freq: Once | RESPIRATORY_TRACT | Status: AC
  Administered 2020-09-08: 07:00:00 3 mL via RESPIRATORY_TRACT
  Filled 2020-09-08: qty 3

## 2020-09-08 MED ORDER — NITROGLYCERIN 0.4 MG SL SUBL: 0.4000 mg | SUBLINGUAL_TABLET | SUBLINGUAL | Status: DC | PRN

## 2020-09-08 MED ORDER — SODIUM CHLORIDE 0.9 % IV SOLN
250.0000 mL | INTRAVENOUS | Status: DC | PRN
Start: 2020-09-08 — End: 2020-09-12

## 2020-09-08 MED ORDER — GABAPENTIN 300 MG PO CAPS
900.0000 mg | ORAL_CAPSULE | Freq: Every day | ORAL | Status: DC
  Administered 2020-09-08 – 2020-09-11 (×4): 900 mg via ORAL
  Filled 2020-09-08 (×4): qty 3

## 2020-09-08 MED ORDER — GLIPIZIDE ER 5 MG PO TB24
5.0000 mg | ORAL_TABLET | Freq: Every day | ORAL | Status: DC
  Administered 2020-09-08 – 2020-09-09 (×2): 5 mg via ORAL
  Filled 2020-09-08 (×2): qty 1

## 2020-09-08 MED ORDER — INSULIN ASPART 100 UNIT/ML ~~LOC~~ SOLN
0.0000 [IU] | Freq: Three times a day (TID) | SUBCUTANEOUS | Status: DC
  Administered 2020-09-08 (×2): 4 [IU] via SUBCUTANEOUS
  Administered 2020-09-09 – 2020-09-10 (×2): 3 [IU] via SUBCUTANEOUS
  Filled 2020-09-08 (×4): qty 1

## 2020-09-08 MED ORDER — ACETAMINOPHEN 325 MG PO TABS
650.0000 mg | ORAL_TABLET | Freq: Four times a day (QID) | ORAL | Status: DC | PRN
  Administered 2020-09-09 – 2020-09-10 (×4): 650 mg via ORAL
  Filled 2020-09-08 (×5): qty 2

## 2020-09-08 MED ORDER — WARFARIN - PHARMACIST DOSING INPATIENT
Freq: Every day | Status: DC
  Filled 2020-09-08: qty 1

## 2020-09-08 MED ORDER — COLCHICINE 0.6 MG PO TABS
0.6000 mg | ORAL_TABLET | Freq: Every day | ORAL | Status: DC | PRN
  Filled 2020-09-08: qty 1

## 2020-09-08 MED ORDER — ALLOPURINOL 100 MG PO TABS
300.0000 mg | ORAL_TABLET | Freq: Every day | ORAL | Status: DC
Start: 2020-09-08 — End: 2020-09-12
  Administered 2020-09-08 – 2020-09-12 (×5): 300 mg via ORAL
  Filled 2020-09-08: qty 1
  Filled 2020-09-08: qty 3
  Filled 2020-09-08: qty 1
  Filled 2020-09-08: qty 3
  Filled 2020-09-08: qty 1

## 2020-09-08 MED ORDER — PREDNISONE 20 MG PO TABS
40.0000 mg | ORAL_TABLET | Freq: Every day | ORAL | Status: AC
  Administered 2020-09-08 – 2020-09-12 (×5): 40 mg via ORAL
  Filled 2020-09-08 (×5): qty 2

## 2020-09-08 MED ORDER — FUROSEMIDE 10 MG/ML IJ SOLN
40.0000 mg | Freq: Four times a day (QID) | INTRAMUSCULAR | Status: AC
  Administered 2020-09-08 (×2): 40 mg via INTRAVENOUS
  Filled 2020-09-08 (×2): qty 4

## 2020-09-08 MED ORDER — ACETAMINOPHEN 650 MG RE SUPP: 650.0000 mg | Freq: Four times a day (QID) | RECTAL | Status: DC | PRN

## 2020-09-08 NOTE — Progress Notes (Signed)
PHARMACIST - PHYSICIAN COMMUNICATION  CONCERNING:  Enoxaparin (Lovenox) for DVT Prophylaxis    RECOMMENDATION: Patient was prescribed enoxaprin 40mg  q24 hours for VTE prophylaxis.   Filed Weights   09/08/20 0606  Weight: 136.1 kg (300 lb)    Body mass index is 48.42 kg/m.  Estimated Creatinine Clearance: 85.4 mL/min (by C-G formula based on SCr of 1.07 mg/dL).   Based on [facility] policy patient is candidate for enoxaparin 0.5mg /kg TBW SQ every 24 hours based on BMI being >30.   DESCRIPTION: Pharmacy has adjusted enoxaparin dose per [facility] policy.  Patient is now receiving enoxaparin 67.5 mg every 24 hours    [facility], PharmD, BCPS 09/08/2020 11:22 AM

## 2020-09-08 NOTE — ED Provider Notes (Signed)
[facility] Emergency Department Provider Note  ____________________________________________  Time seen: Approximately 6:33 AM  I have reviewed the triage vital signs and the nursing notes.   HISTORY  Chief Complaint Shortness of Breath   HPI Aaron Ball is a 69 y.o. male with a history of A. fib on Coumadin, diastolic CHF, COPD on chronic 2 L nasal cannula, CAD status post stents, diabetes, hypertension, hyperlipidemia, OSA, morbid obesity who presents for evaluation of shortness of breath.  Patient endorses progressively worsening shortness of breath over the last couple of days which became severe and constant this evening.  Reports subjective fever and cough.  Patient has been vaccinated for flu and Covid.  Denies any known exposures.  Denies any dietary indiscretions.  Denies any chest pain.  Patient was found to be satting 78% on room air per EMS.   Patient denies abdominal pain, back pain, vomiting or diarrhea.  Past Medical History:  Diagnosis Date  . A-fib (HCC) 04/2011   a. s/p ablation x 2 w/o evi of recurrence   . Chronic diastolic CHF (congestive heart failure) (HCC)    a. echo 2012: EF >55%, nl RVSP  . COPD (chronic obstructive pulmonary disease) (HCC)   . Coronary artery disease    a. s/p stent in RCA in 1997 w/ ISR 2003 & 05/2013 s/p DES to mid RCA 05/2013; b. cardiac cath 02/2014: nonobstructive CAD with patent stent in RCA with 40% ISR, no evi of pulm HTN, nl LVEDP, EF >65%   . Diabetes mellitus (HCC)   . GERD (gastroesophageal reflux disease)   . Gout   . Hyperlipidemia   . Hypertension   . Morbid obesity (HCC)   . Obesity hypoventilation syndrome (HCC)   . OSA (obstructive sleep apnea)   . Wears hearing aid    right ear    Patient Active Problem List   Diagnosis Date Noted  . Acute on chronic respiratory failure with hypoxemia (HCC) 08/04/2020  . Acute on chronic diastolic CHF (congestive heart failure) (HCC) 08/04/2020  .  Symptomatic anemia 08/04/2020  . Atrial fibrillation, chronic (HCC) 08/04/2020  . Iron deficiency anemia 08/04/2020  . Atrial flutter (HCC) 09/05/2019  . Pulmonary edema 09/05/2019  . Morbid obesity (HCC) 09/05/2019  . Atrial tachycardia (HCC) 09/03/2019  . OSA (obstructive sleep apnea) 09/03/2019  . COPD (chronic obstructive pulmonary disease) (HCC) 09/03/2019  . Chronic back pain 09/03/2019  . Gout 09/03/2019  . Diabetes mellitus without complication (HCC) 09/03/2019  . Allergic rhinitis 09/03/2019  . Chest pain, central 10/08/2015  . AKI (acute kidney injury) (HCC) 08/19/2015  . CAD (coronary artery disease) 06/28/2011  . Obesity 06/28/2011  . Hyperlipidemia 06/28/2011  . HTN (hypertension) 06/28/2011    Past Surgical History:  Procedure Laterality Date  . ATRIAL ABLATION SURGERY     A-Fib  . BARIATRIC SURGERY    . CARDIAC CATHETERIZATION     s/p stent to RCA.   [name]. CARDIAC CATHETERIZATION  2008   Dr. Juliann Paresallwood @ [facility] .  [name]. CARDIOVERSION N/A 09/09/2019   Procedure: CARDIOVERSION;  Surgeon: Yvonne KendallEnd, Christopher, MD;  Location: [facility] ORS;  Service: Cardiovascular;  Laterality: N/A;  . CHOLECYSTECTOMY  2012  . heart stint  2013  . HIP FRACTURE SURGERY    . STOMACH SURGERY     gastric sleeve  . TEE WITHOUT CARDIOVERSION N/A 09/09/2019   Procedure: TRANSESOPHAGEAL ECHOCARDIOGRAM (TEE);  Surgeon: Yvonne KendallEnd, Christopher, MD;  Location: [facility] ORS;  Service: Cardiovascular;  Laterality: N/A;  Prior to Admission medications   Medication Sig Start Date End Date Taking? Authorizing Provider  acetaminophen (TYLENOL) 500 MG tablet Take 1,000 mg by mouth every 4 (four) hours as needed for pain.    [provider]  albuterol (VENTOLIN HFA) 108 (90 Base) MCG/ACT inhaler Inhale 2 puffs into the lungs every 6 (six) hours as needed for wheezing or shortness of breath. 08/08/20   Rhetta Mura, MD  allopurinol (ZYLOPRIM) 100 MG tablet Take 100 mg by mouth daily. (take with  tablet  to equal  total dose) 06/29/20   [provider]  allopurinol (ZYLOPRIM) 300 MG tablet Take 300 mg by mouth daily. (take with  tablet to equal  total dose)    [provider]  ALPRAZolam (XANAX) 0.25 MG tablet Take 0.25 mg by mouth 2 (two) times daily as needed for anxiety or sleep. 07/16/20   [provider]  atorvastatin (LIPITOR) 40 MG tablet Take 40 mg by mouth every evening.     [provider]  colchicine 0.6 MG tablet Take 0.6-1.2 mg by mouth daily as needed (gout flare).     [provider]  dextromethorphan-guaiFENesin (MUCINEX DM) 30-600 MG 12hr tablet Take 1 tablet by mouth 2 (two) times daily as needed for cough. 08/08/20   Rhetta Mura, MD  ferrous sulfate 325 (65 FE) MG tablet Take 1 tablet (325 mg total) by mouth 2 (two) times daily with a meal. 08/08/20   Rhetta Mura, MD  furosemide (LASIX) 20 MG tablet Take 1 tablet (20 mg total) by mouth 2 (two) times daily. 09/12/19   Standley Brooking, MD  furosemide (LASIX) 20 MG tablet Take 1 tablet (20 mg total) by mouth 2 (two) times daily. 08/08/20 08/08/21  Rhetta Mura, MD  gabapentin (NEURONTIN) 300 MG capsule Take 900 mg by mouth at bedtime.     [provider]  glipiZIDE (GLUCOTROL XL) 5 MG 24 hr tablet Take 5 mg by mouth daily. 07/03/20   [provider]  lisinopril (ZESTRIL) 2.5 MG tablet Take 1 tablet (2.5 mg total) by mouth daily. 08/09/20   Rhetta Mura, MD  nitroGLYCERIN (NITROSTAT) 0.4 MG SL tablet Place 0.4 mg under the tongue every 5 (five) minutes as needed for chest pain.     [provider]  pantoprazole (PROTONIX) 40 MG tablet Take 1 tablet (40 mg total) by mouth 2 (two) times daily. 08/08/20   Rhetta Mura, MD  sodium chloride (OCEAN) 0.65 % SOLN nasal spray Place 1 spray into both nostrils as needed for congestion. 08/08/20   Rhetta Mura, MD  sotalol (BETAPACE) 80 MG tablet Take 160 mg by  mouth 2 (two) times daily. 07/28/20   [provider]  traMADol (ULTRAM) 50 MG tablet Take 50 mg by mouth every 8 (eight) hours as needed for moderate pain.    [provider]  traZODone (DESYREL) 50 MG tablet Take 50 mg by mouth at bedtime as needed for sleep.     [provider]  warfarin (COUMADIN) 2.5 MG tablet Take 2.5 mg by mouth See admin instructions. Take 1 tablet (2.5mg ) by mouth every Tuesday, Wednesday, Thursday, Saturday and Sunday and take 1 tablets (3.75mg ) by mouth every Monday and Friday    [provider]    Allergies Erythromycin and Cyclobenzaprine  Family History  Problem Relation Age of Onset  . Heart disease Father   . Heart attack Father     Social History Social History   Tobacco Use  . Smoking  status: Former Smoker    Packs/day: 1.00    Years: 30.00    Pack years: 30.00    Types: Cigarettes    Quit date: 06/28/2003    Years since quitting: 17.2  . Smokeless tobacco: Never Used  Substance Use Topics  . Alcohol use: No  . Drug use: No    Review of Systems  Constitutional: + subjective fever. Eyes: Negative for visual changes. ENT: Negative for sore throat. Neck: No neck pain  Cardiovascular: Negative for chest pain. Respiratory: + shortness of breath, cough Gastrointestinal: Negative for abdominal pain, vomiting or diarrhea. Genitourinary: Negative for dysuria. Musculoskeletal: Negative for back pain. Skin: Negative for rash. Neurological: Negative for headaches, weakness or numbness. Psych: No SI or HI  ____________________________________________   PHYSICAL EXAM:  VITAL SIGNS: ED Triage Vitals  Enc Vitals Group     BP --      Pulse --      Resp --      Temp 09/08/20 0610 98 F (36.7 C)     Temp Source 09/08/20 0610 Oral     SpO2 --      Weight 09/08/20 0606 300 lb (136.1 kg)     Height 09/08/20 0606 5\' 6"  (1.676 m)     Head Circumference --      Peak Flow --      Pain Score 09/08/20 0605 0      Pain Loc --      Pain Edu? --      Excl. in GC? --     Constitutional: Alert and oriented, moderate to severe respiratory distress HEENT:      Head: Normocephalic and atraumatic.         Eyes: Conjunctivae are normal. Sclera is non-icteric.       Mouth/Throat: Mucous membranes are moist.       Neck: Supple with no signs of meningismus. Cardiovascular: Regular rate and rhythm. No murmurs, gallops, or rubs. 2+ symmetrical distal pulses are present in all extremities. Elevated JVD. Respiratory: Moderate to severe respiratory distress, tripoding, increased work of breathing, severely diminished air movement bilaterally with faint wheezes and crackles on the basis.  Hypoxic to 88% on 2 L.   Gastrointestinal: Soft, non tender, and non distended Musculoskeletal: 3+ pitting edema bilaterally Neurologic: Normal speech and language. Face is symmetric. Moving all extremities. No gross focal neurologic deficits are appreciated. Skin: Skin is warm, dry and intact. No rash noted. Psychiatric: Mood and affect are normal. Speech and behavior are normal.  ____________________________________________   LABS (all labs ordered are listed, but only abnormal results are displayed)  Labs Reviewed  BLOOD GAS, VENOUS - Abnormal; Notable for the following components:      Result Value   pO2, Ven <31.0 (*)    Bicarbonate 29.3 (*)    Acid-Base Excess 3.1 (*)    All other components within normal limits  PROTIME-INR - Abnormal; Notable for the following components:   Prothrombin Time 19.7 (*)    INR 1.7 (*)    All other components within normal limits  CBC WITH DIFFERENTIAL/PLATELET - Abnormal; Notable for the following components:   RBC 2.88 (*)    Hemoglobin 7.8 (*)    HCT 27.5 (*)    MCHC 28.4 (*)    RDW 24.3 (*)    nRBC 0.3 (*)    Lymphs Abs 0.6 (*)    All other components within normal limits  COMPREHENSIVE METABOLIC PANEL - Abnormal; Notable for the following components:  BUN 25 (*)     Calcium 8.7 (*)    Albumin 3.3 (*)    All other components within normal limits  RESP PANEL BY RT-PCR (FLU A&B, COVID) ARPGX2  CULTURE, BLOOD (ROUTINE X 2)  CULTURE, BLOOD (ROUTINE X 2)  LACTIC ACID, PLASMA  LACTIC ACID, PLASMA  PROCALCITONIN  BRAIN NATRIURETIC PEPTIDE  TROPONIN I (HIGH SENSITIVITY)   ____________________________________________  EKG  ED ECG REPORT I, Nita Sickle, the attending physician, personally viewed and interpreted this ECG.  Sinus rhythm, rate of 77, normal intervals, no ST elevations or depressions.  Unchanged from prior from November 2021. ____________________________________________  RADIOLOGY  I have personally reviewed the images performed during this visit and I agree with the Radiologist's read.   Interpretation by Radiologist:  DG Chest Portable 1 View  Result Date: 09/08/2020 CLINICAL DATA:  Shortness of breath. EXAM: PORTABLE CHEST 1 VIEW COMPARISON:  08/04/2020. FINDINGS: Cardiomegaly. Diffuse bilateral pulmonary interstitial prominence noted, increased from prior study of 08/04/2020. Findings most consistent CHF. Pneumonitis cannot be excluded. Low lung volumes with bibasilar atelectasis. Small bilateral pleural effusions cannot be excluded. No pneumothorax. Scoliosis and degenerative change thoracic spine. IMPRESSION: Cardiomegaly. Diffuse bilateral pulmonary interstitial prominence noted, increased from prior study of 08/04/2020. Findings most consistent CHF. Pneumonitis cannot be excluded. Small bilateral pleural effusions cannot be excluded. Electronically Signed   By: Maisie Fus  Register   On: 09/08/2020 06:43      ____________________________________________   PROCEDURES  Procedure(s) performed:yes .1-3 Lead EKG Interpretation Performed by: Nita Sickle, MD Authorized by: Nita Sickle, MD     Interpretation: non-specific     ECG rate assessment: normal     Rhythm: sinus rhythm     Ectopy: none     Critical  Care performed: yes  CRITICAL CARE Performed by: Nita Sickle  ?  Total critical care time: 45 min  Critical care time was exclusive of separately billable procedures and treating other patients.  Critical care was necessary to treat or prevent imminent or life-threatening deterioration.  Critical care was time spent personally by me on the following activities: development of treatment plan with patient and/or surrogate as well as nursing, discussions with consultants, evaluation of patient's response to treatment, examination of patient, obtaining history from patient or surrogate, ordering and performing treatments and interventions, ordering and review of laboratory studies, ordering and review of radiographic studies, pulse oximetry and re-evaluation of patient's condition.  ____________________________________________   INITIAL IMPRESSION / ASSESSMENT AND PLAN / ED COURSE   69 y.o. male with a history of A. fib on Coumadin, diastolic CHF, COPD on chronic 2 L nasal cannula, CAD status post stents, diabetes, hypertension, hyperlipidemia, OSA, morbid obesity who presents for evaluation of shortness of breath.  Patient looks grossly volume overloaded with crackles on bilateral bases, 3+ pitting edema and elevated JVD.  He also has diminished air movement bilaterally.  Patient tripoding with increased work of breathing, hypoxic on 2 L nasal cannula.  Patient was placed on BiPAP and started on duo nebs, given IV Solu-Medrol and IV Lasix.  Has been endorsing subjective fevers at home although temperature here is 98.  Ddx acute respiratory failure from COPD versus CHF versus pneumonia versus Covid versus flu versus pulmonary edema.  Less likely PE on Coumadin.  Will check an INR to make sure patient is therapeutic.  EKG with no signs of acute ischemic changes.  Patient placed on telemetry for close monitoring.  Review of old medical records shows most recent echocardiogram as of  a month ago  with a normal EF.  Patient was also admitted to the hospital discharge a month ago.  The note for that admission was reviewed showing the patient was admitted for acute hypoxic respiratory failure secondary to CHF exacerbation.  Labs and chest x-ray pending.  Plan to admit to the hospitalist service.     _________________________ 7:13 AM on 09/08/2020 ----------------------------------------- Chest x-ray visualized by me consistent with CHF exacerbation, confirmed by radiology.  Troponin negative.  Covid and flu negative.  Stable anemia.  No leukocytosis and no fever at this time.  We will hold off on antibiotics.  Procalcitonin is pending.  VBG with no signs of CO2 retention.  Lactic is negative.  Patient received 4 mg of IV Lasix.  Looks markedly improved on BiPAP.  Will discuss to hospitalist for admission   _____________________________________________ Please note:  Patient was evaluated in Emergency Department today for the symptoms described in the history of present illness. Patient was evaluated in the context of the global COVID-19 pandemic, which necessitated consideration that the patient might be at risk for infection with the SARS-CoV-2 virus that causes COVID-19. Institutional protocols and algorithms that pertain to the evaluation of patients at risk for COVID-19 are in a state of rapid change based on information released by regulatory bodies including the CDC and federal and state organizations. These policies and algorithms were followed during the patient's care in the ED.  Some ED evaluations and interventions may be delayed as a result of limited staffing during the pandemic.   [place] Controlled Substance Database was reviewed by me. ____________________________________________   FINAL CLINICAL IMPRESSION(S) / ED DIAGNOSES   Final diagnoses:  Acute respiratory failure with hypoxia (HCC)  Acute on chronic congestive heart failure, unspecified heart failure type (HCC)       NEW MEDICATIONS STARTED DURING THIS VISIT:  ED Discharge Orders    None       Note:  This document was prepared using Dragon voice recognition software and may include unintentional dictation errors.    Don Perking, [place], MD 09/08/20 [phone]

## 2020-09-08 NOTE — Progress Notes (Signed)
ANTICOAGULATION CONSULT NOTE  Pharmacy Consult for warfarin Indication: atrial fibrillation  Allergies  Allergen Reactions  . Erythromycin Hives and Anaphylaxis  . Cyclobenzaprine Palpitations    Triggered a fib    Patient Measurements: Height: 5\' 6"  (167.6 cm) Weight: 136.1 kg (300 lb) IBW/kg (Calculated) : 63.8  Vital Signs: Temp: 98 F (36.7 C) (12/28 0610) Temp Source: Oral (12/28 0610) BP: 153/57 (12/28 1505) Pulse Rate: 80 (12/28 1430)  Labs: Recent Labs    09/08/20 0612  HGB 7.8*  HCT 27.5*  PLT 258  LABPROT 19.7*  INR 1.7*  CREATININE 1.07  TROPONINIHS 5    Estimated Creatinine Clearance: 85.4 mL/min (by C-G formula based on SCr of 1.07 mg/dL).   Medical History: Past Medical History:  Diagnosis Date  . A-fib (HCC) 04/2011   a. s/p ablation x 2 w/o evi of recurrence   . Chronic diastolic CHF (congestive heart failure) (HCC)    a. echo 2012: EF >55%, nl RVSP  . COPD (chronic obstructive pulmonary disease) (HCC)   . Coronary artery disease    a. s/p stent in RCA in 1997 w/ ISR 2003 & 05/2013 s/p DES to mid RCA 05/2013; b. cardiac cath 02/2014: nonobstructive CAD with patent stent in RCA with 40% ISR, no evi of pulm HTN, nl LVEDP, EF >65%   . Diabetes mellitus (HCC)   . GERD (gastroesophageal reflux disease)   . Gout   . Hyperlipidemia   . Hypertension   . Morbid obesity (HCC)   . Obesity hypoventilation syndrome (HCC)   . OSA (obstructive sleep apnea)   . Wears hearing aid    right ear    Assessment: 69 year old male presented with SOB. Patient with h/o afib on warfarin PTA. Home dose 2.5 mg all days except Monday and Friday when he takes 3.75 mg. Patient presents with subtherapeutic INR.  Date INR Dose 12/28 1.7  Goal of Therapy:  INR 2-3 Monitor platelets by anticoagulation protocol: Yes   Plan:  Per med rec, patient reported last dose of warfarin 12/21; I would expect INR to be lower if he has not taken in one week. Will give warfarin 3.75  mg today (1.5x home dose). Will plan to resume home dose tomorrow pending INR.  1/22, PharmD 09/08/2020,3:15 PM

## 2020-09-08 NOTE — H&P (Signed)
History and Physical    Aaron Ball FAO:[id number] DOB: 1951-05-05 DOA: 09/08/2020  PCP: [facility] (Confirm with patient/family/NH records and if not entered, this has to be entered at [facility] point of entry) Patient coming from: home  I have personally briefly reviewed patient's old medical records in [facility]  Chief Complaint: progressive SOB  HPI: Aaron Ball is a 69 y.o. male with medical history significant of A. fib on Coumadin, diastolic CHF, COPD on chronic 2 L nasal cannula, CAD status post stents, diabetes, hypertension, hyperlipidemia, OSA, morbid obesity who presents for evaluation of shortness of breath.  He was admitted 11/23-11/27/21 for respiratory failure 2/2 HFpEF. Patient endorses progressively worsening shortness of breath over the last 48 hours  which became severe and constant this evening.  Reports subjective fever and cough.  Patient has been vaccinated for flu and Covid.  Denies any known exposures.  Denies any dietary indiscretions.  Denies any chest pain.  Patient was found to be satting 78% on room air per EMS.  Patient denies abdominal pain, back pain, vomiting or diarrhea Due to his respiratory distress he presents to [facility].  ED Course: T 98, 157/80  HR 73,  RR 20 O2 sat 78% 2 L O2. VBG 7.35/53/<31  Cmet nl, BNP 267.3  Troponin 5 Lactic acid 0.9, CBC/diff nl, INR 1.7. ED exam notable for increased WOB, diffuse crackles, decreased volume of airflow, 3+ pitting edema and JVD. He was given nebulizer tx, IV lasix and put to BiPAP. TRH called to admit for further evaluation and management.   Review of Systems: As per HPI otherwise 10 point review of systems negative.    Past Medical History:  Diagnosis Date  . A-fib (HCC) 04/2011   a. s/p ablation x 2 w/o evi of recurrence   . Chronic diastolic CHF (congestive heart failure) (HCC)    a. echo 2012: EF >55%, nl RVSP  . COPD (chronic obstructive pulmonary disease) (HCC)   . Coronary  artery disease    a. s/p stent in RCA in 1997 w/ ISR 2003 & 05/2013 s/p DES to mid RCA 05/2013; b. cardiac cath 02/2014: nonobstructive CAD with patent stent in RCA with 40% ISR, no evi of pulm HTN, nl LVEDP, EF >65%   . Diabetes mellitus (HCC)   . GERD (gastroesophageal reflux disease)   . Gout   . Hyperlipidemia   . Hypertension   . Morbid obesity (HCC)   . Obesity hypoventilation syndrome (HCC)   . OSA (obstructive sleep apnea)   . Wears hearing aid    right ear    Past Surgical History:  Procedure Laterality Date  . ATRIAL ABLATION SURGERY     A-Fib  . BARIATRIC SURGERY    . CARDIAC CATHETERIZATION     s/p stent to RCA.   [name] CARDIAC CATHETERIZATION  2008   Dr. Juliann Pares @ [facility] .  [name] CARDIOVERSION N/A 09/09/2019   Procedure: CARDIOVERSION;  Surgeon: Yvonne Kendall, MD;  Location: [facility] ORS;  Service: Cardiovascular;  Laterality: N/A;  . CHOLECYSTECTOMY  2012  . heart stint  2013  . HIP FRACTURE SURGERY    . STOMACH SURGERY     gastric sleeve  . TEE WITHOUT CARDIOVERSION N/A 09/09/2019   Procedure: TRANSESOPHAGEAL ECHOCARDIOGRAM (TEE);  Surgeon: Yvonne Kendall, MD;  Location: [facility] ORS;  Service: Cardiovascular;  Laterality: N/A;    Soc Hx - married but ended in Divorce. He has 3 children and several grandchildren and great grandchildren. Not working.  Lives alone.    reports that he quit smoking about 17 years ago. His smoking use included cigarettes. He has a 30.00 pack-year smoking history. He has never used smokeless tobacco. He reports that he does not drink alcohol and does not use drugs.  Allergies  Allergen Reactions  . Erythromycin Hives and Anaphylaxis  . Cyclobenzaprine Palpitations    Triggered a fib    Family History  Problem Relation Age of Onset  . Heart disease Father   . Heart attack Father      Prior to Admission medications   Medication Sig Start Date End Date Taking? Authorizing Provider  acetaminophen (TYLENOL) 500 MG tablet Take 1,000 mg by  mouth every 4 (four) hours as needed for pain.    [provider]  albuterol (VENTOLIN HFA) 108 (90 Base) MCG/ACT inhaler Inhale 2 puffs into the lungs every 6 (six) hours as needed for wheezing or shortness of breath. 08/08/20   Rhetta MuraSamtani, Jai-Gurmukh, MD  allopurinol (ZYLOPRIM) 100 MG tablet Take 100 mg by mouth daily. (take with 300mg  tablet to equal 400mg  total dose) 06/29/20   [provider]  allopurinol (ZYLOPRIM) 300 MG tablet Take 300 mg by mouth daily. (take with 100mg  tablet to equal 400mg  total dose)    [provider]  ALPRAZolam (XANAX) 0.25 MG tablet Take 0.25 mg by mouth 2 (two) times daily as needed for anxiety or sleep. 07/16/20   [provider]  atorvastatin (LIPITOR) 40 MG tablet Take 40 mg by mouth every evening.     [provider]  colchicine 0.6 MG tablet Take 0.6-1.2 mg by mouth daily as needed (gout flare).     [provider]  dextromethorphan-guaiFENesin (MUCINEX DM) 30-600 MG 12hr tablet Take 1 tablet by mouth 2 (two) times daily as needed for cough. 08/08/20   Rhetta MuraSamtani, Jai-Gurmukh, MD  ferrous sulfate 325 (65 FE) MG tablet Take 1 tablet (325 mg total) by mouth 2 (two) times daily with a meal. 08/08/20   Rhetta MuraSamtani, Jai-Gurmukh, MD  furosemide (LASIX) 20 MG tablet Take 1 tablet (20 mg total) by mouth 2 (two) times daily. 09/12/19   Standley BrookingGoodrich, Daniel P, MD  furosemide (LASIX) 20 MG tablet Take 1 tablet (20 mg total) by mouth 2 (two) times daily. 08/08/20 08/08/21  Rhetta MuraSamtani, Jai-Gurmukh, MD  gabapentin (NEURONTIN) 300 MG capsule Take 900 mg by mouth at bedtime.     [provider]  glipiZIDE (GLUCOTROL XL) 5 MG 24 hr tablet Take 5 mg by mouth daily. 07/03/20   [provider]  lisinopril (ZESTRIL) 2.5 MG tablet Take 1 tablet (2.5 mg total) by mouth daily. 08/09/20   Rhetta MuraSamtani, Jai-Gurmukh, MD  nitroGLYCERIN (NITROSTAT) 0.4 MG SL tablet Place 0.4 mg under the tongue every 5 (five) minutes as needed for chest pain.      [provider]  pantoprazole (PROTONIX) 40 MG tablet Take 1 tablet (40 mg total) by mouth 2 (two) times daily. 08/08/20   Rhetta MuraSamtani, Jai-Gurmukh, MD  sodium chloride (OCEAN) 0.65 % SOLN nasal spray Place 1 spray into both nostrils as needed for congestion. 08/08/20   Rhetta MuraSamtani, Jai-Gurmukh, MD  sotalol (BETAPACE) 80 MG tablet Take 160 mg by mouth 2 (two) times daily. 07/28/20   [provider]  traMADol (ULTRAM) 50 MG tablet Take 50 mg by mouth every 8 (eight) hours as needed for moderate pain.    [provider]  traZODone (DESYREL) 50 MG tablet Take 50 mg by mouth at bedtime as needed for sleep.  [provider]  warfarin (COUMADIN) 2.5 MG tablet Take 2.5 mg by mouth See admin instructions. Take 1 tablet (2.5mg ) by mouth every Tuesday, Wednesday, Thursday, Saturday and Sunday and take 1 tablets (3.75mg ) by mouth every Monday and Friday    [provider]    Physical Exam: Vitals:   09/08/20 0930 09/08/20 0945 09/08/20 1000 09/08/20 1015  BP: (!) 157/80     Pulse: 73 69 65 90  Resp: 20 (!) 21 (!) 21 (!) 24  Temp:      TempSrc:      SpO2: 92% 97% (!) 60% (!) 89%  Weight:      Height:         Vitals:   09/08/20 0930 09/08/20 0945 09/08/20 1000 09/08/20 1015  BP: (!) 157/80     Pulse: 73 69 65 90  Resp: 20 (!) 21 (!) 21 (!) 24  Temp:      TempSrc:      SpO2: 92% 97% (!) 60% (!) 89%  Weight:      Height:       General:  Obese man sitting in a chair with mild increased WOB Eyes: PERRL, lids and conjunctivae normal ENMT: Mucous membranes are moist. Posterior pharynx clear of any exudate or lesions.Normal dentition.  Neck: normal, supple, no masses, no thyromegaly Respiratory: decreased BS, clear to auscultation bilaterally, no wheezing, no crackles. Increased respiratory effort. No accessory muscle use.  Cardiovascular: Regular rate and rhythm with PVCs, no murmurs / rubs / gallops. 3+ distal Lower extremity edema. trace pedal pulses.  No carotid bruits.  Abdomen: obese, no tenderness, Exam hindered by girth. Bowel sounds positive.  Musculoskeletal: no clubbing / cyanosis. No joint deformity upper and lower extremities. Good ROM, no contractures. Normal muscle tone. No tophi Skin: no rashes, lesions, ulcers. No induration Neurologic: CN 2-12 grossly intact.  Strength 5/5 in all 4.  Psychiatric: Normal judgment and insight. Alert and oriented x 3. Normal mood.    Labs on Admission: I have personally reviewed following labs and imaging studies  CBC: Recent Labs  Lab 09/08/20 0612  WBC 7.3  NEUTROABS 5.6  HGB 7.8*  HCT 27.5*  MCV 95.5  PLT 258   Basic Metabolic Panel: Recent Labs  Lab 09/08/20 0612  NA 139  K 3.8  CL 102  CO2 28  GLUCOSE 99  BUN 25*  CREATININE 1.07  CALCIUM 8.7*   GFR: Estimated Creatinine Clearance: 85.4 mL/min (by C-G formula based on SCr of 1.07 mg/dL). Liver Function Tests: Recent Labs  Lab 09/08/20 0612  AST 18  ALT 13  ALKPHOS 102  BILITOT 0.5  PROT 7.4  ALBUMIN 3.3*   No results for input(s): LIPASE, AMYLASE in the last 168 hours. No results for input(s): AMMONIA in the last 168 hours. Coagulation Profile: Recent Labs  Lab 09/08/20 0612  INR 1.7*   Cardiac Enzymes: No results for input(s): CKTOTAL, CKMB, CKMBINDEX, TROPONINI in the last 168 hours. BNP (last 3 results) No results for input(s): PROBNP in the last 8760 hours. HbA1C: No results for input(s): HGBA1C in the last 72 hours. CBG: No results for input(s): GLUCAP in the last 168 hours. Lipid Profile: No results for input(s): CHOL, HDL, LDLCALC, TRIG, CHOLHDL, LDLDIRECT in the last 72 hours. Thyroid Function Tests: No results for input(s): TSH, T4TOTAL, FREET4, T3FREE, THYROIDAB in the last 72 hours. Anemia Panel: No results for input(s): VITAMINB12, FOLATE, FERRITIN, TIBC, IRON, RETICCTPCT in the last 72 hours. Urine analysis:  Component Value Date/Time   COLORURINE STRAW (A) 08/20/2015 1410    APPEARANCEUR CLEAR (A) 08/20/2015 1410   APPEARANCEUR Cloudy 01/31/2012 1220   LABSPEC 1.012 08/20/2015 1410   LABSPEC 1.019 01/31/2012 1220   PHURINE 7.0 08/20/2015 1410   GLUCOSEU NEGATIVE 08/20/2015 1410   GLUCOSEU Negative 01/31/2012 1220   HGBUR NEGATIVE 08/20/2015 1410   BILIRUBINUR NEGATIVE 08/20/2015 1410   BILIRUBINUR Negative 01/31/2012 1220   KETONESUR NEGATIVE 08/20/2015 1410   PROTEINUR NEGATIVE 08/20/2015 1410   NITRITE NEGATIVE 08/20/2015 1410   LEUKOCYTESUR NEGATIVE 08/20/2015 1410   LEUKOCYTESUR 3+ 01/31/2012 1220    Radiological Exams on Admission: DG Chest Portable 1 View  Result Date: 09/08/2020 CLINICAL DATA:  Shortness of breath. EXAM: PORTABLE CHEST 1 VIEW COMPARISON:  08/04/2020. FINDINGS: Cardiomegaly. Diffuse bilateral pulmonary interstitial prominence noted, increased from prior study of 08/04/2020. Findings most consistent CHF. Pneumonitis cannot be excluded. Low lung volumes with bibasilar atelectasis. Small bilateral pleural effusions cannot be excluded. No pneumothorax. Scoliosis and degenerative change thoracic spine. IMPRESSION: Cardiomegaly. Diffuse bilateral pulmonary interstitial prominence noted, increased from prior study of 08/04/2020. Findings most consistent CHF. Pneumonitis cannot be excluded. Small bilateral pleural effusions cannot be excluded. Electronically Signed   By: Maisie Fus  Register   On: 09/08/2020 06:43    EKG: Independently reviewed. NSR, non-specific conduction delay  Assessment/Plan Active Problems:   Acute on chronic diastolic CHF (congestive heart failure) (HCC)   COPD (chronic obstructive pulmonary disease) (HCC)   Gout   Hyperlipidemia   HTN (hypertension)   OSA (obstructive sleep apnea)   Diabetes mellitus without complication (HCC)   Iron deficiency anemia    1. Acute on Chronic respiratory failure 2/2 HFpEF and COPD. Patient at my exam was on 5 liters O2 with sats ranging from 65% to 90%.  Plan Progressive care  admit   IV lasix 40 mg q 6 x 3 doses then resume oral regimen  Oxygen support to keep sat>85%  Continue home cardiac meds  2. COPD - was wheezing at admission to ED but clear now. Did receive solumedrol IV  Plan  Continue home regimen  Prednisone 40 mg daily  Oxygen support  3. DM - will continue home oral meds  Plan A1C if no recent lab  Sliding scale coverage 2/2 steroids  4. OSA - will need to continue CPAP when respirtory stable.  5. HTN- continue home meds  6. Gout - continue home regimen.   7. Iron deficiency anemia - Hgb at 7.9 is his current baseline. No indication for transfusion, especially in light of heart failure.  Plan Continue iron replacement.  DVT prophylaxis: lovenox  Code Status: full code  Family Communication: spoke with son Mat. Explained Dx and Tx plan. Advised they help with dietary compliance.   Disposition Plan: home when stable  Consults called: none (with names) Admission status: inpatient   Illene Regulus MD [facility] Pager [phone]  If 7PM-7AM, please contact night-coverage [email or web address] Password [facility]  09/08/2020, 10:53 AM

## 2020-09-08 NOTE — ED Notes (Signed)
Patient up to side of bed with assistance of EMS student and this RN. Patient urinated approximately 500cc. Placed patient back on nasal cannula 6L. Sats are 96-98%.

## 2020-09-08 NOTE — ED Notes (Signed)
Patient up at side of bed to urinate. Approximately 550cc. Patient sitting in chair in room. Callbell within reach.

## 2020-09-08 NOTE — ED Notes (Signed)
Patient up to toilet. Formed BM. Patient sitting in chair at bedside. Callbell in hand.

## 2020-09-08 NOTE — Progress Notes (Signed)
ANTICOAGULATION CONSULT NOTE  Pharmacy Consult for warfarin Indication: atrial fibrillation  Allergies  Allergen Reactions  . Erythromycin Hives and Anaphylaxis  . Cyclobenzaprine Palpitations    Triggered a fib    Patient Measurements: Height: 5\' 6"  (167.6 cm) Weight: 136.1 kg (300 lb) IBW/kg (Calculated) : 63.8  Vital Signs: BP: 134/63 (12/28 1730) Pulse Rate: 65 (12/28 1730)  Labs: Recent Labs    09/08/20 0612  HGB 7.8*  HCT 27.5*  PLT 258  LABPROT 19.7*  INR 1.7*  CREATININE 1.07  TROPONINIHS 5    Estimated Creatinine Clearance: 85.4 mL/min (by C-G formula based on SCr of 1.07 mg/dL).   Medical History: Past Medical History:  Diagnosis Date  . A-fib (HCC) 04/2011   a. s/p ablation x 2 w/o evi of recurrence   . Chronic diastolic CHF (congestive heart failure) (HCC)    a. echo 2012: EF >55%, nl RVSP  . COPD (chronic obstructive pulmonary disease) (HCC)   . Coronary artery disease    a. s/p stent in RCA in 1997 w/ ISR 2003 & 05/2013 s/p DES to mid RCA 05/2013; b. cardiac cath 02/2014: nonobstructive CAD with patent stent in RCA with 40% ISR, no evi of pulm HTN, nl LVEDP, EF >65%   . Diabetes mellitus (HCC)   . GERD (gastroesophageal reflux disease)   . Gout   . Hyperlipidemia   . Hypertension   . Morbid obesity (HCC)   . Obesity hypoventilation syndrome (HCC)   . OSA (obstructive sleep apnea)   . Wears hearing aid    right ear    Assessment: 69 year old male presented with SOB. Patient with h/o afib on warfarin PTA. Home dose 2.5 mg all days except Monday and Friday when he takes 3.75 mg. Patient presents with subtherapeutic INR.  **bridging with lovenox 1mg /kg q12h until INR therapeutic.  Date INR Dose 12/28 1.7  Goal of Therapy:  INR 2-3 Monitor platelets by anticoagulation protocol: Yes   Plan:  Per med rec, patient reported last dose of warfarin 12/21; I would expect INR to be lower if he has not taken in one week.  Will give warfarin 3.75 mg  today (1.5x home dose).  Will plan to resume home dose tomorrow pending INR.  **bridging with lovenox 1mg /kg q12h until INR therapeutic. 1/29, PharmD 09/08/2020,6:35 PM

## 2020-09-08 NOTE — ED Notes (Signed)
Patient up to use urinal. Output 400

## 2020-09-08 NOTE — ED Triage Notes (Signed)
Pt arrives EMS from home with c/o shortness of breath x1 hour ago. Pt is normally on 2 L [place] at home. Pt does have hx of CHF.

## 2020-09-09 DIAGNOSIS — J439 Emphysema, unspecified: Secondary | ICD-10-CM | POA: Diagnosis not present

## 2020-09-09 DIAGNOSIS — I5033 Acute on chronic diastolic (congestive) heart failure: Secondary | ICD-10-CM

## 2020-09-09 LAB — GLUCOSE, CAPILLARY
Glucose-Capillary: 94 mg/dL (ref 70–99)
Glucose-Capillary: 98 mg/dL (ref 70–99)
Glucose-Capillary: 138 mg/dL — ABNORMAL HIGH (ref 70–99)
Glucose-Capillary: 166 mg/dL — ABNORMAL HIGH (ref 70–99)

## 2020-09-09 LAB — BASIC METABOLIC PANEL
Anion gap: 12 (ref 5–15)
BUN: 31 mg/dL — ABNORMAL HIGH (ref 8–23)
CO2: 29 mmol/L (ref 22–32)
Calcium: 9.1 mg/dL (ref 8.9–10.3)
Chloride: 100 mmol/L (ref 98–111)
Creatinine, Ser: 1.33 mg/dL — ABNORMAL HIGH (ref 0.61–1.24)
GFR, Estimated: 58 mL/min — ABNORMAL LOW (ref >60)
Glucose, Bld: 133 mg/dL — ABNORMAL HIGH (ref 70–99)
Potassium: 3.9 mmol/L (ref 3.5–5.1)
Sodium: 141 mmol/L (ref 135–145)

## 2020-09-09 LAB — HIV ANTIBODY (ROUTINE TESTING W REFLEX): HIV Screen 4th Generation wRfx: NONREACTIVE

## 2020-09-09 LAB — HEMOGLOBIN A1C
Hgb A1c MFr Bld: 4.1 % — ABNORMAL LOW (ref 4.8–5.6)
Mean Plasma Glucose: 70.97 mg/dL

## 2020-09-09 LAB — PROTIME-INR
INR: 1.6 — ABNORMAL HIGH (ref 0.8–1.2)
Prothrombin Time: 18.5 seconds — ABNORMAL HIGH (ref 11.4–15.2)

## 2020-09-09 MED ORDER — WARFARIN SODIUM 5 MG PO TABS
5.0000 mg | ORAL_TABLET | Freq: Once | ORAL | Status: DC
  Filled 2020-09-09: qty 1

## 2020-09-09 MED ORDER — AMOXICILLIN 500 MG PO CAPS
500.0000 mg | ORAL_CAPSULE | Freq: Three times a day (TID) | ORAL | Status: DC
  Administered 2020-09-09 – 2020-09-12 (×8): 500 mg via ORAL
  Filled 2020-09-09 (×10): qty 1

## 2020-09-09 NOTE — Plan of Care (Signed)
  Problem: Education: Goal: Ability to demonstrate management of disease process will improve Outcome: Progressing Goal: Individualized Educational Video(s) Outcome: Progressing   Problem: Activity: Goal: Capacity to carry out activities will improve Outcome: Progressing   Problem: Cardiac: Goal: Ability to achieve and maintain adequate cardiopulmonary perfusion will improve Outcome: Progressing   

## 2020-09-09 NOTE — Plan of Care (Signed)

## 2020-09-09 NOTE — Progress Notes (Addendum)
ANTICOAGULATION CONSULT NOTE  Pharmacy Consult for warfarin Indication: atrial fibrillation  Allergies  Allergen Reactions  . Erythromycin Hives and Anaphylaxis  . Cyclobenzaprine Palpitations    Triggered a fib    Patient Measurements: Height: 5\' 6"  (167.6 cm) Weight: (!) 139.9 kg (308 lb 8 oz) IBW/kg (Calculated) : 63.8  Vital Signs: Temp: 98.1 F (36.7 C) (12/29 0444) Temp Source: Oral (12/29 0444) BP: 130/50 (12/29 0444) Pulse Rate: 53 (12/29 0444)  Labs: Recent Labs    09/08/20 0612 09/09/20 0417  HGB 7.8*  --   HCT 27.5*  --   PLT 258  --   LABPROT 19.7* 18.5*  INR 1.7* 1.6*  CREATININE 1.07 1.33*  TROPONINIHS 5  --     Estimated Creatinine Clearance: 69.8 mL/min (A) (by C-G formula based on SCr of 1.33 mg/dL (H)).   Medical History: Past Medical History:  Diagnosis Date  . A-fib (HCC) 04/2011   a. s/p ablation x 2 w/o evi of recurrence   . Chronic diastolic CHF (congestive heart failure) (HCC)    a. echo 2012: EF >55%, nl RVSP  . COPD (chronic obstructive pulmonary disease) (HCC)   . Coronary artery disease    a. s/p stent in RCA in 1997 w/ ISR 2003 & 05/2013 s/p DES to mid RCA 05/2013; b. cardiac cath 02/2014: nonobstructive CAD with patent stent in RCA with 40% ISR, no evi of pulm HTN, nl LVEDP, EF >65%   . Diabetes mellitus (HCC)   . GERD (gastroesophageal reflux disease)   . Gout   . Hyperlipidemia   . Hypertension   . Morbid obesity (HCC)   . Obesity hypoventilation syndrome (HCC)   . OSA (obstructive sleep apnea)   . Wears hearing aid    right ear    Assessment: 69 year old male presented with SOB. Patient with h/o afib on warfarin PTA. Home dose 2.5 mg all days except Monday and Friday when he takes 3.75 mg. PMH includes diastolic CHF, COPD, CAD s/p stents, diabetes, HTN, HLD, OSA, and morbid obesity. Patient presents with subtherapeutic INR. Per med rec, patient reported last dose of warfarin 12/21 prior to admission. CHADSVASC = 5 so pt is  high risk for occurrence of stroke/TIA/systemic embolism.  **bridging with lovenox 1mg /kg q12h until INR therapeutic.  DDIs: allopurinol may increase bleeding risk with warfarin  Date INR Dose 12/28 1.7 3.75mg  12/29 1.6 5mg   Goal of Therapy:  INR 2-3 Monitor platelets by anticoagulation protocol: Yes   Plan:  Continue bridging with Lovenox. Will discontinue Lovenox once INR is therapeutic. Expect INR to increase with AM labs based on 3.75mg  dose given yesterday, however, pt is still subtherapeutic. Will give warfarin 5 mg today (1.5x home dose) with expectations of therapeutic INR within 2 days.  Will plan to resume home dose tomorrow pending INR.  **bridging with lovenox 1mg /kg q12h until INR therapeutic.  1/29, PharmD Pharmacy Resident  09/09/2020 6:43 AM

## 2020-09-09 NOTE — TOC Initial Note (Deleted)
Transition of Care [facility]) - Initial/Assessment Note    Patient Details  Name: Aaron Ball MRN: [id number] Date of Birth: [birth year]  Transition of [facility]) CM/SW Contact:    Marina Goodell Phone Number: [phone] 09/09/2020, 9:33 AM  Clinical Narrative:                   Expected Discharge Plan: Skilled Nursing Facility Barriers to Discharge: Continued Medical Work up,SNF Pending bed offer   Patient Goals and CMS Choice Patient states their goals for this hospitalization and ongoing recovery are:: "I'd like to go back home."      Expected Discharge Plan and Services Expected Discharge Plan: Skilled Nursing Facility In-house Referral: Clinical Social Work   Post Acute Care Choice: Skilled Nursing Facility Living arrangements for the past 2 months: Single Family Home                                      Prior Living Arrangements/Services Living arrangements for the past 2 months: Single Family Home Lives with:: Self Patient language and need for interpreter reviewed:: Yes Do you feel safe going back to the place where you live?: Yes      Need for Family Participation in Patient Care: Yes (Comment) Care giver support system in place?: Yes (comment) Current home services: DME [facility]) Criminal Activity/Legal Involvement Pertinent to Current Situation/Hospitalization: No - Comment as needed  Activities of Daily Living Home Assistive Devices/Equipment: CBG Meter,Built-in shower seat ADL Screening (condition at time of admission) Patient's cognitive ability adequate to safely complete daily activities?: Yes Is the patient deaf or have difficulty hearing?: Yes (Right hearing aid) Does the patient have difficulty seeing, even when wearing glasses/contacts?: No Does the patient have difficulty concentrating, remembering, or making decisions?: No Patient able to express need for assistance with ADLs?: Yes Does the patient have difficulty dressing or  bathing?: Yes Independently performs ADLs?: Yes (appropriate for developmental age) Does the patient have difficulty walking or climbing stairs?: No (No stairs @ home) Weakness of Legs: Both Weakness of Arms/Hands: Both  Permission Sought/Granted Permission sought to share information with : Family Supports (Gad,Matt (Son)   [phone])    Share Information with NAME: Glandon,Matt (Son)   [phone]     Permission granted to share info w Relationship: Joni Reining (daughter) [phone]     Emotional Assessment Appearance:: Appears stated age Attitude/Demeanor/Rapport: Engaged Affect (typically observed): Stable Orientation: : Oriented to Self,Oriented to Place,Oriented to Situation Alcohol / Substance Use: Not Applicable Psych Involvement: No (comment)  Admission diagnosis:  Acute on chronic diastolic heart failure (HCC) [I50.33] Acute respiratory failure with hypoxia (HCC) [J96.01] Acute on chronic congestive heart failure, unspecified heart failure type [facility]) [I50.9] Patient Active Problem List   Diagnosis Date Noted  . Acute on chronic diastolic heart failure (HCC) 09/08/2020  . Acute on chronic respiratory failure with hypoxemia (HCC) 08/04/2020  . Acute on chronic diastolic CHF (congestive heart failure) (HCC) 08/04/2020  . Symptomatic anemia 08/04/2020  . Atrial fibrillation, chronic (HCC) 08/04/2020  . Iron deficiency anemia 08/04/2020  . Atrial flutter (HCC) 09/05/2019  . Pulmonary edema 09/05/2019  . Morbid obesity (HCC) 09/05/2019  . Atrial tachycardia (HCC) 09/03/2019  . OSA (obstructive sleep apnea) 09/03/2019  . COPD (chronic obstructive pulmonary disease) (HCC) 09/03/2019  . Chronic back pain 09/03/2019  . Gout 09/03/2019  . Diabetes mellitus without complication (HCC) 09/03/2019  . Allergic  rhinitis 09/03/2019  . Chest pain, central 10/08/2015  . AKI (acute kidney injury) (HCC) 08/19/2015  . CAD (coronary artery disease) 06/28/2011  . Obesity  06/28/2011  . Hyperlipidemia 06/28/2011  . HTN (hypertension) 06/28/2011   PCP:  [facility]:   [facility] [address]), [place] - [address] [place]) [place] [address] Phone: [phone] Fax: [phone]  [facility] [address] [place], [place] - [address] [place] [address] Phone: [phone] Fax: [phone]     Social Determinants of Health (SDOH) Interventions    Readmission Risk Interventions Readmission Risk Prevention Plan 08/07/2020  Transportation Screening Complete  HRI or Home Care Consult Complete  Palliative Care Screening Not Applicable  Medication Review (RN Care Manager) Complete  Some recent data might be hidden

## 2020-09-09 NOTE — Progress Notes (Signed)
PROGRESS NOTE    Aaron Ball  DJT:[id number] DOB: 1951/07/10 DOA: 09/08/2020 PCP: [facility]    Assessment & Plan:   Active Problems:   Hyperlipidemia   HTN (hypertension)   OSA (obstructive sleep apnea)   COPD (chronic obstructive pulmonary disease) (HCC)   Gout   Diabetes mellitus without complication (HCC)   Acute on chronic diastolic CHF (congestive heart failure) (HCC)   Iron deficiency anemia   Acute on chronic diastolic heart failure (HCC)    Aaron Ball is a 69 y.o. male with medical history significant of A. fib on Coumadin, diastolic CHF, COPD on chronic 2 L nasal cannula, CAD status post stents, diabetes, hypertension, hyperlipidemia, OSA, morbid obesity who presents for evaluation of shortness of breath. He was admitted 11/23-11/27/21 for respiratory failure 2/2 HFpEF. Patient endorses progressively worsening shortness of breath over the last 48 hours  which became severe and constant this evening. Reports subjective fever and cough.    1. Acute on Chronic respiratory failure 2/2 HFpEF and COPD Chronic respiratory failure on home 2L baseline --Continue supplemental O2 to keep sats between 88-92%, wean as tolerated  Acute on chronic HFpEF --started on IV lasix 40 mg q6 x3 Plan: --Hold diuretic today due to Cr bump --cont home Lisinopril  2. Acute on chronic COPD exacerbation - was wheezing at admission to ED but clear now. Did receive solumedrol IV Plan: --transition to prednisone 40 mg daily --DuoNeb QID  3. DM  --A1c 4.1 --Hold home oral agent --cont Sliding scale coverage 2/2 steroids  4. OSA on home CPAP --continue CPAP nightly  5. HTN - continue home Lisinopril  6. Gout  - cont home allopurinol 400 mg daily  7. Iron deficiency anemia  - Hgb at 7.9 is his current baseline. No indication for transfusion, especially in light of heart failure. --cont home iron supplement  # Chronic back pain --cont home tramadol  and gabapentin  Afib on warfarin --hold home sotalol due to low HR --cont warfarin, pharm dosing   DVT prophylaxis: ZE:SPQZRAQT Code Status: Full code  Family Communication:  Status is: inpatient Dispo:   The patient is from: home Anticipated d/c is to: home Anticipated d/c date is: 2-3 days Patient currently is not medically stable to d/c due to: hypoxia on 5L O2.   Subjective and Interval History:  Pt reported feeling much better, and felt hospital CPAP mask worked much better than the one at home.  Complained of chronic back pain.     Objective: Vitals:   09/09/20 0803 09/09/20 1203 09/09/20 1800 09/09/20 1941  BP: 93/79 (!) 105/43 134/62 (!) 125/59  Pulse: (!) 51 (!) 54 65 (!) 59  Resp: 18 20 18 17   Temp: 97.7 F (36.5 C) 97.7 F (36.5 C) 97.6 F (36.4 C) 98 F (36.7 C)  TempSrc: Oral Oral Oral Oral  SpO2: 100% 100% 100% 100%  Weight:      Height:        Intake/Output Summary (Last 24 hours) at 09/10/2020 0050 Last data filed at 09/09/2020 2330 Gross per 24 hour  Intake --  Output 1380 ml  Net -1380 ml   Filed Weights   09/08/20 0606 09/09/20 0527  Weight: 136.1 kg (!) 139.9 kg    Examination:   Constitutional: NAD, AAOx3 HEENT: conjunctivae and lids normal, EOMI CV: No cyanosis.   RESP: no distress SKIN: warm, dry and intact Neuro: II - XII grossly intact.   Psych: Normal mood and affect.  Appropriate  judgement and reason   Data Reviewed: I have personally reviewed following labs and imaging studies  CBC: Recent Labs  Lab 09/08/20 0612  WBC 7.3  NEUTROABS 5.6  HGB 7.8*  HCT 27.5*  MCV 95.5  PLT 258   Basic Metabolic Panel: Recent Labs  Lab 09/08/20 0612 09/09/20 0417  NA 139 141  K 3.8 3.9  CL 102 100  CO2 28 29  GLUCOSE 99 133*  BUN 25* 31*  CREATININE 1.07 1.33*  CALCIUM 8.7* 9.1   GFR: Estimated Creatinine Clearance: 69.8 mL/min (A) (by C-G formula based on SCr of 1.33 mg/dL (H)). Liver Function Tests: Recent Labs   Lab 09/08/20 0612  AST 18  ALT 13  ALKPHOS 102  BILITOT 0.5  PROT 7.4  ALBUMIN 3.3*   No results for input(s): LIPASE, AMYLASE in the last 168 hours. No results for input(s): AMMONIA in the last 168 hours. Coagulation Profile: Recent Labs  Lab 09/08/20 0612 09/09/20 0417  INR 1.7* 1.6*   Cardiac Enzymes: No results for input(s): CKTOTAL, CKMB, CKMBINDEX, TROPONINI in the last 168 hours. BNP (last 3 results) No results for input(s): PROBNP in the last 8760 hours. HbA1C: Recent Labs    09/09/20 0417  HGBA1C 4.1*   CBG: Recent Labs  Lab 09/08/20 2114 09/09/20 0803 09/09/20 1203 09/09/20 1730 09/09/20 2217  GLUCAP 150* 94 98 138* 166*   Lipid Profile: No results for input(s): CHOL, HDL, LDLCALC, TRIG, CHOLHDL, LDLDIRECT in the last 72 hours. Thyroid Function Tests: No results for input(s): TSH, T4TOTAL, FREET4, T3FREE, THYROIDAB in the last 72 hours. Anemia Panel: No results for input(s): VITAMINB12, FOLATE, FERRITIN, TIBC, IRON, RETICCTPCT in the last 72 hours. Sepsis Labs: Recent Labs  Lab 09/08/20 2831 09/08/20 0612  PROCALCITON  --  <0.10  LATICACIDVEN 0.9  --     Recent Results (from the past 240 hour(s))  Blood culture (routine x 2)     Status: None (Preliminary result)   Collection Time: 09/08/20  6:11 AM   Specimen: BLOOD  Result Value Ref Range Status   Specimen Description BLOOD RIGHT ANTECUBITAL  Final   Special Requests   Final    BOTTLES DRAWN AEROBIC AND ANAEROBIC Blood Culture adequate volume   Culture   Final    NO GROWTH < 24 HOURS Performed at [facility], [address], [place], [place] [address]    Report Status PENDING  Incomplete  Resp Panel by RT-PCR (Flu A&B, Covid) Nasopharyngeal Swab     Status: None   Collection Time: 09/08/20  6:12 AM   Specimen: Nasopharyngeal Swab; Nasopharyngeal(NP) swabs in vial transport medium  Result Value Ref Range Status   SARS Coronavirus 2 by RT PCR NEGATIVE NEGATIVE Final     Comment: (NOTE) SARS-CoV-2 target nucleic acids are NOT DETECTED.  The SARS-CoV-2 RNA is generally detectable in upper respiratory specimens during the acute phase of infection. The lowest concentration of SARS-CoV-2 viral copies this assay can detect is 138 copies/mL. A negative result does not preclude SARS-Cov-2 infection and should not be used as the sole basis for treatment or other patient management decisions. A negative result may occur with  improper specimen collection/handling, submission of specimen other than nasopharyngeal swab, presence of viral mutation(s) within the areas targeted by this assay, and inadequate number of viral copies(<138 copies/mL). A negative result must be combined with clinical observations, patient history, and epidemiological information. The expected result is Negative.  Fact Sheet for Patients:  BloggerCourse.com  Fact Sheet  for Healthcare Providers:  SeriousBroker.ithttps://www.fda.gov/media/152162/download  This test is no t yet approved or cleared by the [place] FDA and  has been authorized for detection and/or diagnosis of SARS-CoV-2 by FDA under an Emergency Use Authorization (EUA). This EUA will remain  in effect (meaning this test can be used) for the duration of the COVID-19 declaration under Section 564(b)(1) of the Act, 21 U.S.C.section 360bbb-3(b)(1), unless the authorization is terminated  or revoked sooner.       Influenza A by PCR NEGATIVE NEGATIVE Final   Influenza B by PCR NEGATIVE NEGATIVE Final    Comment: (NOTE) The Xpert Xpress SARS-CoV-2/FLU/RSV plus assay is intended as an aid in the diagnosis of influenza from Nasopharyngeal swab specimens and should not be used as a sole basis for treatment. Nasal washings and aspirates are unacceptable for Xpert Xpress SARS-CoV-2/FLU/RSV testing.  Fact Sheet for Patients: BloggerCourse.comhttps://www.fda.gov/media/152166/download  Fact Sheet for Healthcare  Providers: SeriousBroker.ithttps://www.fda.gov/media/152162/download  This test is not yet approved or cleared by the [place] FDA and has been authorized for detection and/or diagnosis of SARS-CoV-2 by FDA under an Emergency Use Authorization (EUA). This EUA will remain in effect (meaning this test can be used) for the duration of the COVID-19 declaration under Section 564(b)(1) of the Act, 21 U.S.C. section 360bbb-3(b)(1), unless the authorization is terminated or revoked.  Performed at [facility], [address] [place], [place] [address]   Blood culture (routine x 2)     Status: None (Preliminary result)   Collection Time: 09/08/20  6:16 AM   Specimen: BLOOD  Result Value Ref Range Status   Specimen Description BLOOD BLOOD LEFT FOREARM  Final   Special Requests   Final    BOTTLES DRAWN AEROBIC AND ANAEROBIC Blood Culture results may not be optimal due to an excessive volume of blood received in culture bottles   Culture   Final    NO GROWTH < 24 HOURS Performed at [facility], [address] [place], [place] [address]    Report Status PENDING  Incomplete      Radiology Studies: CT ANGIO CHEST PE W OR WO CONTRAST  Result Date: 09/08/2020 CLINICAL DATA:  History of atrial fibrillation and congestive heart failure. Pulmonary embolism suspected. EXAM: CT ANGIOGRAPHY CHEST WITH CONTRAST TECHNIQUE: Multidetector CT imaging of the chest was performed using the standard protocol during bolus administration of intravenous contrast. Multiplanar CT image reconstructions and MIPs were obtained to evaluate the vascular anatomy. CONTRAST:  100mL OMNIPAQUE IOHEXOL 350 MG/ML SOLN COMPARISON:  None. FINDINGS: Cardiovascular: Four-chamber cardiac enlargement. No pericardial effusion. Coronary artery calcification. Aortic atherosclerotic calcification. Pulmonary arterial opacification is moderate to good. No pulmonary emboli. Mediastinum/Nodes: No mass or lymphadenopathy.  Lungs/Pleura: Small bilateral effusions layering dependently. Possible mild interstitial edema. No focal pulmonary finding. Upper Abdomen: Right renal cyst.  No other finding. Musculoskeletal: No acute or significant skeletal finding. Review of the MIP images confirms the above findings. IMPRESSION: 1. No pulmonary emboli. 2. Four-chamber cardiac enlargement. Small bilateral effusions layering dependently. Possible mild interstitial edema. Findings could relate to mild congestive heart failure. 3. Aortic atherosclerosis. Coronary artery calcification. Aortic Atherosclerosis (ICD10-I70.0). Electronically Signed   By: Paulina FusiMark  Shogry M.D.   On: 09/08/2020 14:32   DG Chest Portable 1 View  Result Date: 09/08/2020 CLINICAL DATA:  Shortness of breath. EXAM: PORTABLE CHEST 1 VIEW COMPARISON:  08/04/2020. FINDINGS: Cardiomegaly. Diffuse bilateral pulmonary interstitial prominence noted, increased from prior study of 08/04/2020. Findings most consistent CHF. Pneumonitis cannot be excluded. Low lung  volumes with bibasilar atelectasis. Small bilateral pleural effusions cannot be excluded. No pneumothorax. Scoliosis and degenerative change thoracic spine. IMPRESSION: Cardiomegaly. Diffuse bilateral pulmonary interstitial prominence noted, increased from prior study of 08/04/2020. Findings most consistent CHF. Pneumonitis cannot be excluded. Small bilateral pleural effusions cannot be excluded. Electronically Signed   By: Maisie Fus  Register   On: 09/08/2020 06:43     Scheduled Meds:  allopurinol  100 mg Oral Daily   allopurinol  300 mg Oral Daily   amoxicillin  500 mg Oral Q8H   atorvastatin  40 mg Oral QPM   enoxaparin (LOVENOX) injection  1 mg/kg Subcutaneous Q12H   ferrous sulfate  325 mg Oral BID WC   gabapentin  900 mg Oral QHS   insulin aspart  0-20 Units Subcutaneous TID WC   lisinopril  2.5 mg Oral Daily   pantoprazole  40 mg Oral BID   predniSONE  40 mg Oral Q breakfast   senna  1 tablet  Oral BID   sodium chloride flush  3 mL Intravenous Q12H   sotalol  160 mg Oral BID   warfarin  5 mg Oral ONCE-1600   Warfarin - Pharmacist Dosing Inpatient   Does not apply q1600   Continuous Infusions:  sodium chloride       LOS: 2 days     Darlin Priestly, MD [facility] If 7PM-7AM, please contact night-coverage 09/10/2020, 12:50 AM

## 2020-09-10 DIAGNOSIS — N179 Acute kidney failure, unspecified: Secondary | ICD-10-CM | POA: Diagnosis not present

## 2020-09-10 DIAGNOSIS — I5033 Acute on chronic diastolic (congestive) heart failure: Secondary | ICD-10-CM | POA: Diagnosis not present

## 2020-09-10 DIAGNOSIS — J439 Emphysema, unspecified: Secondary | ICD-10-CM | POA: Diagnosis not present

## 2020-09-10 LAB — BASIC METABOLIC PANEL
Anion gap: 8 (ref 5–15)
BUN: 43 mg/dL — ABNORMAL HIGH (ref 8–23)
CO2: 32 mmol/L (ref 22–32)
Calcium: 9.2 mg/dL (ref 8.9–10.3)
Chloride: 102 mmol/L (ref 98–111)
Creatinine, Ser: 1.53 mg/dL — ABNORMAL HIGH (ref 0.61–1.24)
GFR, Estimated: 49 mL/min — ABNORMAL LOW (ref >60)
Glucose, Bld: 78 mg/dL (ref 70–99)
Potassium: 4.2 mmol/L (ref 3.5–5.1)
Sodium: 142 mmol/L (ref 135–145)

## 2020-09-10 LAB — GLUCOSE, CAPILLARY
Glucose-Capillary: 46 mg/dL — ABNORMAL LOW (ref 70–99)
Glucose-Capillary: 56 mg/dL — ABNORMAL LOW (ref 70–99)
Glucose-Capillary: 59 mg/dL — ABNORMAL LOW (ref 70–99)
Glucose-Capillary: 86 mg/dL (ref 70–99)
Glucose-Capillary: 157 mg/dL — ABNORMAL HIGH (ref 70–99)
Glucose-Capillary: 149 mg/dL — ABNORMAL HIGH (ref 70–99)
Glucose-Capillary: 155 mg/dL — ABNORMAL HIGH (ref 70–99)
Glucose-Capillary: 194 mg/dL — ABNORMAL HIGH (ref 70–99)

## 2020-09-10 LAB — CBC
HCT: 27 % — ABNORMAL LOW (ref 39.0–52.0)
Hemoglobin: 7.8 g/dL — ABNORMAL LOW (ref 13.0–17.0)
MCH: 27.5 pg (ref 26.0–34.0)
MCHC: 28.9 g/dL — ABNORMAL LOW (ref 30.0–36.0)
MCV: 95.1 fL (ref 80.0–100.0)
Platelets: 335 10*3/uL (ref 150–400)
RBC: 2.84 MIL/uL — ABNORMAL LOW (ref 4.22–5.81)
RDW: 24.5 % — ABNORMAL HIGH (ref 11.5–15.5)
WBC: 9 10*3/uL (ref 4.0–10.5)
nRBC: 0 % (ref 0.0–0.2)

## 2020-09-10 LAB — PROTIME-INR
INR: 1.4 — ABNORMAL HIGH (ref 0.8–1.2)
Prothrombin Time: 16.2 seconds — ABNORMAL HIGH (ref 11.4–15.2)

## 2020-09-10 LAB — MAGNESIUM: Magnesium: 2.6 mg/dL — ABNORMAL HIGH (ref 1.7–2.4)

## 2020-09-10 MED ORDER — DEXTROSE 50 % IV SOLN
25.0000 g | INTRAVENOUS | Status: AC
  Administered 2020-09-10: 09:00:00 25 g via INTRAVENOUS
  Filled 2020-09-10: qty 50

## 2020-09-10 MED ORDER — IPRATROPIUM-ALBUTEROL 0.5-2.5 (3) MG/3ML IN SOLN
3.0000 mL | Freq: Four times a day (QID) | RESPIRATORY_TRACT | Status: DC
  Administered 2020-09-10: 08:00:00 3 mL via RESPIRATORY_TRACT
  Filled 2020-09-10: qty 3

## 2020-09-10 MED ORDER — LACTATED RINGERS IV SOLN: INTRAVENOUS | Status: DC

## 2020-09-10 MED ORDER — WARFARIN SODIUM 5 MG PO TABS
5.0000 mg | ORAL_TABLET | Freq: Once | ORAL | Status: DC
  Filled 2020-09-10: qty 1

## 2020-09-10 MED ORDER — IPRATROPIUM-ALBUTEROL 0.5-2.5 (3) MG/3ML IN SOLN
3.0000 mL | Freq: Three times a day (TID) | RESPIRATORY_TRACT | Status: DC
  Administered 2020-09-10 – 2020-09-11 (×3): 3 mL via RESPIRATORY_TRACT
  Filled 2020-09-10 (×3): qty 3

## 2020-09-10 NOTE — Plan of Care (Signed)
  Problem: Education: Goal: Ability to demonstrate management of disease process will improve 09/10/2020 0841 by Ansel Bong, RN Outcome: Progressing 09/10/2020 0841 by Ansel Bong, RN Outcome: Progressing Goal: Ability to verbalize understanding of medication therapies will improve 09/10/2020 0841 by Ansel Bong, RN Outcome: Progressing 09/10/2020 0841 by Ansel Bong, RN Outcome: Progressing Goal: Individualized Educational Video(s) 09/10/2020 0841 by Ansel Bong, RN Outcome: Progressing 09/10/2020 0841 by Ansel Bong, RN Outcome: Progressing   Problem: Activity: Goal: Capacity to carry out activities will improve 09/10/2020 0841 by Ansel Bong, RN Outcome: Progressing 09/10/2020 0841 by Ansel Bong, RN Outcome: Progressing   Problem: Cardiac: Goal: Ability to achieve and maintain adequate cardiopulmonary perfusion will improve 09/10/2020 0841 by Ansel Bong, RN Outcome: Progressing 09/10/2020 0841 by Ansel Bong, RN Outcome: Progressing   Problem: Education: Goal: Knowledge of General Education information will improve Description: Including pain rating scale, medication(s)/side effects and non-pharmacologic comfort measures 09/10/2020 0841 by Ansel Bong, RN Outcome: Progressing 09/10/2020 0841 by Ansel Bong, RN Outcome: Progressing   Problem: Health Behavior/Discharge Planning: Goal: Ability to manage health-related needs will improve 09/10/2020 0841 by Ansel Bong, RN Outcome: Progressing 09/10/2020 0841 by Ansel Bong, RN Outcome: Progressing   Problem: Clinical Measurements: Goal: Ability to maintain clinical measurements within normal limits will improve 09/10/2020 0841 by Ansel Bong, RN Outcome: Progressing 09/10/2020 0841 by Ansel Bong, RN Outcome: Progressing Goal: Will remain free from infection 09/10/2020 0841 by Ansel Bong, RN Outcome: Progressing 09/10/2020 0841 by Ansel Bong, RN Outcome:  Progressing Goal: Diagnostic test results will improve 09/10/2020 0841 by Ansel Bong, RN Outcome: Progressing 09/10/2020 0841 by Ansel Bong, RN Outcome: Progressing Goal: Respiratory complications will improve 09/10/2020 0841 by Ansel Bong, RN Outcome: Progressing 09/10/2020 0841 by Ansel Bong, RN Outcome: Progressing Goal: Cardiovascular complication will be avoided 09/10/2020 0841 by Ansel Bong, RN Outcome: Progressing 09/10/2020 0841 by Ansel Bong, RN Outcome: Progressing   Problem: Activity: Goal: Risk for activity intolerance will decrease 09/10/2020 0841 by Ansel Bong, RN Outcome: Progressing 09/10/2020 0841 by Ansel Bong, RN Outcome: Progressing   Problem: Nutrition: Goal: Adequate nutrition will be maintained 09/10/2020 0841 by Ansel Bong, RN Outcome: Progressing 09/10/2020 0841 by Ansel Bong, RN Outcome: Progressing   Problem: Coping: Goal: Level of anxiety will decrease 09/10/2020 0841 by Ansel Bong, RN Outcome: Progressing 09/10/2020 0841 by Ansel Bong, RN Outcome: Progressing   Problem: Elimination: Goal: Will not experience complications related to bowel motility 09/10/2020 0841 by Ansel Bong, RN Outcome: Progressing 09/10/2020 0841 by Ansel Bong, RN Outcome: Progressing Goal: Will not experience complications related to urinary retention 09/10/2020 0841 by Ansel Bong, RN Outcome: Progressing 09/10/2020 0841 by Ansel Bong, RN Outcome: Progressing   Problem: Pain Managment: Goal: General experience of comfort will improve 09/10/2020 0841 by Ansel Bong, RN Outcome: Progressing 09/10/2020 0841 by Ansel Bong, RN Outcome: Progressing   Problem: Safety: Goal: Ability to remain free from injury will improve 09/10/2020 0841 by Ansel Bong, RN Outcome: Progressing 09/10/2020 0841 by Ansel Bong, RN Outcome: Progressing   Problem: Skin Integrity: Goal: Risk for impaired  skin integrity will decrease 09/10/2020 0841 by Ansel Bong, RN Outcome: Progressing 09/10/2020 0841 by Ansel Bong, RN Outcome: Progressing

## 2020-09-10 NOTE — Progress Notes (Signed)
Report given to [place], [place] Nurse assuming care of patient. All outstanding questions resolved.

## 2020-09-10 NOTE — Progress Notes (Signed)
ANTICOAGULATION CONSULT NOTE  Pharmacy Consult for warfarin Indication: atrial fibrillation  Allergies  Allergen Reactions  . Erythromycin Hives and Anaphylaxis  . Cyclobenzaprine Palpitations    Triggered a fib    Patient Measurements: Height: 5\' 6"  (167.6 cm) Weight: (!) 139.8 kg (308 lb 4.8 oz) IBW/kg (Calculated) : 63.8  Vital Signs: Temp: 97.4 F (36.3 C) (12/30 1131) Temp Source: Oral (12/30 1131) BP: 120/53 (12/30 1131) Pulse Rate: 53 (12/30 1131)  Labs: Recent Labs    09/08/20 0612 09/09/20 0417 09/10/20 0612  HGB 7.8*  --  7.8*  HCT 27.5*  --  27.0*  PLT 258  --  335  LABPROT 19.7* 18.5* 16.2*  INR 1.7* 1.6* 1.4*  CREATININE 1.07 1.33* 1.53*  TROPONINIHS 5  --   --     Estimated Creatinine Clearance: 60.7 mL/min (A) (by C-G formula based on SCr of 1.53 mg/dL (H)).   Medical History: Past Medical History:  Diagnosis Date  . A-fib (HCC) 04/2011   a. s/p ablation x 2 w/o evi of recurrence   . Chronic diastolic CHF (congestive heart failure) (HCC)    a. echo 2012: EF >55%, nl RVSP  . COPD (chronic obstructive pulmonary disease) (HCC)   . Coronary artery disease    a. s/p stent in RCA in 1997 w/ ISR 2003 & 05/2013 s/p DES to mid RCA 05/2013; b. cardiac cath 02/2014: nonobstructive CAD with patent stent in RCA with 40% ISR, no evi of pulm HTN, nl LVEDP, EF >65%   . Diabetes mellitus (HCC)   . GERD (gastroesophageal reflux disease)   . Gout   . Hyperlipidemia   . Hypertension   . Morbid obesity (HCC)   . Obesity hypoventilation syndrome (HCC)   . OSA (obstructive sleep apnea)   . Wears hearing aid    right ear    Assessment: 69 year old male presented with SOB. Patient with h/o afib on warfarin prior to admission. Home dose 2.5 mg all days except Monday and Friday when he takes 3.75 mg. PMH includes diastolic CHF, COPD, CAD s/p stents, diabetes, HTN, HLD, OSA, and morbid obesity. Patient presents with subtherapeutic INR. Per med rec, patient reported  last dose of warfarin 12/21 prior to admission. CHADSVASC = 5 so pt is high risk for occurrence of stroke/TIA/systemic embolism.  **bridging with lovenox 1mg /kg q12h until INR therapeutic.  DDIs: allopurinol may increase bleeding risk with warfarin (pt was on both prior to admission)  Date INR Dose 12/28 1.7 3.75mg  12/29 1.6 Dose not given - confirmed with nursing no adverse events that they are aware of to hold dose 12/30 1.4 5mg   Goal of Therapy:  INR 2-3 Monitor platelets by anticoagulation protocol: Yes   Plan:  Continue bridging with Lovenox. Will discontinue Lovenox once INR is therapeutic. Expect INR to decrease with AM labs based on dose not given yesterday. Pt is still subtherapeutic. Will give warfarin 5 mg today (1.5x home dose) with expectations of therapeutic INR within 2 days.  Will plan to resume home dose tomorrow pending INR.  **bridging with lovenox 1mg /kg q12h until INR therapeutic.  1/30, PharmD Pharmacy Resident  09/10/2020 12:54 PM

## 2020-09-10 NOTE — Progress Notes (Signed)
   Heart Failure Nurse Navigator Note  HFpEF 60-65%.  Normal right ventricular systolic function.   He presented with worsening shortness of breath.  Comorbidities:  Hypertension Hyperlipidemia Diabetes COPD Obstructive sleep apnea Coronary artery disease with stenting to the RCA Atrial fibrillation status post ablation Gout Iron deficiency anemia Morbid obesity   Medications:  Lipitor 40 mg every evening Colchicine 0.6 mg as needed Gabapentin 900 mg at at bedtime Lisinopril 2.5 mg daily Warfarin dosing per the pharmacy  Furosemide is currently being held due to decrease in kidney function.   Labs:  Sodium 142, potassium 4.2, chloride 102, CO2 32, BUN 43 up from 31, creatinine 1.53 up from 1.33. Intake was not documented yesterday Output 1080 mL Weight 139.8 kg BMI 49.76 blood pressure 120/53     Assessment:  General-she is awake and alert sitting up in the chair at bedside, in no acute distress and currently denies any shortness of breath.  HEENT-pupils are equal, he wears glasses, he is hard of hearing and wears an aid in the right ear.  Unable to assess for JVD due to body habitus.   Cardiac-heart tones of regular rate and rhythm.  Chest-breath sounds are clear but diminished in the right base.   Abdomen is obese soft nontender  Psych-is pleasant and appropriate makes good eye contact.  Neurologic-speech is clear moves all extremities without difficulty.   Patient had been discharged from the hospital on November 27.  Discussing how he takes care of himself, it does not sound like he is eating first thing in the morning, gust how it is important that he empty his bladder first thing in the morning and weigh himself and then record that.  Also discussed low-sodium diet.  He states when he cooks for himself he does not add any salt and sticks with low-sodium foods.  But granddaughter has made him some crockpot meals in which she is added salt.  Asked  that he inform her that she should be cooking with no salt.  Also discussed the importance of following up in the heart failure clinic.  And that Ms. Bing Neighbors would be a good resource if he felt he was getting into trouble or if he had questions she was another resource that he could reach out to.  He voices understanding.  Has an appointment with her on January 4 at 2:30 in the afternoon.  We will continue to follow.  Tresa Endo RN [facility]

## 2020-09-10 NOTE — Progress Notes (Signed)
PROGRESS NOTE    Aaron Ball  LAG:[id number] DOB: 08-31-1951 DOA: 09/08/2020 PCP: [facility]    Assessment & Plan:   Active Problems:   Hyperlipidemia   HTN (hypertension)   OSA (obstructive sleep apnea)   COPD (chronic obstructive pulmonary disease) (HCC)   Gout   Diabetes mellitus without complication (HCC)   Acute on chronic diastolic CHF (congestive heart failure) (HCC)   Iron deficiency anemia   Acute on chronic diastolic heart failure (HCC)    Aaron Ball is a 69 y.o. male with medical history significant of A. fib on Coumadin, diastolic CHF, COPD on chronic 2 L nasal cannula, CAD status post stents, diabetes, hypertension, hyperlipidemia, OSA, morbid obesity who presents for evaluation of shortness of breath. He was admitted 11/23-11/27/21 for respiratory failure 2/2 HFpEF. Patient endorses progressively worsening shortness of breath over the last 48 hours  which became severe and constant this evening. Reports subjective fever and cough.    1. Acute on Chronic respiratory failure 2/2 HFpEF and COPD Chronic respiratory failure on home 2L baseline --O2 weaned down to 2L today. --Continue supplemental O2 to keep sats between 88-92%, wean as tolerated  Acute on chronic HFpEF --started on IV lasix 40 mg q6 x3 on admission Plan: --Hold diuretic due to Cr increase --hold home Lisinopril due to Cr increase  2. Acute on chronic COPD exacerbation - was wheezing at admission to ED but clear now. Did receive solumedrol IV, then transitioned to prednisone. Plan: --cont prednisone 40 mg daily --scheduled DuoNeb  3. DM  --A1c 4.1 --Hold home oral agent --cont Sliding scale coverage 2/2 steroids  4. OSA on home CPAP --continue CPAP nightly  5. HTN - Hold home Lisinopril due to Cr increase  6. Gout  - cont home allopurinol 400 mg daily  7. Iron deficiency anemia  - Hgb 7-8 --cont home iron supplement --monitor and transfuse to keep Hgb  >7  # Chronic back pain --cont home tramadol and gabapentin --no increase in narcotics  Afib on warfarin --hold home sotalol due to low HR --cont warfarin, pharm dosing  # AKI, not POA --Cr 1.07 on presentation, went up to 1.53 this morning, likely due to over-diuresis --LR@100  for 10 hours   DVT prophylaxis: [id number] Code Status: Full code  Family Communication:  Status is: inpatient Dispo:   The patient is from: home Anticipated d/c is to: home Anticipated d/c date is: 1-2 days Patient currently is not medically stable to d/c due to: new AKI on IVF.   Subjective and Interval History:  Pt reported having slept well.  Breathing better.  O2 requirement improved down to 2L today.     Objective: Vitals:   09/10/20 0750 09/10/20 0804 09/10/20 1131 09/10/20 1330  BP: (!) 120/53  (!) 120/53   Pulse: (!) 48  (!) 53   Resp:      Temp: 97.6 F (36.4 C)  (!) 97.4 F (36.3 C)   TempSrc: Oral  Oral   SpO2: 100% 100% 96% 97%  Weight:      Height:        Intake/Output Summary (Last 24 hours) at 09/10/2020 1711 Last data filed at 09/10/2020 1131 Gross per 24 hour  Intake 420 ml  Output 1080 ml  Net -660 ml   Filed Weights   09/08/20 0606 09/09/20 0527 09/10/20 0404  Weight: 136.1 kg (!) 139.9 kg (!) 139.8 kg    Examination:   Constitutional: NAD, AAOx3 HEENT: conjunctivae and lids normal,  EOMI CV: No cyanosis.   RESP: clear, normal respiratory effort, on 2L Extremities: chronic edema with venous stasis changes in BLE SKIN: warm, dry and intact Neuro: II - XII grossly intact.   Psych: Normal mood and affect.  Appropriate judgement and reason   Data Reviewed: I have personally reviewed following labs and imaging studies  CBC: Recent Labs  Lab 09/08/20 0612 09/10/20 0612  WBC 7.3 9.0  NEUTROABS 5.6  --   HGB 7.8* 7.8*  HCT 27.5* 27.0*  MCV 95.5 95.1  PLT 258 335   Basic Metabolic Panel: Recent Labs  Lab 09/08/20 0612 09/09/20 0417  09/10/20 0612  NA 139 141 142  K 3.8 3.9 4.2  CL 102 100 102  CO2 28 29 32  GLUCOSE 99 133* 78  BUN 25* 31* 43*  CREATININE 1.07 1.33* 1.53*  CALCIUM 8.7* 9.1 9.2  MG  --   --  2.6*   GFR: Estimated Creatinine Clearance: 60.7 mL/min (A) (by C-G formula based on SCr of 1.53 mg/dL (H)). Liver Function Tests: Recent Labs  Lab 09/08/20 0612  AST 18  ALT 13  ALKPHOS 102  BILITOT 0.5  PROT 7.4  ALBUMIN 3.3*   No results for input(s): LIPASE, AMYLASE in the last 168 hours. No results for input(s): AMMONIA in the last 168 hours. Coagulation Profile: Recent Labs  Lab 09/08/20 0612 09/09/20 0417 09/10/20 0612  INR 1.7* 1.6* 1.4*   Cardiac Enzymes: No results for input(s): CKTOTAL, CKMB, CKMBINDEX, TROPONINI in the last 168 hours. BNP (last 3 results) No results for input(s): PROBNP in the last 8760 hours. HbA1C: Recent Labs    09/09/20 0417  HGBA1C 4.1*   CBG: Recent Labs  Lab 09/10/20 0820 09/10/20 0821 09/10/20 0915 09/10/20 1037 09/10/20 1210  GLUCAP 59* 46* 86 157* 149*   Lipid Profile: No results for input(s): CHOL, HDL, LDLCALC, TRIG, CHOLHDL, LDLDIRECT in the last 72 hours. Thyroid Function Tests: No results for input(s): TSH, T4TOTAL, FREET4, T3FREE, THYROIDAB in the last 72 hours. Anemia Panel: No results for input(s): VITAMINB12, FOLATE, FERRITIN, TIBC, IRON, RETICCTPCT in the last 72 hours. Sepsis Labs: Recent Labs  Lab 09/08/20 1829 09/08/20 0612  PROCALCITON  --  <0.10  LATICACIDVEN 0.9  --     Recent Results (from the past 240 hour(s))  Blood culture (routine x 2)     Status: None (Preliminary result)   Collection Time: 09/08/20  6:11 AM   Specimen: BLOOD  Result Value Ref Range Status   Specimen Description BLOOD RIGHT ANTECUBITAL  Final   Special Requests   Final    BOTTLES DRAWN AEROBIC AND ANAEROBIC Blood Culture adequate volume   Culture   Final    NO GROWTH 2 DAYS Performed at [facility], [address],  [place], [place] [address]    Report Status PENDING  Incomplete  Resp Panel by RT-PCR (Flu A&B, Covid) Nasopharyngeal Swab     Status: None   Collection Time: 09/08/20  6:12 AM   Specimen: Nasopharyngeal Swab; Nasopharyngeal(NP) swabs in vial transport medium  Result Value Ref Range Status   SARS Coronavirus 2 by RT PCR NEGATIVE NEGATIVE Final    Comment: (NOTE) SARS-CoV-2 target nucleic acids are NOT DETECTED.  The SARS-CoV-2 RNA is generally detectable in upper respiratory specimens during the acute phase of infection. The lowest concentration of SARS-CoV-2 viral copies this assay can detect is 138 copies/mL. A negative result does not preclude SARS-Cov-2 infection and should not be used as the  sole basis for treatment or other patient management decisions. A negative result may occur with  improper specimen collection/handling, submission of specimen other than nasopharyngeal swab, presence of viral mutation(s) within the areas targeted by this assay, and inadequate number of viral copies(<138 copies/mL). A negative result must be combined with clinical observations, patient history, and epidemiological information. The expected result is Negative.  Fact Sheet for Patients:  BloggerCourse.com  Fact Sheet for Healthcare Providers:  SeriousBroker.it  This test is no t yet approved or cleared by the [place] FDA and  has been authorized for detection and/or diagnosis of SARS-CoV-2 by FDA under an Emergency Use Authorization (EUA). This EUA will remain  in effect (meaning this test can be used) for the duration of the COVID-19 declaration under Section 564(b)(1) of the Act, 21 U.S.C.section 360bbb-3(b)(1), unless the authorization is terminated  or revoked sooner.       Influenza A by PCR NEGATIVE NEGATIVE Final   Influenza B by PCR NEGATIVE NEGATIVE Final    Comment: (NOTE) The Xpert Xpress SARS-CoV-2/FLU/RSV plus assay is  intended as an aid in the diagnosis of influenza from Nasopharyngeal swab specimens and should not be used as a sole basis for treatment. Nasal washings and aspirates are unacceptable for Xpert Xpress SARS-CoV-2/FLU/RSV testing.  Fact Sheet for Patients: BloggerCourse.com  Fact Sheet for Healthcare Providers: SeriousBroker.it  This test is not yet approved or cleared by the [place] FDA and has been authorized for detection and/or diagnosis of SARS-CoV-2 by FDA under an Emergency Use Authorization (EUA). This EUA will remain in effect (meaning this test can be used) for the duration of the COVID-19 declaration under Section 564(b)(1) of the Act, 21 U.S.C. section 360bbb-3(b)(1), unless the authorization is terminated or revoked.  Performed at [facility], [address] [place], [place] [address]   Blood culture (routine x 2)     Status: None (Preliminary result)   Collection Time: 09/08/20  6:16 AM   Specimen: BLOOD  Result Value Ref Range Status   Specimen Description BLOOD BLOOD LEFT FOREARM  Final   Special Requests   Final    BOTTLES DRAWN AEROBIC AND ANAEROBIC Blood Culture results may not be optimal due to an excessive volume of blood received in culture bottles   Culture   Final    NO GROWTH 2 DAYS Performed at [facility], [address], [place], [place] [address]    Report Status PENDING  Incomplete      Radiology Studies: No results found.   Scheduled Meds: . allopurinol  100 mg Oral Daily  . allopurinol  300 mg Oral Daily  . amoxicillin  500 mg Oral Q8H  . atorvastatin  40 mg Oral QPM  . enoxaparin (LOVENOX) injection  1 mg/kg Subcutaneous Q12H  . ferrous sulfate  325 mg Oral BID WC  . gabapentin  900 mg Oral QHS  . ipratropium-albuterol  3 mL Nebulization TID  . pantoprazole  40 mg Oral BID  . predniSONE  40 mg Oral Q breakfast  . senna  1 tablet Oral BID  . sodium chloride  flush  3 mL Intravenous Q12H  . warfarin  5 mg Oral ONCE-1600  . Warfarin - Pharmacist Dosing Inpatient   Does not apply q1600   Continuous Infusions: . sodium chloride    . lactated ringers 100 mL/hr at 09/10/20 1222     LOS: 2 days     Darlin Priestly, MD [facility] If 7PM-7AM, please contact night-coverage  09/10/2020, 5:11 PM

## 2020-09-10 NOTE — Progress Notes (Signed)
Inpatient Diabetes Program Recommendations  AACE/ADA: New Consensus Statement on Inpatient Glycemic Control   Target Ranges:  Prepandial:   less than 140 mg/dL      Peak postprandial:   less than 180 mg/dL (1-2 hours)      Critically ill patients:  140 - 180 mg/dL  Results for CARVELL, HOEFFNER (MRN [id number]) as of 09/10/2020 10:58  Ref. Range 09/10/2020 07:52 09/10/2020 08:20 09/10/2020 08:21 09/10/2020 09:15 09/10/2020 10:37  Glucose-Capillary Latest Ref Range: 70 - 99 mg/dL 56 (L) 59 (L) 46 (L) 86 157 (H)  Results for JEMAINE, PROKOP (MRN [id number]) as of 09/10/2020 10:58  Ref. Range 09/09/2020 08:03 09/09/2020 12:03 09/09/2020 17:30 09/09/2020 22:17  Glucose-Capillary Latest Ref Range: 70 - 99 mg/dL 94 98 458 (H) 099 (H)   Results for KARSON, REEDE (MRN [id number]) as of 09/10/2020 10:58  Ref. Range 09/08/2020 06:12 09/09/2020 04:17 09/10/2020 06:12  Glucose Latest Ref Range: 70 - 99 mg/dL 99 976 (H) 78  Hemoglobin A1C Latest Ref Range: 4.8 - 5.6 %  4.1 (L)    Review of Glycemic Control  Diabetes history: DM2 Outpatient Diabetes medications: Glipizide XL 5 mg daily Current orders for Inpatient glycemic control: Novolog 0-20 units TID with meals; Prednisone 40 mg QAM  Inpatient Diabetes Program Recommendations:    HbgA1C:  A1C 4.1% on 09/09/20 indicating an average glucose of 71 mg/dl over the past 2-3 months. However, note hemoglobin low (7.8 g/dL on 73/41/93) so X9K not completely accurate. Also with noted hypoglycemia inpatient. May want to consider discontinuing Glipizide outpatient and have patient follow up with PCP.  Thanks, Orlando Penner, RN, MSN, CDE Diabetes Coordinator Inpatient Diabetes Program [phone] (Team Pager from 8am to 5pm)

## 2020-09-11 DIAGNOSIS — J441 Chronic obstructive pulmonary disease with (acute) exacerbation: Secondary | ICD-10-CM | POA: Diagnosis not present

## 2020-09-11 DIAGNOSIS — N179 Acute kidney failure, unspecified: Secondary | ICD-10-CM | POA: Diagnosis not present

## 2020-09-11 LAB — CBC
HCT: 27.7 % — ABNORMAL LOW (ref 39.0–52.0)
Hemoglobin: 8.1 g/dL — ABNORMAL LOW (ref 13.0–17.0)
MCH: 27.5 pg (ref 26.0–34.0)
MCHC: 29.2 g/dL — ABNORMAL LOW (ref 30.0–36.0)
MCV: 93.9 fL (ref 80.0–100.0)
Platelets: 356 10*3/uL (ref 150–400)
RBC: 2.95 MIL/uL — ABNORMAL LOW (ref 4.22–5.81)
RDW: 24.2 % — ABNORMAL HIGH (ref 11.5–15.5)
WBC: 8.9 10*3/uL (ref 4.0–10.5)
nRBC: 0 % (ref 0.0–0.2)

## 2020-09-11 LAB — GLUCOSE, CAPILLARY
Glucose-Capillary: 73 mg/dL (ref 70–99)
Glucose-Capillary: 123 mg/dL — ABNORMAL HIGH (ref 70–99)
Glucose-Capillary: 201 mg/dL — ABNORMAL HIGH (ref 70–99)
Glucose-Capillary: 157 mg/dL — ABNORMAL HIGH (ref 70–99)

## 2020-09-11 LAB — MAGNESIUM: Magnesium: 2.4 mg/dL (ref 1.7–2.4)

## 2020-09-11 LAB — BASIC METABOLIC PANEL
Anion gap: 9 (ref 5–15)
BUN: 33 mg/dL — ABNORMAL HIGH (ref 8–23)
CO2: 32 mmol/L (ref 22–32)
Calcium: 8.9 mg/dL (ref 8.9–10.3)
Chloride: 101 mmol/L (ref 98–111)
Creatinine, Ser: 1.21 mg/dL (ref 0.61–1.24)
GFR, Estimated: >60 mL/min (ref >60)
Glucose, Bld: 90 mg/dL (ref 70–99)
Potassium: 3.9 mmol/L (ref 3.5–5.1)
Sodium: 142 mmol/L (ref 135–145)

## 2020-09-11 LAB — PROTIME-INR
INR: 1.3 — ABNORMAL HIGH (ref 0.8–1.2)
Prothrombin Time: 15.6 seconds — ABNORMAL HIGH (ref 11.4–15.2)

## 2020-09-11 MED ORDER — IPRATROPIUM-ALBUTEROL 0.5-2.5 (3) MG/3ML IN SOLN
3.0000 mL | Freq: Two times a day (BID) | RESPIRATORY_TRACT | Status: DC
  Administered 2020-09-11 – 2020-09-12 (×2): 3 mL via RESPIRATORY_TRACT
  Filled 2020-09-11 (×2): qty 3

## 2020-09-11 MED ORDER — APIXABAN 5 MG PO TABS
5.0000 mg | ORAL_TABLET | Freq: Two times a day (BID) | ORAL | Status: DC
  Administered 2020-09-11 – 2020-09-12 (×2): 5 mg via ORAL
  Filled 2020-09-11 (×2): qty 1

## 2020-09-11 NOTE — Plan of Care (Signed)
Continue with current plan of care.

## 2020-09-11 NOTE — Progress Notes (Signed)
Patient is out of bed in chair and declines to return to bed even for 15 minutes to place PIV.  Explained to patient that it is extremely difficult to place IV while in chair against wall and ultrasound would not be able to be used.  Patient stated that it was too uncomfortable to get in the bed.  Crystal,RN updated and stated that IV could be started once he went back to bed.  IV consult canceled and to be reordered once he is ready.

## 2020-09-11 NOTE — Care Management Important Message (Signed)
Important Message  Patient Details  Name: Aaron Ball MRN: [id number] Date of Birth: 30-Aug-1951   Medicare Important Message Given:  Yes     Aaron Ball 09/11/2020, 12:59 PM

## 2020-09-11 NOTE — Progress Notes (Signed)
PROGRESS NOTE    Aaron Ball  GEX:[id number] DOB: 17-Apr-1951 DOA: 09/08/2020 PCP: [facility]    Assessment & Plan:   Active Problems:   Hyperlipidemia   HTN (hypertension)   OSA (obstructive sleep apnea)   COPD (chronic obstructive pulmonary disease) (HCC)   Gout   Diabetes mellitus without complication (HCC)   Acute on chronic diastolic CHF (congestive heart failure) (HCC)   Iron deficiency anemia   Acute on chronic diastolic heart failure (HCC)    Aaron Ball is a 69 y.o. male with medical history significant of A. fib on Coumadin, diastolic CHF, COPD on chronic 2 L nasal cannula, CAD status post stents, diabetes, hypertension, hyperlipidemia, OSA, morbid obesity who presents for evaluation of shortness of breath. He was admitted 11/23-11/27/21 for respiratory failure 2/2 HFpEF. Patient endorses progressively worsening shortness of breath over the last 48 hours  which became severe and constant this evening. Reports subjective fever and cough.    1. Acute on Chronic respiratory failure 2/2 HFpEF and COPD Chronic respiratory failure on home 2L baseline --O2 weaned down to 2L. --Continue supplemental O2 to keep sats between 88-92%, wean as tolerated  Acute on chronic HFpEF --started on IV lasix 40 mg q6 x3 on admission Plan: --Hold diuretic and home Lisinopril due to Cr increase  2. Acute on chronic COPD exacerbation - was wheezing at admission to ED but clear now. Did receive solumedrol IV, then transitioned to prednisone. Plan: --cont prednisone 40 mg daily --scheduled DuoNeb  3. DM  --A1c 4.1 --Hold home oral agent --cont SSI 2/2 steroid  4. OSA on home CPAP --continue CPAP nightly  5. HTN - Hold home Lisinopril due to Cr increase  6. Gout  - cont home allopurinol 400 mg daily  7. Iron deficiency anemia  - Hgb 7-8 --cont home iron supplement --monitor and transfuse to keep Hgb >7  # Chronic back pain --cont home tramadol  and gabapentin --no increase in narcotics  Afib on Eliquis --hold home sotalol due to low HR --cont Eliquis (pt was switched to Eliquis from warfarin)  # AKI, not POA --Cr 1.07 on presentation, went up to 1.53 this morning, likely due to over-diuresis --Cr improved with LR@100  for 10 hours --Hold further IVF and diuretics.   DVT prophylaxis: [id number] Code Status: Full code  Family Communication:  Status is: inpatient Dispo:   The patient is from: home Anticipated d/c is to: home Anticipated d/c date is: tomorrow   Subjective and Interval History:  Pt reported feeling better.  Good urine output.  Reported some congestion and diarrhea.   Objective: Vitals:   09/11/20 2324 09/12/20 0359 09/12/20 0817 09/12/20 0835  BP: (!) 154/67 (!) 162/76  138/80  Pulse: 63 (!) 58  73  Resp: 17 20  20   Temp: (!) 97.5 F (36.4 C) (!) 97.5 F (36.4 C)  (!) 97.5 F (36.4 C)  TempSrc: Oral Oral  Oral  SpO2: 99% 95% 100% 100%  Weight:      Height:       No intake or output data in the 24 hours ending 09/15/20 1735 Filed Weights   09/08/20 0606 09/09/20 0527 09/10/20 0404  Weight: 136.1 kg (!) 139.9 kg (!) 139.8 kg    Examination:   Constitutional: NAD, AAOx3 HEENT: conjunctivae and lids normal, EOMI CV: No cyanosis.   RESP: clear, on 2L Extremities: chronic edema in BLE SKIN: warm, dry and intact Neuro: II - XII grossly intact.   Psych: Normal  mood and affect.  Appropriate judgement and reason   Data Reviewed: I have personally reviewed following labs and imaging studies  CBC: Recent Labs  Lab 09/10/20 0612 09/11/20 0442 09/12/20 0436  WBC 9.0 8.9 8.3  HGB 7.8* 8.1* 8.2*  HCT 27.0* 27.7* 28.6*  MCV 95.1 93.9 94.7  PLT 335 356 358   Basic Metabolic Panel: Recent Labs  Lab 09/09/20 0417 09/10/20 0612 09/11/20 0442 09/12/20 0436  NA 141 142 142 141  K 3.9 4.2 3.9 3.8  CL 100 102 101 103  CO2 29 32 32 31  GLUCOSE 133* 78 90 110*  BUN 31* 43* 33* 24*   CREATININE 1.33* 1.53* 1.21 1.07  CALCIUM 9.1 9.2 8.9 9.0  MG  --  2.6* 2.4 2.3   GFR: Estimated Creatinine Clearance: 86.8 mL/min (by C-G formula based on SCr of 1.07 mg/dL). Liver Function Tests: No results for input(s): AST, ALT, ALKPHOS, BILITOT, PROT, ALBUMIN in the last 168 hours. No results for input(s): LIPASE, AMYLASE in the last 168 hours. No results for input(s): AMMONIA in the last 168 hours. Coagulation Profile: Recent Labs  Lab 09/09/20 0417 09/10/20 0612 09/11/20 0442  INR 1.6* 1.4* 1.3*   Cardiac Enzymes: No results for input(s): CKTOTAL, CKMB, CKMBINDEX, TROPONINI in the last 168 hours. BNP (last 3 results) No results for input(s): PROBNP in the last 8760 hours. HbA1C: No results for input(s): HGBA1C in the last 72 hours. CBG: Recent Labs  Lab 09/11/20 0737 09/11/20 1151 09/11/20 1627 09/11/20 2128 09/12/20 0821  GLUCAP 73 123* 201* 157* 82   Lipid Profile: No results for input(s): CHOL, HDL, LDLCALC, TRIG, CHOLHDL, LDLDIRECT in the last 72 hours. Thyroid Function Tests: No results for input(s): TSH, T4TOTAL, FREET4, T3FREE, THYROIDAB in the last 72 hours. Anemia Panel: No results for input(s): VITAMINB12, FOLATE, FERRITIN, TIBC, IRON, RETICCTPCT in the last 72 hours. Sepsis Labs: No results for input(s): PROCALCITON, LATICACIDVEN in the last 168 hours.  Recent Results (from the past 240 hour(s))  Blood culture (routine x 2)     Status: None   Collection Time: 09/08/20  6:11 AM   Specimen: BLOOD  Result Value Ref Range Status   Specimen Description BLOOD RIGHT ANTECUBITAL  Final   Special Requests   Final    BOTTLES DRAWN AEROBIC AND ANAEROBIC Blood Culture adequate volume   Culture   Final    NO GROWTH 5 DAYS Performed at [facility], [address] [place], [place] [address]    Report Status 09/13/2020 FINAL  Final  Resp Panel by RT-PCR (Flu A&B, Covid) Nasopharyngeal Swab     Status: None   Collection Time: 09/08/20  6:12 AM    Specimen: Nasopharyngeal Swab; Nasopharyngeal(NP) swabs in vial transport medium  Result Value Ref Range Status   SARS Coronavirus 2 by RT PCR NEGATIVE NEGATIVE Final    Comment: (NOTE) SARS-CoV-2 target nucleic acids are NOT DETECTED.  The SARS-CoV-2 RNA is generally detectable in upper respiratory specimens during the acute phase of infection. The lowest concentration of SARS-CoV-2 viral copies this assay can detect is 138 copies/mL. A negative result does not preclude SARS-Cov-2 infection and should not be used as the sole basis for treatment or other patient management decisions. A negative result may occur with  improper specimen collection/handling, submission of specimen other than nasopharyngeal swab, presence of viral mutation(s) within the areas targeted by this assay, and inadequate number of viral copies(<138 copies/mL). A negative result must be combined with clinical observations, patient  history, and epidemiological information. The expected result is Negative.  Fact Sheet for Patients:  BloggerCourse.com  Fact Sheet for Healthcare Providers:  SeriousBroker.it  This test is no t yet approved or cleared by the [place] FDA and  has been authorized for detection and/or diagnosis of SARS-CoV-2 by FDA under an Emergency Use Authorization (EUA). This EUA will remain  in effect (meaning this test can be used) for the duration of the COVID-19 declaration under Section 564(b)(1) of the Act, 21 U.S.C.section 360bbb-3(b)(1), unless the authorization is terminated  or revoked sooner.       Influenza A by PCR NEGATIVE NEGATIVE Final   Influenza B by PCR NEGATIVE NEGATIVE Final    Comment: (NOTE) The Xpert Xpress SARS-CoV-2/FLU/RSV plus assay is intended as an aid in the diagnosis of influenza from Nasopharyngeal swab specimens and should not be used as a sole basis for treatment. Nasal washings and aspirates are  unacceptable for Xpert Xpress SARS-CoV-2/FLU/RSV testing.  Fact Sheet for Patients: BloggerCourse.com  Fact Sheet for Healthcare Providers: SeriousBroker.it  This test is not yet approved or cleared by the [place] FDA and has been authorized for detection and/or diagnosis of SARS-CoV-2 by FDA under an Emergency Use Authorization (EUA). This EUA will remain in effect (meaning this test can be used) for the duration of the COVID-19 declaration under Section 564(b)(1) of the Act, 21 U.S.C. section 360bbb-3(b)(1), unless the authorization is terminated or revoked.  Performed at [facility], [address] [place], [place] [address]   Blood culture (routine x 2)     Status: None   Collection Time: 09/08/20  6:16 AM   Specimen: BLOOD  Result Value Ref Range Status   Specimen Description BLOOD BLOOD LEFT FOREARM  Final   Special Requests   Final    BOTTLES DRAWN AEROBIC AND ANAEROBIC Blood Culture results may not be optimal due to an excessive volume of blood received in culture bottles   Culture   Final    NO GROWTH 5 DAYS Performed at [facility], [address], [place], [place] [address]    Report Status 09/13/2020 FINAL  Final      Radiology Studies: No results found.   Scheduled Meds:  Continuous Infusions:    LOS: 4 days     Darlin Priestly, MD [facility] If 7PM-7AM, please contact night-coverage 09/15/2020, 5:35 PM

## 2020-09-12 ENCOUNTER — Other Ambulatory Visit: Payer: Self-pay

## 2020-09-12 ENCOUNTER — Encounter: Payer: Self-pay | Admitting: Internal Medicine

## 2020-09-12 DIAGNOSIS — J441 Chronic obstructive pulmonary disease with (acute) exacerbation: Secondary | ICD-10-CM | POA: Diagnosis not present

## 2020-09-12 LAB — CBC
HCT: 28.6 % — ABNORMAL LOW (ref 39.0–52.0)
Hemoglobin: 8.2 g/dL — ABNORMAL LOW (ref 13.0–17.0)
MCH: 27.2 pg (ref 26.0–34.0)
MCHC: 28.7 g/dL — ABNORMAL LOW (ref 30.0–36.0)
MCV: 94.7 fL (ref 80.0–100.0)
Platelets: 358 10*3/uL (ref 150–400)
RBC: 3.02 MIL/uL — ABNORMAL LOW (ref 4.22–5.81)
RDW: 23.8 % — ABNORMAL HIGH (ref 11.5–15.5)
WBC: 8.3 10*3/uL (ref 4.0–10.5)
nRBC: 0 % (ref 0.0–0.2)

## 2020-09-12 LAB — BASIC METABOLIC PANEL
Anion gap: 7 (ref 5–15)
BUN: 24 mg/dL — ABNORMAL HIGH (ref 8–23)
CO2: 31 mmol/L (ref 22–32)
Calcium: 9 mg/dL (ref 8.9–10.3)
Chloride: 103 mmol/L (ref 98–111)
Creatinine, Ser: 1.07 mg/dL (ref 0.61–1.24)
GFR, Estimated: >60 mL/min (ref >60)
Glucose, Bld: 110 mg/dL — ABNORMAL HIGH (ref 70–99)
Potassium: 3.8 mmol/L (ref 3.5–5.1)
Sodium: 141 mmol/L (ref 135–145)

## 2020-09-12 LAB — MAGNESIUM: Magnesium: 2.3 mg/dL (ref 1.7–2.4)

## 2020-09-12 LAB — GLUCOSE, CAPILLARY: Glucose-Capillary: 82 mg/dL (ref 70–99)

## 2020-09-12 MED ORDER — ACETAMINOPHEN 500 MG PO TABS: 1000.0000 mg | ORAL_TABLET | Freq: Four times a day (QID) | ORAL | 0 refills | Status: AC | PRN

## 2020-09-12 MED ORDER — SOTALOL HCL 80 MG PO TABS: 80.0000 mg | ORAL_TABLET | Freq: Two times a day (BID) | ORAL | Status: AC

## 2020-09-12 MED ORDER — PREDNISONE 10 MG PO TABS: ORAL_TABLET | ORAL | 0 refills | Status: AC

## 2020-09-12 NOTE — Discharge Summary (Signed)
Physician Discharge Summary   Aaron Ball  male DOB: [birth year]  RKY:[id number]  PCP: [facility]  Admit date: 09/08/2020 Discharge date: 09/12/2020  Admitted From: home Disposition:  home Home Health: Yes  HH PT and RN CODE STATUS: Full code  Discharge Instructions    Discharge instructions   Complete by: As directed    You have been treated for COPD flare up, and have improved and back to your home 2 liters of oxygen.  Please finish prednisone taper as directed as home.  Your Sotalol dose has been reduced to 80 mg twice daily because your previous dose was causing your heart rate to be too low.  Your A1c is acutally lower than normal, so please stop taking glipizide.   Dr. Darlin Priestly [facility] Course:  For full details, please see H&P, progress notes, consult notes and ancillary notes.  Briefly,  Aaron Ball a 69 y.o.malewith medical history significant ofA. fib on eliquis, diastolic CHF, COPD on chronic 2 L nasal cannula, CAD status post stents, diabetes, hypertension, hyperlipidemia, OSA, morbid obesity who presented for evaluation of shortness of breath.  He was admitted 11/23-11/27/21 for respiratory failure 2/2 HFpEF.Patient endorses progressively worsening shortness of breath over thelast48 hourswhich became severe and constant this evening. Reported subjective fever and cough.    Acute on Chronic respiratory failure 2/2 HFpEF and COPD Chronic respiratory failure on home 2L baseline Per admitting physician, pt was on 5 liters O2 with sats ranging from 65% to 90%.  O2 weaned down to 2L prior to discharge.  Acute on chronic HFpEF CTA chest showed small bilateral effusions and possible mild interstitial edema.  Pt was started on IV lasix 40 mg q6 x3 on admission.  Pt had Cr increase the next day so further diuretic held.    Acute on chronic COPD exacerbation Pt was wheezing at admission to ED but clear now. Did  receive solumedrol IV, then transitioned to prednisone.  Pt also received DuoNeb.  Pt was discharged on prednisone taper.  Hx of DM  Episode of hypoglycemia A1c 4.1.  Glipizide was ordered on admission, which was d/c'ed since A1c is so low.  SSI ordered while inpatient due to steroid use.  Pt was discharged on no antiglycemics.    4. OSA on home CPAP continued CPAP nightly  5. HTN home Lisinopril held due to Cr increase, and resumed at discharge.  6. Gout  Continued home allopurinol 400 mg daily  7. Iron deficiency anemia  Hgb 7-8.  Continued home iron supplement.  # Chronic back pain Continued home tramadol and gabapentin.  Afib on Eliquis Continued home Eliquis (pt was switched to Eliquis from warfarin). Home sotalol 160 mg BID was ordered on admission, however, pt's HR dropped too low to 40's-50's, so sotalol was held.  At discharge, sotalol was reduced to 80 mg BID.  # AKI, not POA Cr 1.07 on presentation, went up to 1.53, likely due to over-diuresis.  Cr improved with LR@100  for 10 hours.  Cr 1.07 on the day of discharge.   Discharge Diagnoses:  Active Problems:   Hyperlipidemia   HTN (hypertension)   OSA (obstructive sleep apnea)   COPD (chronic obstructive pulmonary disease) (HCC)   Gout   Diabetes mellitus without complication (HCC)   Acute on chronic diastolic CHF (congestive heart failure) (HCC)   Iron deficiency anemia   Acute on chronic diastolic heart failure [facility])    Discharge Instructions:  Allergies as of 09/12/2020      Reactions   Erythromycin Hives, Anaphylaxis   Cyclobenzaprine Palpitations   Triggered a fib      Medication List    STOP taking these medications   amoxicillin 500 MG capsule Commonly known as: AMOXIL   glipiZIDE 5 MG 24 hr tablet Commonly known as: GLUCOTROL XL   warfarin 2.5 MG tablet Commonly known as: COUMADIN     TAKE these medications   acetaminophen 500 MG tablet Commonly known as: TYLENOL Take 2  tablets (1,000 mg total) by mouth every 6 (six) hours as needed. What changed:   when to take this  reasons to take this   albuterol 108 (90 Base) MCG/ACT inhaler Commonly known as: VENTOLIN HFA Inhale 2 puffs into the lungs every 6 (six) hours as needed for wheezing or shortness of breath.   allopurinol 300 MG tablet Commonly known as: ZYLOPRIM Take 300 mg by mouth daily. (take with 100mg  tablet to equal 400mg  total dose)   allopurinol 100 MG tablet Commonly known as: ZYLOPRIM Take 100 mg by mouth daily. (take with 300mg  tablet to equal 400mg  total dose)   ALPRAZolam 0.25 MG tablet Commonly known as: XANAX Take 0.25 mg by mouth 2 (two) times daily as needed for anxiety or sleep.   atorvastatin 40 MG tablet Commonly known as: LIPITOR Take 40 mg by mouth every evening.   colchicine 0.6 MG tablet Take 0.6-1.2 mg by mouth daily as needed (gout flare).   dextromethorphan-guaiFENesin 30-600 MG 12hr tablet Commonly known as: MUCINEX DM Take 1 tablet by mouth 2 (two) times daily as needed for cough.   Eliquis 5 MG Tabs tablet Generic drug: apixaban Take 5 mg by mouth 2 (two) times daily.   ferrous sulfate 325 (65 FE) MG tablet Take 1 tablet (325 mg total) by mouth 2 (two) times daily with a meal.   furosemide 20 MG tablet Commonly known as: Lasix Take 1 tablet (20 mg total) by mouth 2 (two) times daily.   gabapentin 300 MG capsule Commonly known as: NEURONTIN Take 900 mg by mouth at bedtime.   lisinopril 2.5 MG tablet Commonly known as: ZESTRIL Take 1 tablet (2.5 mg total) by mouth daily.   nitroGLYCERIN 0.4 MG SL tablet Commonly known as: NITROSTAT Place 0.4 mg under the tongue every 5 (five) minutes as needed for chest pain.   pantoprazole 40 MG tablet Commonly known as: PROTONIX Take 1 tablet (40 mg total) by mouth 2 (two) times daily.   predniSONE 10 MG tablet Commonly known as: DELTASONE Take 20 mg (2 tablets) from 1/2 to 1/4, then 10 mg (1 tablet) from  1/5 to 1/7, then done. Start taking on: September 13, 2020   sodium chloride 0.65 % Soln nasal spray Commonly known as: OCEAN Place 1 spray into both nostrils as needed for congestion.   sotalol 80 MG tablet Commonly known as: BETAPACE Take 1 tablet (80 mg total) by mouth 2 (two) times daily. This is a decrease from 160 mg twice daily. What changed:   how much to take  additional instructions   traMADol 50 MG tablet Commonly known as: ULTRAM Take 50 mg by mouth every 8 (eight) hours as needed for moderate pain.   traZODone 50 MG tablet Commonly known as: DESYREL Take 50 mg by mouth at bedtime as needed for sleep.        Follow-up Information    [facility] Follow up on 09/14/2020.  Specialty: Cardiology Why: at 2:30pm. Enter through the [place] entrance Contact information: [place], [facility]. Schedule an appointment as soon as possible for a visit in 1 week(s).   Specialty: Family Medicine Contact information: [address] [place] [address] [phone]               Allergies  Allergen Reactions  . Erythromycin Hives and Anaphylaxis  . Cyclobenzaprine Palpitations    Triggered a fib     The results of significant diagnostics from this hospitalization (including imaging, microbiology, ancillary and laboratory) are listed below for reference.   Consultations:   Procedures/Studies: CT ANGIO CHEST PE W OR WO CONTRAST  Result Date: 09/08/2020 CLINICAL DATA:  History of atrial fibrillation and congestive heart failure. Pulmonary embolism suspected. EXAM: CT ANGIOGRAPHY CHEST WITH CONTRAST TECHNIQUE: Multidetector CT imaging of the chest was performed using the standard protocol during bolus administration of intravenous contrast. Multiplanar CT image reconstructions and MIPs were obtained to evaluate the vascular anatomy.  CONTRAST:  169mL OMNIPAQUE IOHEXOL 350 MG/ML SOLN COMPARISON:  None. FINDINGS: Cardiovascular: Four-chamber cardiac enlargement. No pericardial effusion. Coronary artery calcification. Aortic atherosclerotic calcification. Pulmonary arterial opacification is moderate to good. No pulmonary emboli. Mediastinum/Nodes: No mass or lymphadenopathy. Lungs/Pleura: Small bilateral effusions layering dependently. Possible mild interstitial edema. No focal pulmonary finding. Upper Abdomen: Right renal cyst.  No other finding. Musculoskeletal: No acute or significant skeletal finding. Review of the MIP images confirms the above findings. IMPRESSION: 1. No pulmonary emboli. 2. Four-chamber cardiac enlargement. Small bilateral effusions layering dependently. Possible mild interstitial edema. Findings could relate to mild congestive heart failure. 3. Aortic atherosclerosis. Coronary artery calcification. Aortic Atherosclerosis (ICD10-I70.0). Electronically Signed   By: Nelson Chimes M.D.   On: 09/08/2020 14:32   DG Chest Portable 1 View  Result Date: 09/08/2020 CLINICAL DATA:  Shortness of breath. EXAM: PORTABLE CHEST 1 VIEW COMPARISON:  08/04/2020. FINDINGS: Cardiomegaly. Diffuse bilateral pulmonary interstitial prominence noted, increased from prior study of 08/04/2020. Findings most consistent CHF. Pneumonitis cannot be excluded. Low lung volumes with bibasilar atelectasis. Small bilateral pleural effusions cannot be excluded. No pneumothorax. Scoliosis and degenerative change thoracic spine. IMPRESSION: Cardiomegaly. Diffuse bilateral pulmonary interstitial prominence noted, increased from prior study of 08/04/2020. Findings most consistent CHF. Pneumonitis cannot be excluded. Small bilateral pleural effusions cannot be excluded. Electronically Signed   By: Marcello Moores  Register   On: 09/08/2020 06:43      Labs: BNP (last 3 results) Recent Labs    08/04/20 0844 09/08/20 0612  BNP 301.5* 856.3*   Basic Metabolic  Panel: Recent Labs  Lab 09/08/20 0612 09/09/20 0417 09/10/20 0612 09/11/20 0442 09/12/20 0436  NA 139 141 142 142 141  K 3.8 3.9 4.2 3.9 3.8  CL 102 100 102 101 103  CO2 28 29 32 32 31  GLUCOSE 99 133* 78 90 110*  BUN 25* 31* 43* 33* 24*  CREATININE 1.07 1.33* 1.53* 1.21 1.07  CALCIUM 8.7* 9.1 9.2 8.9 9.0  MG  --   --  2.6* 2.4 2.3   Liver Function Tests: Recent Labs  Lab 09/08/20 0612  AST 18  ALT 13  ALKPHOS 102  BILITOT 0.5  PROT 7.4  ALBUMIN 3.3*   No results for input(s): LIPASE, AMYLASE in the last 168 hours. No results for input(s): AMMONIA in the last 168 hours. CBC: Recent Labs  Lab 09/08/20 0612 09/10/20 0612 09/11/20 1497  09/12/20 0436  WBC 7.3 9.0 8.9 8.3  NEUTROABS 5.6  --   --   --   HGB 7.8* 7.8* 8.1* 8.2*  HCT 27.5* 27.0* 27.7* 28.6*  MCV 95.5 95.1 93.9 94.7  PLT 258 335 356 358   Cardiac Enzymes: No results for input(s): CKTOTAL, CKMB, CKMBINDEX, TROPONINI in the last 168 hours. BNP: Invalid input(s): POCBNP CBG: Recent Labs  Lab 09/11/20 0737 09/11/20 1151 09/11/20 1627 09/11/20 2128 09/12/20 0821  GLUCAP 73 123* 201* 157* 82   D-Dimer No results for input(s): DDIMER in the last 72 hours. Hgb A1c No results for input(s): HGBA1C in the last 72 hours. Lipid Profile No results for input(s): CHOL, HDL, LDLCALC, TRIG, CHOLHDL, LDLDIRECT in the last 72 hours. Thyroid function studies No results for input(s): TSH, T4TOTAL, T3FREE, THYROIDAB in the last 72 hours.  Invalid input(s): FREET3 Anemia work up No results for input(s): VITAMINB12, FOLATE, FERRITIN, TIBC, IRON, RETICCTPCT in the last 72 hours. Urinalysis    Component Value Date/Time   COLORURINE STRAW (A) 08/20/2015 1410   APPEARANCEUR CLEAR (A) 08/20/2015 1410   APPEARANCEUR Cloudy 01/31/2012 1220   LABSPEC 1.012 08/20/2015 1410   LABSPEC 1.019 01/31/2012 1220   PHURINE 7.0 08/20/2015 1410   GLUCOSEU NEGATIVE 08/20/2015 1410   GLUCOSEU Negative 01/31/2012 1220    HGBUR NEGATIVE 08/20/2015 1410   BILIRUBINUR NEGATIVE 08/20/2015 1410   BILIRUBINUR Negative 01/31/2012 1220   KETONESUR NEGATIVE 08/20/2015 1410   PROTEINUR NEGATIVE 08/20/2015 1410   NITRITE NEGATIVE 08/20/2015 1410   LEUKOCYTESUR NEGATIVE 08/20/2015 1410   LEUKOCYTESUR 3+ 01/31/2012 1220   Sepsis Labs Invalid input(s): PROCALCITONIN,  WBC,  LACTICIDVEN Microbiology Recent Results (from the past 240 hour(s))  Blood culture (routine x 2)     Status: None (Preliminary result)   Collection Time: 09/08/20  6:11 AM   Specimen: BLOOD  Result Value Ref Range Status   Specimen Description BLOOD RIGHT ANTECUBITAL  Final   Special Requests   Final    BOTTLES DRAWN AEROBIC AND ANAEROBIC Blood Culture adequate volume   Culture   Final    NO GROWTH 4 DAYS Performed at [facility], [address], [place], [place] [address]    Report Status PENDING  Incomplete  Resp Panel by RT-PCR (Flu A&B, Covid) Nasopharyngeal Swab     Status: None   Collection Time: 09/08/20  6:12 AM   Specimen: Nasopharyngeal Swab; Nasopharyngeal(NP) swabs in vial transport medium  Result Value Ref Range Status   SARS Coronavirus 2 by RT PCR NEGATIVE NEGATIVE Final    Comment: (NOTE) SARS-CoV-2 target nucleic acids are NOT DETECTED.  The SARS-CoV-2 RNA is generally detectable in upper respiratory specimens during the acute phase of infection. The lowest concentration of SARS-CoV-2 viral copies this assay can detect is 138 copies/mL. A negative result does not preclude SARS-Cov-2 infection and should not be used as the sole basis for treatment or other patient management decisions. A negative result may occur with  improper specimen collection/handling, submission of specimen other than nasopharyngeal swab, presence of viral mutation(s) within the areas targeted by this assay, and inadequate number of viral copies(<138 copies/mL). A negative result must be combined with clinical observations, patient  history, and epidemiological information. The expected result is Negative.  Fact Sheet for Patients:  BloggerCourse.com  Fact Sheet for Healthcare Providers:  SeriousBroker.it  This test is no t yet approved or cleared by the [place] FDA and  has been authorized for detection and/or diagnosis of SARS-CoV-2  by FDA under an Emergency Use Authorization (EUA). This EUA will remain  in effect (meaning this test can be used) for the duration of the COVID-19 declaration under Section 564(b)(1) of the Act, 21 U.S.C.section 360bbb-3(b)(1), unless the authorization is terminated  or revoked sooner.       Influenza A by PCR NEGATIVE NEGATIVE Final   Influenza B by PCR NEGATIVE NEGATIVE Final    Comment: (NOTE) The Xpert Xpress SARS-CoV-2/FLU/RSV plus assay is intended as an aid in the diagnosis of influenza from Nasopharyngeal swab specimens and should not be used as a sole basis for treatment. Nasal washings and aspirates are unacceptable for Xpert Xpress SARS-CoV-2/FLU/RSV testing.  Fact Sheet for Patients: BloggerCourse.com  Fact Sheet for Healthcare Providers: SeriousBroker.it  This test is not yet approved or cleared by the [place] FDA and has been authorized for detection and/or diagnosis of SARS-CoV-2 by FDA under an Emergency Use Authorization (EUA). This EUA will remain in effect (meaning this test can be used) for the duration of the COVID-19 declaration under Section 564(b)(1) of the Act, 21 U.S.C. section 360bbb-3(b)(1), unless the authorization is terminated or revoked.  Performed at [facility], [address] [place], [place] [address]   Blood culture (routine x 2)     Status: None (Preliminary result)   Collection Time: 09/08/20  6:16 AM   Specimen: BLOOD  Result Value Ref Range Status   Specimen Description BLOOD BLOOD LEFT FOREARM  Final    Special Requests   Final    BOTTLES DRAWN AEROBIC AND ANAEROBIC Blood Culture results may not be optimal due to an excessive volume of blood received in culture bottles   Culture   Final    NO GROWTH 4 DAYS Performed at [facility], [address] [place], [place] [address]    Report Status PENDING  Incomplete     Total time spend on discharging this patient, including the last patient exam, discussing the hospital stay, instructions for ongoing care as it relates to all pertinent caregivers, as well as preparing the medical discharge records, prescriptions, and/or referrals as applicable, is 40 minutes.    Darlin Priestly, MD  [facility] 09/12/2020, 10:51 AM

## 2020-09-12 NOTE — Progress Notes (Deleted)
   Patient ID: Aaron Ball, male    DOB: November 22, 1950, 70 y.o.   MRN: [id number]  HPI  Aaron Ball is a 70 y/o male with a history of  Echo report from 08/04/20 reviewed and showed an EF of 60-65% without structural changes.   Last cath was done 1999.   Admitted 09/08/20 due to shortness of breath. Initially given IV lasix with transition to oral diuretics. IV solumedrol given for wheezing and then changed to oral prednisone. Discharged after           He presents today for his initial visit with a chief complaint of   Review of Systems    Physical Exam  Assessment & Plan:  1: Chronic heart failure with preserved ejection fraction without structural changes- - NYHA class  2: HTN- - BP - saw PCP Ninetta Lights) 06/22/20  3: DM-  4: COPD-   5: Atrial fibrillation- - saw EP Graciela Husbands) 05/26/20

## 2020-09-12 NOTE — Progress Notes (Signed)
Aaron Ball to be D/C'd home per MD order.  Discussed prescriptions and follow up appointments with the patient. Prescriptions given to patient, medication list explained in detail. Pt verbalized understanding.  Allergies as of 09/12/2020       Reactions   Erythromycin Hives, Anaphylaxis   Cyclobenzaprine Palpitations   Triggered a fib        Medication List     STOP taking these medications    amoxicillin 500 MG capsule Commonly known as: AMOXIL   glipiZIDE 5 MG 24 hr tablet Commonly known as: GLUCOTROL XL   warfarin 2.5 MG tablet Commonly known as: COUMADIN       TAKE these medications    acetaminophen 500 MG tablet Commonly known as: TYLENOL Take 2 tablets (1,000 mg total) by mouth every 6 (six) hours as needed. What changed:  when to take this reasons to take this   albuterol 108 (90 Base) MCG/ACT inhaler Commonly known as: VENTOLIN HFA Inhale 2 puffs into the lungs every 6 (six) hours as needed for wheezing or shortness of breath.   allopurinol 300 MG tablet Commonly known as: ZYLOPRIM Take 300 mg by mouth daily. (take with 100mg  tablet to equal 400mg  total dose)   allopurinol 100 MG tablet Commonly known as: ZYLOPRIM Take 100 mg by mouth daily. (take with 300mg  tablet to equal 400mg  total dose)   ALPRAZolam 0.25 MG tablet Commonly known as: XANAX Take 0.25 mg by mouth 2 (two) times daily as needed for anxiety or sleep.   atorvastatin 40 MG tablet Commonly known as: LIPITOR Take 40 mg by mouth every evening.   colchicine 0.6 MG tablet Take 0.6-1.2 mg by mouth daily as needed (gout flare).   dextromethorphan-guaiFENesin 30-600 MG 12hr tablet Commonly known as: MUCINEX DM Take 1 tablet by mouth 2 (two) times daily as needed for cough.   Eliquis 5 MG Tabs tablet Generic drug: apixaban Take 5 mg by mouth 2 (two) times daily.   ferrous sulfate 325 (65 FE) MG tablet Take 1 tablet (325 mg total) by mouth 2 (two) times daily with a meal.    furosemide 20 MG tablet Commonly known as: Lasix Take 1 tablet (20 mg total) by mouth 2 (two) times daily.   gabapentin 300 MG capsule Commonly known as: NEURONTIN Take 900 mg by mouth at bedtime.   lisinopril 2.5 MG tablet Commonly known as: ZESTRIL Take 1 tablet (2.5 mg total) by mouth daily.   nitroGLYCERIN 0.4 MG SL tablet Commonly known as: NITROSTAT Place 0.4 mg under the tongue every 5 (five) minutes as needed for chest pain.   pantoprazole 40 MG tablet Commonly known as: PROTONIX Take 1 tablet (40 mg total) by mouth 2 (two) times daily.   predniSONE 10 MG tablet Commonly known as: DELTASONE Take 20 mg (2 tablets) from 1/2 to 1/4, then 10 mg (1 tablet) from 1/5 to 1/7, then done. Start taking on: September 13, 2020   sodium chloride 0.65 % Soln nasal spray Commonly known as: OCEAN Place 1 spray into both nostrils as needed for congestion.   sotalol 80 MG tablet Commonly known as: BETAPACE Take 1 tablet (80 mg total) by mouth 2 (two) times daily. This is a decrease from 160 mg twice daily. What changed:  how much to take additional instructions   traMADol 50 MG tablet Commonly known as: ULTRAM Take 50 mg by mouth every 8 (eight) hours as needed for moderate pain.   traZODone 50 MG tablet Commonly known as:  DESYREL Take 50 mg by mouth at bedtime as needed for sleep.        Vitals:   09/12/20 0817 09/12/20 0835  BP:  138/80  Pulse:  73  Resp:  20  Temp:  (!) 97.5 F (36.4 C)  SpO2: 100% 100%    Skin clean, dry and intact without evidence of skin break down, no evidence of skin tears noted. IV catheter discontinued intact. Site without signs and symptoms of complications. Dressing and pressure applied. Pt denies pain at this time. No complaints noted.  An After Visit Summary was printed and given to the patient. Patient escorted via WC, and D/C home via private auto.  Aaron Ball A Aaron Ball

## 2020-09-13 LAB — CULTURE, BLOOD (ROUTINE X 2)
Culture: NO GROWTH
Culture: NO GROWTH
Special Requests: ADEQUATE

## 2020-09-14 ENCOUNTER — Ambulatory Visit: Payer: Medicare Other | Admitting: Family

## 2020-09-14 NOTE — TOC Progression Note (Signed)
Transition of Care [facility]) - Progression Note    Patient Details  Name: Aaron Ball MRN: [id number] Date of Birth: 1951-08-29  Transition of Care [facility]) CM/SW Contact  Marina Goodell Phone Number: [phone] 09/14/2020, 1:32 PM  Clinical Narrative:     [facility] reached out to CSW requesting an addendum to d/c summary to include home health RN.  CSW sent secure chat to Dr. Fran Lowes requesting addendum.   Expected Discharge Plan: Skilled Nursing Facility Barriers to Discharge: Continued Medical Work up,SNF Pending bed offer  Expected Discharge Plan and Services Expected Discharge Plan: Skilled Nursing Facility In-house Referral: Clinical Social Work   Post Acute Care Choice: Skilled Nursing Facility Living arrangements for the past 2 months: Single Family Home Expected Discharge Date: 09/12/20                                     Social Determinants of Health (SDOH) Interventions    Readmission Risk Interventions Readmission Risk Prevention Plan 08/07/2020  Transportation Screening Complete  HRI or Home Care Consult Complete  Palliative Care Screening Not Applicable  Medication Review (RN Care Manager) Complete  Some recent data might be hidden

## 2020-09-24 ENCOUNTER — Ambulatory Visit: Payer: Medicare Other | Admitting: Family

## 2020-10-01 ENCOUNTER — Ambulatory Visit: Payer: Medicare Other | Admitting: Podiatry

## 2020-10-15 ENCOUNTER — Ambulatory Visit (INDEPENDENT_AMBULATORY_CARE_PROVIDER_SITE_OTHER): Payer: Medicare Other | Admitting: Podiatry

## 2020-10-15 ENCOUNTER — Encounter: Payer: Self-pay | Admitting: Podiatry

## 2020-10-15 DIAGNOSIS — E0843 Diabetes mellitus due to underlying condition with diabetic autonomic (poly)neuropathy: Secondary | ICD-10-CM | POA: Diagnosis not present

## 2020-10-15 DIAGNOSIS — N179 Acute kidney failure, unspecified: Secondary | ICD-10-CM | POA: Diagnosis not present

## 2020-10-15 DIAGNOSIS — M79675 Pain in left toe(s): Secondary | ICD-10-CM

## 2020-10-15 DIAGNOSIS — B351 Tinea unguium: Secondary | ICD-10-CM | POA: Diagnosis not present

## 2020-10-15 DIAGNOSIS — M79674 Pain in right toe(s): Secondary | ICD-10-CM

## 2020-10-15 NOTE — Progress Notes (Signed)
This patient returns to my office for at risk foot care.  This patient requires this care by a professional since this patient will be at risk due to having diabetes and coagulation defect due to eliquis. This patient is unable to cut nails himself since the patient cannot reach his nails.These nails are painful walking and wearing shoes.  This patient presents for at risk foot care today.  General Appearance  Alert, conversant and in no acute stress.  Vascular  Dorsalis pedis and posterior tibial  pulses are palpable  bilaterally.  Capillary return is within normal limits  bilaterally. Temperature is within normal limits  bilaterally.  Neurologic  Senn-Weinstein monofilament wire test within normal limits  bilaterally. Muscle power within normal limits bilaterally.  Nails Thick disfigured discolored nails with subungual debris  from hallux to fifth toes bilaterally. No evidence of bacterial infection or drainage bilaterally.  Orthopedic  No limitations of motion  feet .  No crepitus or effusions noted.  No bony pathology or digital deformities noted.  Skin  normotropic skin with no porokeratosis noted bilaterally.  No signs of infections or ulcers noted.     Onychomycosis  Pain in right toes  Pain in left toes  Consent was obtained for treatment procedures.   Mechanical debridement of nails 1-5  bilaterally performed with a nail nipper.  Filed with dremel without incident.    Return office visit   3 months                   Told patient to return for periodic foot care and evaluation due to potential at risk complications.   Dareth Andrew DPM  

## 2020-10-28 LAB — BLOOD GAS, VENOUS
Acid-Base Excess: 3.1 mmol/L — ABNORMAL HIGH (ref 0.0–2.0)
Bicarbonate: 29.3 mmol/L — ABNORMAL HIGH (ref 20.0–28.0)
O2 Saturation: 33.8 %
Patient temperature: 37
pCO2, Ven: 53 mmHg (ref 44.0–60.0)
pH, Ven: 7.35 (ref 7.250–7.430)
pO2, Ven: <31 mmHg — CL (ref 32.0–45.0)

## 2021-01-14 ENCOUNTER — Other Ambulatory Visit: Payer: Self-pay

## 2021-01-14 ENCOUNTER — Encounter: Payer: Self-pay | Admitting: Podiatry

## 2021-01-14 ENCOUNTER — Ambulatory Visit (INDEPENDENT_AMBULATORY_CARE_PROVIDER_SITE_OTHER): Payer: Medicare Other | Admitting: Podiatry

## 2021-01-14 DIAGNOSIS — M79674 Pain in right toe(s): Secondary | ICD-10-CM

## 2021-01-14 DIAGNOSIS — B351 Tinea unguium: Secondary | ICD-10-CM | POA: Diagnosis not present

## 2021-01-14 DIAGNOSIS — E0843 Diabetes mellitus due to underlying condition with diabetic autonomic (poly)neuropathy: Secondary | ICD-10-CM

## 2021-01-14 DIAGNOSIS — N179 Acute kidney failure, unspecified: Secondary | ICD-10-CM

## 2021-01-14 DIAGNOSIS — M79675 Pain in left toe(s): Secondary | ICD-10-CM | POA: Diagnosis not present

## 2021-01-14 NOTE — Progress Notes (Signed)
This patient returns to my office for at risk foot care.  This patient requires this care by a professional since this patient will be at risk due to having diabetes and coagulation defect due to eliquis. This patient is unable to cut nails himself since the patient cannot reach his nails.These nails are painful walking and wearing shoes.  This patient presents for at risk foot care today.  General Appearance  Alert, conversant and in no acute stress.  Vascular  Dorsalis pedis and posterior tibial  pulses are palpable  bilaterally.  Capillary return is within normal limits  bilaterally. Temperature is within normal limits  bilaterally.  Neurologic  Senn-Weinstein monofilament wire test within normal limits  bilaterally. Muscle power within normal limits bilaterally.  Nails Thick disfigured discolored nails with subungual debris  from hallux to fifth toes bilaterally. No evidence of bacterial infection or drainage bilaterally.  Orthopedic  No limitations of motion  feet .  No crepitus or effusions noted.  No bony pathology or digital deformities noted.  Skin  normotropic skin with no porokeratosis noted bilaterally.  No signs of infections or ulcers noted.     Onychomycosis  Pain in right toes  Pain in left toes  Consent was obtained for treatment procedures.   Mechanical debridement of nails 1-5  bilaterally performed with a nail nipper.  Filed with dremel without incident.    Return office visit   3 months                   Told patient to return for periodic foot care and evaluation due to potential at risk complications.   Braelon Sprung DPM  

## 2021-04-22 ENCOUNTER — Encounter: Payer: Self-pay | Admitting: Podiatry

## 2021-04-22 ENCOUNTER — Ambulatory Visit (INDEPENDENT_AMBULATORY_CARE_PROVIDER_SITE_OTHER): Payer: Medicare Other | Admitting: Podiatry

## 2021-04-22 ENCOUNTER — Other Ambulatory Visit: Payer: Self-pay

## 2021-04-22 DIAGNOSIS — B351 Tinea unguium: Secondary | ICD-10-CM

## 2021-04-22 DIAGNOSIS — M79675 Pain in left toe(s): Secondary | ICD-10-CM

## 2021-04-22 DIAGNOSIS — E0843 Diabetes mellitus due to underlying condition with diabetic autonomic (poly)neuropathy: Secondary | ICD-10-CM

## 2021-04-22 DIAGNOSIS — M79674 Pain in right toe(s): Secondary | ICD-10-CM

## 2021-04-22 DIAGNOSIS — N179 Acute kidney failure, unspecified: Secondary | ICD-10-CM | POA: Diagnosis not present

## 2021-04-22 NOTE — Progress Notes (Signed)
This patient returns to my office for at risk foot care.  This patient requires this care by a professional since this patient will be at risk due to having diabetes and coagulation defect due to eliquis. This patient is unable to cut nails himself since the patient cannot reach his nails.These nails are painful walking and wearing shoes.  This patient presents for at risk foot care today.  General Appearance  Alert, conversant and in no acute stress.  Vascular  Dorsalis pedis and posterior tibial  pulses are palpable  bilaterally.  Capillary return is within normal limits  bilaterally. Temperature is within normal limits  bilaterally.  Neurologic  Senn-Weinstein monofilament wire test within normal limits  bilaterally. Muscle power within normal limits bilaterally.  Nails Thick disfigured discolored nails with subungual debris  from hallux to fifth toes bilaterally. No evidence of bacterial infection or drainage bilaterally.  Orthopedic  No limitations of motion  feet .  No crepitus or effusions noted.  No bony pathology or digital deformities noted.  Skin  normotropic skin with no porokeratosis noted bilaterally.  No signs of infections or ulcers noted.     Onychomycosis  Pain in right toes  Pain in left toes  Consent was obtained for treatment procedures.   Mechanical debridement of nails 1-5  bilaterally performed with a nail nipper.  Filed with dremel without incident.    Return office visit   3 months                   Told patient to return for periodic foot care and evaluation due to potential at risk complications.   Aniayah Alaniz DPM  

## 2021-07-29 ENCOUNTER — Encounter: Payer: Self-pay | Admitting: Podiatry

## 2021-07-29 ENCOUNTER — Encounter (INDEPENDENT_AMBULATORY_CARE_PROVIDER_SITE_OTHER): Payer: Self-pay

## 2021-07-29 ENCOUNTER — Other Ambulatory Visit: Payer: Self-pay

## 2021-07-29 ENCOUNTER — Ambulatory Visit (INDEPENDENT_AMBULATORY_CARE_PROVIDER_SITE_OTHER): Payer: Medicare Other | Admitting: Podiatry

## 2021-07-29 DIAGNOSIS — M79674 Pain in right toe(s): Secondary | ICD-10-CM

## 2021-07-29 DIAGNOSIS — E0843 Diabetes mellitus due to underlying condition with diabetic autonomic (poly)neuropathy: Secondary | ICD-10-CM

## 2021-07-29 DIAGNOSIS — N179 Acute kidney failure, unspecified: Secondary | ICD-10-CM | POA: Diagnosis not present

## 2021-07-29 DIAGNOSIS — B351 Tinea unguium: Secondary | ICD-10-CM

## 2021-07-29 DIAGNOSIS — M79675 Pain in left toe(s): Secondary | ICD-10-CM | POA: Diagnosis not present

## 2021-07-29 NOTE — Progress Notes (Signed)
This patient returns to my office for at risk foot care.  This patient requires this care by a professional since this patient will be at risk due to having diabetes and coagulation defect due to eliquis. This patient is unable to cut nails himself since the patient cannot reach his nails.These nails are painful walking and wearing shoes.  This patient presents for at risk foot care today.  General Appearance  Alert, conversant and in no acute stress.  Vascular  Dorsalis pedis and posterior tibial  pulses are palpable  bilaterally.  Capillary return is within normal limits  bilaterally. Temperature is within normal limits  bilaterally.  Neurologic  Senn-Weinstein monofilament wire test within normal limits  bilaterally. Muscle power within normal limits bilaterally.  Nails Thick disfigured discolored nails with subungual debris  from hallux to fifth toes bilaterally. No evidence of bacterial infection or drainage bilaterally.  Orthopedic  No limitations of motion  feet .  No crepitus or effusions noted.  No bony pathology or digital deformities noted.  Skin  normotropic skin with no porokeratosis noted bilaterally.  No signs of infections or ulcers noted.     Onychomycosis  Pain in right toes  Pain in left toes  Consent was obtained for treatment procedures.   Mechanical debridement of nails 1-5  bilaterally performed with a nail nipper.  Filed with dremel without incident.    Return office visit   3 months                   Told patient to return for periodic foot care and evaluation due to potential at risk complications.   Kate Sweetman DPM  

## 2021-09-19 DIAGNOSIS — R778 Other specified abnormalities of plasma proteins: Secondary | ICD-10-CM | POA: Insufficient documentation

## 2021-11-11 ENCOUNTER — Other Ambulatory Visit: Payer: Self-pay

## 2021-11-11 ENCOUNTER — Ambulatory Visit (INDEPENDENT_AMBULATORY_CARE_PROVIDER_SITE_OTHER): Payer: Medicare Other | Admitting: Podiatry

## 2021-11-11 ENCOUNTER — Encounter: Payer: Self-pay | Admitting: Podiatry

## 2021-11-11 DIAGNOSIS — M79675 Pain in left toe(s): Secondary | ICD-10-CM | POA: Diagnosis not present

## 2021-11-11 DIAGNOSIS — B351 Tinea unguium: Secondary | ICD-10-CM

## 2021-11-11 DIAGNOSIS — E0843 Diabetes mellitus due to underlying condition with diabetic autonomic (poly)neuropathy: Secondary | ICD-10-CM

## 2021-11-11 DIAGNOSIS — N179 Acute kidney failure, unspecified: Secondary | ICD-10-CM

## 2021-11-11 DIAGNOSIS — M79674 Pain in right toe(s): Secondary | ICD-10-CM

## 2021-11-11 NOTE — Progress Notes (Signed)
This patient returns to my office for at risk foot care.  This patient requires this care by a professional since this patient will be at risk due to having diabetes and coagulation defect due to eliquis. This patient is unable to cut nails himself since the patient cannot reach his nails.These nails are painful walking and wearing shoes.  This patient presents for at risk foot care today.  General Appearance  Alert, conversant and in no acute stress.  Vascular  Dorsalis pedis and posterior tibial  pulses are palpable  bilaterally.  Capillary return is within normal limits  bilaterally. Temperature is within normal limits  bilaterally.  Neurologic  Senn-Weinstein monofilament wire test within normal limits  bilaterally. Muscle power within normal limits bilaterally.  Nails Thick disfigured discolored nails with subungual debris  from hallux to fifth toes bilaterally. No evidence of bacterial infection or drainage bilaterally.  Orthopedic  No limitations of motion  feet .  No crepitus or effusions noted.  No bony pathology or digital deformities noted.  Skin  normotropic skin with no porokeratosis noted bilaterally.  No signs of infections or ulcers noted.     Onychomycosis  Pain in right toes  Pain in left toes  Consent was obtained for treatment procedures.   Mechanical debridement of nails 1-5  bilaterally performed with a nail nipper.  Filed with dremel without incident.    Return office visit   3 months                   Told patient to return for periodic foot care and evaluation due to potential at risk complications.   Cindie Rajagopalan DPM  

## 2022-02-17 ENCOUNTER — Ambulatory Visit (INDEPENDENT_AMBULATORY_CARE_PROVIDER_SITE_OTHER): Payer: Medicare Other | Admitting: Podiatry

## 2022-02-17 ENCOUNTER — Encounter: Payer: Self-pay | Admitting: Podiatry

## 2022-02-17 DIAGNOSIS — B351 Tinea unguium: Secondary | ICD-10-CM | POA: Diagnosis not present

## 2022-02-17 DIAGNOSIS — N179 Acute kidney failure, unspecified: Secondary | ICD-10-CM | POA: Diagnosis not present

## 2022-02-17 DIAGNOSIS — E0843 Diabetes mellitus due to underlying condition with diabetic autonomic (poly)neuropathy: Secondary | ICD-10-CM | POA: Diagnosis not present

## 2022-02-17 DIAGNOSIS — M79674 Pain in right toe(s): Secondary | ICD-10-CM

## 2022-02-17 DIAGNOSIS — M79675 Pain in left toe(s): Secondary | ICD-10-CM | POA: Diagnosis not present

## 2022-02-17 NOTE — Progress Notes (Signed)
This patient returns to my office for at risk foot care.  This patient requires this care by a professional since this patient will be at risk due to having diabetes and coagulation defect due to eliquis. This patient is unable to cut nails himself since the patient cannot reach his nails.These nails are painful walking and wearing shoes.  This patient presents for at risk foot care today.  General Appearance  Alert, conversant and in no acute stress.  Vascular  Dorsalis pedis and posterior tibial  pulses are palpable  bilaterally.  Capillary return is within normal limits  bilaterally. Temperature is within normal limits  bilaterally.  Neurologic  Senn-Weinstein monofilament wire test within normal limits  bilaterally. Muscle power within normal limits bilaterally.  Nails Thick disfigured discolored nails with subungual debris  from hallux to fifth toes bilaterally. No evidence of bacterial infection or drainage bilaterally.  Orthopedic  No limitations of motion  feet .  No crepitus or effusions noted.  No bony pathology or digital deformities noted.  Skin  normotropic skin with no porokeratosis noted bilaterally.  No signs of infections or ulcers noted.     Onychomycosis  Pain in right toes  Pain in left toes  Consent was obtained for treatment procedures.   Mechanical debridement of nails 1-5  bilaterally performed with a nail nipper.  Filed with dremel without incident.    Return office visit   3 months                   Told patient to return for periodic foot care and evaluation due to potential at risk complications.   Nolah Krenzer DPM  

## 2022-05-26 ENCOUNTER — Ambulatory Visit (INDEPENDENT_AMBULATORY_CARE_PROVIDER_SITE_OTHER): Payer: Medicare Other | Admitting: Podiatry

## 2022-05-26 ENCOUNTER — Encounter: Payer: Self-pay | Admitting: Podiatry

## 2022-05-26 DIAGNOSIS — N179 Acute kidney failure, unspecified: Secondary | ICD-10-CM | POA: Diagnosis not present

## 2022-05-26 DIAGNOSIS — M79674 Pain in right toe(s): Secondary | ICD-10-CM

## 2022-05-26 DIAGNOSIS — B351 Tinea unguium: Secondary | ICD-10-CM

## 2022-05-26 DIAGNOSIS — E0843 Diabetes mellitus due to underlying condition with diabetic autonomic (poly)neuropathy: Secondary | ICD-10-CM

## 2022-05-26 DIAGNOSIS — M79675 Pain in left toe(s): Secondary | ICD-10-CM | POA: Diagnosis not present

## 2022-05-26 NOTE — Progress Notes (Signed)
This patient returns to my office for at risk foot care.  This patient requires this care by a professional since this patient will be at risk due to having diabetes and coagulation defect due to eliquis. This patient is unable to cut nails himself since the patient cannot reach his nails.These nails are painful walking and wearing shoes.  This patient presents for at risk foot care today.  General Appearance  Alert, conversant and in no acute stress.  Vascular  Dorsalis pedis and posterior tibial  pulses are palpable  bilaterally.  Capillary return is within normal limits  bilaterally. Temperature is within normal limits  bilaterally.  Neurologic  Senn-Weinstein monofilament wire test within normal limits  bilaterally. Muscle power within normal limits bilaterally.  Nails Thick disfigured discolored nails with subungual debris  from hallux to fifth toes bilaterally. No evidence of bacterial infection or drainage bilaterally.  Orthopedic  No limitations of motion  feet .  No crepitus or effusions noted.  No bony pathology or digital deformities noted.  Skin  normotropic skin with no porokeratosis noted bilaterally.  No signs of infections or ulcers noted.     Onychomycosis  Pain in right toes  Pain in left toes  Consent was obtained for treatment procedures.   Mechanical debridement of nails 1-5  bilaterally performed with a nail nipper.  Filed with dremel without incident.    Return office visit   3 months                   Told patient to return for periodic foot care and evaluation due to potential at risk complications.   Edge Mauger DPM  

## 2022-08-25 ENCOUNTER — Ambulatory Visit (INDEPENDENT_AMBULATORY_CARE_PROVIDER_SITE_OTHER): Payer: Medicare Other | Admitting: Podiatry

## 2022-08-25 ENCOUNTER — Encounter: Payer: Self-pay | Admitting: Podiatry

## 2022-08-25 DIAGNOSIS — M79675 Pain in left toe(s): Secondary | ICD-10-CM | POA: Diagnosis not present

## 2022-08-25 DIAGNOSIS — M79674 Pain in right toe(s): Secondary | ICD-10-CM

## 2022-08-25 DIAGNOSIS — B351 Tinea unguium: Secondary | ICD-10-CM

## 2022-08-25 DIAGNOSIS — N179 Acute kidney failure, unspecified: Secondary | ICD-10-CM

## 2022-08-25 DIAGNOSIS — E0843 Diabetes mellitus due to underlying condition with diabetic autonomic (poly)neuropathy: Secondary | ICD-10-CM

## 2022-08-25 NOTE — Progress Notes (Signed)
This patient returns to my office for at risk foot care.  This patient requires this care by a professional since this patient will be at risk due to having diabetes and coagulation defect due to eliquis. This patient is unable to cut nails himself since the patient cannot reach his nails.These nails are painful walking and wearing shoes.  This patient presents for at risk foot care today.  General Appearance  Alert, conversant and in no acute stress.  Vascular  Dorsalis pedis and posterior tibial  pulses are palpable  bilaterally.  Capillary return is within normal limits  bilaterally. Temperature is within normal limits  bilaterally.  Neurologic  Senn-Weinstein monofilament wire test within normal limits  bilaterally. Muscle power within normal limits bilaterally.  Nails Thick disfigured discolored nails with subungual debris  from hallux to fifth toes bilaterally. No evidence of bacterial infection or drainage bilaterally.  Orthopedic  No limitations of motion  feet .  No crepitus or effusions noted.  No bony pathology or digital deformities noted.  Skin  normotropic skin with no porokeratosis noted bilaterally.  No signs of infections or ulcers noted.     Onychomycosis  Pain in right toes  Pain in left toes  Consent was obtained for treatment procedures.   Mechanical debridement of nails 1-5  bilaterally performed with a nail nipper.  Filed with dremel without incident.    Return office visit   3 months                   Told patient to return for periodic foot care and evaluation due to potential at risk complications.   Helane Gunther DPM

## 2022-12-01 ENCOUNTER — Ambulatory Visit: Payer: Medicare Other | Admitting: Podiatry

## 2023-12-12 DEATH — deceased
